# Patient Record
Sex: Male | Born: 1960
Health system: Southern US, Community
[De-identification: ages and names within clinical notes are randomized; demographics above are authoritative.]

## PROBLEM LIST (undated history)

## (undated) DIAGNOSIS — Z87442 Personal history of urinary calculi: Secondary | ICD-10-CM

## (undated) DIAGNOSIS — T7840XA Allergy, unspecified, initial encounter: Secondary | ICD-10-CM

## (undated) DIAGNOSIS — I10 Essential (primary) hypertension: Secondary | ICD-10-CM

## (undated) DIAGNOSIS — E119 Type 2 diabetes mellitus without complications: Secondary | ICD-10-CM

## (undated) HISTORY — DX: Allergy, unspecified, initial encounter: T78.40XA

## (undated) HISTORY — DX: Type 2 diabetes mellitus without complications: E11.9

## (undated) HISTORY — DX: Essential (primary) hypertension: I10

## (undated) HISTORY — PX: APPENDECTOMY: SHX54

## (undated) HISTORY — PX: CHOLECYSTECTOMY: SHX55

---

## 2003-04-15 HISTORY — PX: SMALL INTESTINE SURGERY: SHX150

## 2004-10-09 ENCOUNTER — Inpatient Hospital Stay: Payer: Self-pay | Admitting: General Surgery

## 2004-11-01 ENCOUNTER — Ambulatory Visit: Payer: Self-pay | Admitting: General Surgery

## 2006-09-15 ENCOUNTER — Other Ambulatory Visit: Payer: Self-pay

## 2006-09-15 ENCOUNTER — Emergency Department: Payer: Self-pay | Admitting: Emergency Medicine

## 2007-03-29 ENCOUNTER — Emergency Department: Payer: Self-pay | Admitting: Emergency Medicine

## 2007-05-17 ENCOUNTER — Emergency Department: Payer: Self-pay

## 2009-02-14 ENCOUNTER — Emergency Department: Payer: Self-pay | Admitting: Emergency Medicine

## 2011-07-24 ENCOUNTER — Emergency Department: Payer: Self-pay | Admitting: Internal Medicine

## 2011-07-24 LAB — CBC
HGB: 16.2 g/dL (ref 13.0–18.0)
MCH: 29.1 pg (ref 26.0–34.0)
MCHC: 33.1 g/dL (ref 32.0–36.0)
MCV: 88 fL (ref 80–100)
RBC: 5.55 10*6/uL (ref 4.40–5.90)
RDW: 12.4 % (ref 11.5–14.5)

## 2011-07-24 LAB — BASIC METABOLIC PANEL
Anion Gap: 10 (ref 7–16)
Calcium, Total: 8.6 mg/dL (ref 8.5–10.1)
Co2: 26 mmol/L (ref 21–32)
Creatinine: 0.89 mg/dL (ref 0.60–1.30)
EGFR (Non-African Amer.): >60
Osmolality: 272 (ref 275–301)

## 2011-07-24 LAB — TROPONIN I: Troponin-I: <0.02 ng/mL

## 2012-09-27 ENCOUNTER — Emergency Department: Payer: Self-pay | Admitting: Emergency Medicine

## 2013-04-15 ENCOUNTER — Emergency Department: Payer: Self-pay | Admitting: Emergency Medicine

## 2013-07-26 ENCOUNTER — Emergency Department: Payer: Self-pay | Admitting: Emergency Medicine

## 2013-08-10 ENCOUNTER — Ambulatory Visit: Payer: Self-pay | Admitting: Family Medicine

## 2013-08-25 ENCOUNTER — Ambulatory Visit: Payer: Self-pay | Admitting: Physician Assistant

## 2014-04-26 ENCOUNTER — Emergency Department: Payer: Self-pay | Admitting: Emergency Medicine

## 2014-04-26 ENCOUNTER — Ambulatory Visit: Payer: Self-pay | Admitting: Family Medicine

## 2014-04-26 LAB — COMPREHENSIVE METABOLIC PANEL
ALBUMIN: 3.7 g/dL (ref 3.4–5.0)
AST: 48 U/L — AB (ref 15–37)
Alkaline Phosphatase: 58 U/L
Anion Gap: 4 — ABNORMAL LOW (ref 7–16)
BUN: 10 mg/dL (ref 7–18)
Bilirubin,Total: 0.4 mg/dL (ref 0.2–1.0)
CALCIUM: 9.1 mg/dL (ref 8.5–10.1)
Chloride: 105 mmol/L (ref 98–107)
Co2: 31 mmol/L (ref 21–32)
Creatinine: 0.84 mg/dL (ref 0.60–1.30)
EGFR (African American): >60
GLUCOSE: 96 mg/dL (ref 65–99)
OSMOLALITY: 278 (ref 275–301)
Potassium: 4.3 mmol/L (ref 3.5–5.1)
SGPT (ALT): 59 U/L
Sodium: 140 mmol/L (ref 136–145)
Total Protein: 7.8 g/dL (ref 6.4–8.2)

## 2014-04-26 LAB — CBC
HCT: 47.1 % (ref 40.0–52.0)
HGB: 15.6 g/dL (ref 13.0–18.0)
MCH: 30.4 pg (ref 26.0–34.0)
MCHC: 33.1 g/dL (ref 32.0–36.0)
MCV: 92 fL (ref 80–100)
Platelet: 226 10*3/uL (ref 150–440)
RBC: 5.12 10*6/uL (ref 4.40–5.90)
RDW: 13.1 % (ref 11.5–14.5)
WBC: 9.1 10*3/uL (ref 3.8–10.6)

## 2014-04-26 LAB — TROPONIN I

## 2014-04-26 LAB — CK TOTAL AND CKMB (NOT AT ARMC)
CK, Total: 41 U/L (ref 39–308)
CK-MB: <0.5 ng/mL — ABNORMAL LOW (ref 0.5–3.6)

## 2014-06-27 ENCOUNTER — Ambulatory Visit: Payer: Self-pay | Admitting: Emergency Medicine

## 2014-07-15 ENCOUNTER — Inpatient Hospital Stay
Admit: 2014-07-15 | Disposition: A | Discharge status: Home / Self Care | Payer: Self-pay | Attending: General Surgery | Admitting: General Surgery

## 2014-07-15 DIAGNOSIS — K565 Intestinal adhesions [bands] with obstruction (postprocedural) (postinfection): Secondary | ICD-10-CM

## 2014-07-15 LAB — COMPREHENSIVE METABOLIC PANEL
ALBUMIN: 4.8 g/dL
ALT: 66 U/L — AB
AST: 49 U/L — AB
Alkaline Phosphatase: 59 U/L
Anion Gap: 9 (ref 7–16)
BILIRUBIN TOTAL: 1.1 mg/dL
BUN: 17 mg/dL
Calcium, Total: 10 mg/dL
Chloride: 93 mmol/L — ABNORMAL LOW
Co2: 32 mmol/L
Creatinine: 0.89 mg/dL
EGFR (African American): >60
Glucose: 248 mg/dL — ABNORMAL HIGH
Potassium: 3 mmol/L — ABNORMAL LOW
SODIUM: 134 mmol/L — AB
Total Protein: 8.6 g/dL — ABNORMAL HIGH

## 2014-07-15 LAB — URINALYSIS, COMPLETE
BACTERIA: NONE SEEN
BILIRUBIN, UR: NEGATIVE
BLOOD: NEGATIVE
Ketone: NEGATIVE
Leukocyte Esterase: NEGATIVE
NITRITE: NEGATIVE
PH: 5 (ref 4.5–8.0)
Protein: NEGATIVE
RBC,UR: <1 /HPF (ref 0–5)
Specific Gravity: 1.04 (ref 1.003–1.030)
Squamous Epithelial: <1
WBC UR: 3 /HPF (ref 0–5)

## 2014-07-15 LAB — TROPONIN I: Troponin-I: <0.03 ng/mL

## 2014-07-15 LAB — CBC WITH DIFFERENTIAL/PLATELET
BASOS PCT: 0.2 %
Basophil #: 0 10*3/uL (ref 0.0–0.1)
EOS PCT: 1 %
Eosinophil #: 0.2 10*3/uL (ref 0.0–0.7)
HCT: 49.6 % (ref 40.0–52.0)
HGB: 17.3 g/dL (ref 13.0–18.0)
LYMPHS PCT: 8.9 %
Lymphocyte #: 1.7 10*3/uL (ref 1.0–3.6)
MCH: 30.3 pg (ref 26.0–34.0)
MCHC: 34.9 g/dL (ref 32.0–36.0)
MCV: 87 fL (ref 80–100)
Monocyte #: 0.8 x10 3/mm (ref 0.2–1.0)
Monocyte %: 4.5 %
NEUTROS ABS: 15.8 10*3/uL — AB (ref 1.4–6.5)
NEUTROS PCT: 85.4 %
Platelet: 242 10*3/uL (ref 150–440)
RBC: 5.7 10*6/uL (ref 4.40–5.90)
RDW: 12.9 % (ref 11.5–14.5)
WBC: 18.5 10*3/uL — ABNORMAL HIGH (ref 3.8–10.6)

## 2014-07-15 LAB — LIPASE, BLOOD: Lipase: 21 U/L — ABNORMAL LOW

## 2014-07-16 DIAGNOSIS — K565 Intestinal adhesions [bands] with obstruction (postprocedural) (postinfection): Secondary | ICD-10-CM | POA: Diagnosis not present

## 2014-07-16 LAB — CBC WITH DIFFERENTIAL/PLATELET
Basophil #: 0 10*3/uL (ref 0.0–0.1)
Basophil %: 0.3 %
EOS ABS: 0.3 10*3/uL (ref 0.0–0.7)
EOS PCT: 3.3 %
HCT: 39.9 % — ABNORMAL LOW (ref 40.0–52.0)
HGB: 13.7 g/dL (ref 13.0–18.0)
Lymphocyte #: 2 10*3/uL (ref 1.0–3.6)
Lymphocyte %: 22.3 %
MCH: 30.4 pg (ref 26.0–34.0)
MCHC: 34.3 g/dL (ref 32.0–36.0)
MCV: 89 fL (ref 80–100)
MONO ABS: 0.5 x10 3/mm (ref 0.2–1.0)
Monocyte %: 5.2 %
NEUTROS PCT: 68.9 %
Neutrophil #: 6.2 10*3/uL (ref 1.4–6.5)
Platelet: 177 10*3/uL (ref 150–440)
RBC: 4.5 10*6/uL (ref 4.40–5.90)
RDW: 12.7 % (ref 11.5–14.5)
WBC: 9 10*3/uL (ref 3.8–10.6)

## 2014-07-16 LAB — BASIC METABOLIC PANEL
ANION GAP: 2 — AB (ref 7–16)
BUN: 15 mg/dL
CHLORIDE: 100 mmol/L — AB
Calcium, Total: 7.8 mg/dL — ABNORMAL LOW
Co2: 32 mmol/L
Creatinine: 0.79 mg/dL
Glucose: 138 mg/dL — ABNORMAL HIGH
Potassium: 3.1 mmol/L — ABNORMAL LOW
Sodium: 134 mmol/L — ABNORMAL LOW

## 2014-07-17 ENCOUNTER — Encounter: Payer: Self-pay | Admitting: General Surgery

## 2014-07-17 DIAGNOSIS — R1084 Generalized abdominal pain: Secondary | ICD-10-CM

## 2014-07-17 LAB — COMPREHENSIVE METABOLIC PANEL
ALBUMIN: 3.7 g/dL
ALK PHOS: 44 U/L
AST: 29 U/L
Anion Gap: 7 (ref 7–16)
BUN: 11 mg/dL
Bilirubin,Total: 0.7 mg/dL
CALCIUM: 8.5 mg/dL — AB
CHLORIDE: 101 mmol/L
Co2: 29 mmol/L
Creatinine: 0.82 mg/dL
EGFR (African American): >60
EGFR (Non-African Amer.): >60
Glucose: 100 mg/dL — ABNORMAL HIGH
POTASSIUM: 3.5 mmol/L
SGPT (ALT): 35 U/L
Sodium: 137 mmol/L
TOTAL PROTEIN: 6.6 g/dL

## 2014-07-17 LAB — CBC WITH DIFFERENTIAL/PLATELET
Basophil #: 0 10*3/uL (ref 0.0–0.1)
Basophil %: 0.3 %
Eosinophil #: 0.2 10*3/uL (ref 0.0–0.7)
Eosinophil %: 3.8 %
HCT: 41 % (ref 40.0–52.0)
HGB: 13.7 g/dL (ref 13.0–18.0)
LYMPHS PCT: 31 %
Lymphocyte #: 2 10*3/uL (ref 1.0–3.6)
MCH: 29.6 pg (ref 26.0–34.0)
MCHC: 33.5 g/dL (ref 32.0–36.0)
MCV: 88 fL (ref 80–100)
MONOS PCT: 5.9 %
Monocyte #: 0.4 x10 3/mm (ref 0.2–1.0)
Neutrophil #: 3.8 10*3/uL (ref 1.4–6.5)
Neutrophil %: 59 %
PLATELETS: 193 10*3/uL (ref 150–440)
RBC: 4.64 10*6/uL (ref 4.40–5.90)
RDW: 13 % (ref 11.5–14.5)
WBC: 6.4 10*3/uL (ref 3.8–10.6)

## 2014-07-18 DIAGNOSIS — R1084 Generalized abdominal pain: Secondary | ICD-10-CM | POA: Diagnosis not present

## 2014-07-18 LAB — BASIC METABOLIC PANEL
Anion Gap: 5 — ABNORMAL LOW (ref 7–16)
BUN: 7 mg/dL
CO2: 26 mmol/L
CREATININE: 0.73 mg/dL
Calcium, Total: 8.6 mg/dL — ABNORMAL LOW
Chloride: 104 mmol/L
EGFR (African American): >60
GLUCOSE: 172 mg/dL — AB
POTASSIUM: 4.1 mmol/L
SODIUM: 135 mmol/L

## 2014-07-19 ENCOUNTER — Encounter: Payer: Self-pay | Admitting: *Deleted

## 2014-07-19 DIAGNOSIS — R1084 Generalized abdominal pain: Secondary | ICD-10-CM | POA: Diagnosis not present

## 2014-07-20 LAB — CULTURE, BLOOD (SINGLE)

## 2014-07-27 ENCOUNTER — Encounter: Payer: Self-pay | Admitting: General Surgery

## 2014-07-27 ENCOUNTER — Ambulatory Visit (INDEPENDENT_AMBULATORY_CARE_PROVIDER_SITE_OTHER): Payer: BLUE CROSS/BLUE SHIELD | Admitting: General Surgery

## 2014-07-27 VITALS — BP 130/70 | HR 72 | Resp 12 | Ht 67.0 in | Wt 161.0 lb

## 2014-07-27 DIAGNOSIS — R109 Unspecified abdominal pain: Secondary | ICD-10-CM | POA: Diagnosis not present

## 2014-07-27 NOTE — Progress Notes (Signed)
Patient ID: Scott [name], male   DOB: 01/13/1961, 54 y.o.   MRN: [id number]  Chief Complaint  Patient presents with  . Follow-up    abdominal pain    HPI Scott RackJames Garland Scott [name] is a 54 y.o. male.  Here today for follow up from hospital admission. Patient presented to the ER for mid abdominal pain. CT scan showed edema and venous congestion in the mesentery of the mid portion of the small bowel. No arterial lesions were noted. Patient was treated empirically with IV Invanz followed by oral Augmentin. He has completed this course. Patient states he is doing much better and is not having any abdominal pain, nausea, vomiting. Bowel movements are normal.  HPI  Past Medical History  Diagnosis Date  . Hypertension     Past Surgical History  Procedure Laterality Date  . Cholecystectomy    . Appendectomy      History reviewed. No pertinent family history.  Social History History  Substance Use Topics  . Smoking status: Never Smoker   . Smokeless tobacco: Not on file  . Alcohol Use: 0.0 oz/week    0 Standard drinks or equivalent per week     Comment: occasionally    No Known Allergies  No current outpatient prescriptions on file.   No current facility-administered medications for this visit.    Review of Systems Review of Systems  Constitutional: Negative.   Respiratory: Negative.   Cardiovascular: Negative.     Blood pressure 130/70, pulse 72, resp. rate 12, height 5\' 7"  (1.702 m), weight 161 lb (73.029 kg).  Physical Exam Physical Exam  Constitutional: He is oriented to person, place, and time. He appears well-developed and well-nourished.  HENT:  Head: Normocephalic.  Eyes: Conjunctivae are normal. No scleral icterus.  Neck: Neck supple.  Cardiovascular: Normal rate, regular rhythm and normal heart sounds.   Pulmonary/Chest: Effort normal and breath sounds normal.  Abdominal: Soft. Bowel sounds are normal. There is no tenderness.  Neurological: He is  alert and oriented to person, place, and time.  Skin: Skin is warm and dry.    Data Reviewed Prior notes reviewed.  Assessment    Fully resolved abdominal symptoms. Source of CT findings is still uncertain. It does not necessitate further work up at this time because he is totally asymptomatic now.    Plan    Advised to call for PRN for any recurrence of abdominal pain, nausea, vomiting, or any other bowel problems. Also discussed colon cancer screening. He will think about it.       SANKAR,SEEPLAPUTHUR G 07/28/2014, 8:32 AM

## 2014-07-27 NOTE — Patient Instructions (Signed)
Advised to call for PRN for any recurrence of abdominal pain, nausea, vomiting, or any other bowel problems.

## 2014-07-28 ENCOUNTER — Encounter: Payer: Self-pay | Admitting: General Surgery

## 2014-08-13 NOTE — Discharge Summary (Signed)
PATIENT NAME:  Scott [name], Scott [name] MR#:  [id number] DATE OF BIRTH:  08-18-1960  DATE OF ADMISSION:  07/15/2014 DATE OF DISCHARGE:  07/19/2014   HISTORY OF PRESENT ILLNESS: This 54 year old male presented to the Emergency Room complaining of a partial small bowel obstruction as noted on CT scan. The patient had noted some difficulty in evacuating his bowels and he had taken some laxatives and then he continued to have some symptoms of abdominal discomfort at that time. This was just 2 to 3 weeks prior to admission. [facility] and plain films were unremarkable at that time.  The last 24 to 48 hours before admission, the patient had increasing pain and tenderness with nausea, but no vomiting.  SIGNIFICANT PAST MEDICAL HISTORY:  That for exploration of the abdomen for intestinal obstruction secondary to adhesions related to a long Meckel diverticulum. He underwent resection of this along with this Meckel's along with a segment of small bowel and he subsequently also underwent a laparoscopy and cholecystectomy and had done well up until now.  PHYSICAL EXAMINATION:  Showed mild abdominal distention, but it was relatively soft and nontender.   LABORATORY VALUES:  Showed a white count of 18,000. Chemistries were normal. However, a CT scan showed evidence of some edema in the midportion of the mesentery of the small bowel with some venous congestion. The etiology of this was uncertain. There did not, however, appear to be a definite point of obstruction in the bowel. With this finding and the patient's prior history, it was decided to admit him for management.   COURSE IN THE HOSPITAL:  The patient was placed n.p.o. and on IV fluids and over the next 48 hours the patient seemed to be improving very quickly.  His abdominal pain was subsiding. He had no nausea, but his bowels had not moved as yet. A follow-up KUB on the day after admission showed that the contrast from the CT had reached the  rectum.  On 07/18/2014, the patient was noted to have no complaints. He was feeling a lot better. He has also had 2 to 3 bowel movements 24 hours prior to that.  He had no nausea or vomiting. Abdominal exam was unremarkable. White count had come down to normal. The patient initially was started on IV Invanz empirically for the findings in the mesentery and at this time he was switched to oral Augmentin to be continued as an outpatient.  On April 6, the patient was again in stable condition.  He was tolerating oral intake without any recurrence of abdominal pain, no nausea and bowels were starting to function more normally. He was at that time discharged in stable condition to be followed as an outpatient. He was advised to complete a 5 day course of Augmentin and followup appointment arranged.   FINAL DIAGNOSES:  1.  Abdominal pain 2.  Edema of the mesentery of uncertain nature.   PROCEDURE: CT of the abdomen.   ___________________________ S.Wynona LunaG. Tomeshia Pizzi, MD sgs:sp D: 08/04/2014 08:08:23 ET T: 08/04/2014 09:17:57 ET JOB#: [id number]  cc: Timoteo ExposeS.[name]. Evette CristalSankar, MD, <Dictator> [facility] Wynona LunaG Jolina Symonds MD ELECTRONICALLY SIGNED 08/04/2014 18:29

## 2014-08-13 NOTE — H&P (Signed)
PATIENT NAME:  Scott [name], Scott [name] MR#:  [id number] DATE OF BIRTH:  1960-12-05  DATE OF ADMISSION:  07/15/2014  ADMISSION DIAGNOSES:  Partial small bowel obstruction, abdominal pain, history of essential hypertension.   CLINICAL NOTE:  This 54 year old male was in his usual state of health until the last 3 to 4 weeks when he noticed increasing difficulty evacuating his bowels. He reported that the stools were soft, but he required increasing force during defecation to achieve successful elimination. [facility] mid March 2016 and plain films at that time were unremarkable. He noted over the last 24 to 48 hours increasing pain and tenderness with nausea, but no vomiting. He presented to the Emergency Department and after the mandatory CT had been obtained there was a diagnosis of partial small bowel obstruction. He was admitted initially to observation for further management.   PAST MEDICAL HISTORY: Notable for abdominal exploration in 2005 for intestinal obstruction secondary to adhesions related to a long Meckel's diverticulum. He underwent a segmental resection of the small bowel at that time. In 2006, he underwent a laparoscopic cholecystectomy without incident.   PAST MEDICAL HISTORY: Notable for a 1 to 2 year history of essential hypertension, presently being managed with a diuretic.   ALLERGIES:  The patient has no medical allergies.   PHYSICAL EXAMINATION: At the time of presentation he was afebrile, had a clear cardiopulmonary exam and an unremarkable head and neck exam.  The abdomen showed mild distention. It was soft and nontender. There was a suggestion of a tiny defect at the umbilicus from his previous midline surgery. No tenderness or evidence of entrapped contents. There was no evidence of inguinal or femoral hernias.  The lower extremities were unremarkable. Pedal pulses were intact. The CT scan on admission suggested mesenteric edema with a focal area of  small bowel prominence. Normal arterial flow on review of the films. Gas was present to the rectum.   LABORATORY STUDIES: Notable for a white blood cell count of 18,000 and mild depression of the serum electrolytes including potassium and chloride. His abdominal exam does not suggest acute abdominal emergency.  PLAN:  He will be admitted to the hospital, placed on IV fluids, maintained n.p.o., and maintained on IV antibiotics pending resolution of his leukocytosis.    ____________________________ Earline MayotteJeffrey W. Tanasha Menees, MD jwb:sp D: 07/16/2014 09:51:30 ET T: 07/16/2014 10:08:17 ET JOB#: [id number]  cc: Earline MayotteJeffrey W. Leondre Taul, MD, <Dictator> [facility] Tramel Westbrook Brion AlimentW Azarya Oconnell MD ELECTRONICALLY SIGNED 07/19/2014 22:14

## 2015-09-27 ENCOUNTER — Encounter: Payer: Self-pay | Admitting: Emergency Medicine

## 2015-09-27 ENCOUNTER — Inpatient Hospital Stay
Admission: EM | Admit: 2015-09-27 | Discharge: 2015-10-03 | DRG: 638 | Disposition: A | Discharge status: Home / Self Care | Payer: BLUE CROSS/BLUE SHIELD | Attending: Internal Medicine | Admitting: Internal Medicine

## 2015-09-27 ENCOUNTER — Emergency Department: Payer: BLUE CROSS/BLUE SHIELD

## 2015-09-27 DIAGNOSIS — Z8249 Family history of ischemic heart disease and other diseases of the circulatory system: Secondary | ICD-10-CM

## 2015-09-27 DIAGNOSIS — K565 Intestinal adhesions [bands] with obstruction (postprocedural) (postinfection): Secondary | ICD-10-CM | POA: Diagnosis not present

## 2015-09-27 DIAGNOSIS — I10 Essential (primary) hypertension: Secondary | ICD-10-CM | POA: Diagnosis present

## 2015-09-27 DIAGNOSIS — Z9049 Acquired absence of other specified parts of digestive tract: Secondary | ICD-10-CM | POA: Diagnosis not present

## 2015-09-27 DIAGNOSIS — E1165 Type 2 diabetes mellitus with hyperglycemia: Principal | ICD-10-CM | POA: Diagnosis present

## 2015-09-27 DIAGNOSIS — K5660 Unspecified intestinal obstruction: Secondary | ICD-10-CM | POA: Diagnosis present

## 2015-09-27 DIAGNOSIS — R1114 Bilious vomiting: Secondary | ICD-10-CM

## 2015-09-27 DIAGNOSIS — K56609 Unspecified intestinal obstruction, unspecified as to partial versus complete obstruction: Secondary | ICD-10-CM

## 2015-09-27 DIAGNOSIS — Z833 Family history of diabetes mellitus: Secondary | ICD-10-CM

## 2015-09-27 DIAGNOSIS — R109 Unspecified abdominal pain: Secondary | ICD-10-CM

## 2015-09-27 LAB — DIFFERENTIAL
Band Neutrophils: 0 %
Basophils Absolute: 0 10*3/uL (ref 0–0.1)
Basophils Relative: 0 %
Blasts: 0 %
EOS ABS: 0 10*3/uL (ref 0–0.7)
Eosinophils Relative: 0 %
LYMPHS PCT: 4 %
Lymphs Abs: 0.8 10*3/uL — ABNORMAL LOW (ref 1.0–3.6)
MONO ABS: 0.8 10*3/uL (ref 0.2–1.0)
MONOS PCT: 4 %
Metamyelocytes Relative: 0 %
Myelocytes: 0 %
Neutro Abs: 18.1 10*3/uL — ABNORMAL HIGH (ref 1.4–6.5)
Neutrophils Relative %: 92 %
Other: 0 %
PROMYELOCYTES ABS: 0 %
nRBC: 0 /100 WBC

## 2015-09-27 LAB — CBC
HEMATOCRIT: 51 % (ref 40.0–52.0)
HEMOGLOBIN: 17.6 g/dL (ref 13.0–18.0)
MCH: 30.3 pg (ref 26.0–34.0)
MCHC: 34.5 g/dL (ref 32.0–36.0)
MCV: 88 fL (ref 80.0–100.0)
Platelets: 225 10*3/uL (ref 150–440)
RBC: 5.79 MIL/uL (ref 4.40–5.90)
RDW: 13.1 % (ref 11.5–14.5)
WBC: 19.7 10*3/uL — ABNORMAL HIGH (ref 3.8–10.6)

## 2015-09-27 LAB — URINALYSIS COMPLETE WITH MICROSCOPIC (ARMC ONLY)
BACTERIA UA: NONE SEEN
Bilirubin Urine: NEGATIVE
Glucose, UA: >500 mg/dL — AB
HGB URINE DIPSTICK: NEGATIVE
Leukocytes, UA: NEGATIVE
NITRITE: NEGATIVE
PH: 5 (ref 5.0–8.0)
Protein, ur: NEGATIVE mg/dL
SPECIFIC GRAVITY, URINE: 1.037 — AB (ref 1.005–1.030)
Squamous Epithelial / LPF: NONE SEEN

## 2015-09-27 LAB — COMPREHENSIVE METABOLIC PANEL
ALBUMIN: 4.9 g/dL (ref 3.5–5.0)
ALT: 58 U/L (ref 17–63)
ANION GAP: 12 (ref 5–15)
AST: 38 U/L (ref 15–41)
Alkaline Phosphatase: 71 U/L (ref 38–126)
BILIRUBIN TOTAL: 1.4 mg/dL — AB (ref 0.3–1.2)
BUN: 16 mg/dL (ref 6–20)
CALCIUM: 9.6 mg/dL (ref 8.9–10.3)
CO2: 27 mmol/L (ref 22–32)
Chloride: 97 mmol/L — ABNORMAL LOW (ref 101–111)
Creatinine, Ser: 0.8 mg/dL (ref 0.61–1.24)
GFR calc non Af Amer: >60 mL/min (ref >60)
GLUCOSE: 341 mg/dL — AB (ref 65–99)
POTASSIUM: 4.2 mmol/L (ref 3.5–5.1)
SODIUM: 136 mmol/L (ref 135–145)
TOTAL PROTEIN: 8.7 g/dL — AB (ref 6.5–8.1)

## 2015-09-27 LAB — GLUCOSE, CAPILLARY
GLUCOSE-CAPILLARY: 147 mg/dL — AB (ref 65–99)
GLUCOSE-CAPILLARY: 176 mg/dL — AB (ref 65–99)
GLUCOSE-CAPILLARY: 209 mg/dL — AB (ref 65–99)

## 2015-09-27 LAB — LIPASE, BLOOD: Lipase: 18 U/L (ref 11–51)

## 2015-09-27 LAB — HEMOGLOBIN A1C: Hgb A1c MFr Bld: 8.7 % — ABNORMAL HIGH (ref 4.0–6.0)

## 2015-09-27 MED ORDER — LACTATED RINGERS IV SOLN
INTRAVENOUS | Status: DC
Start: 2015-09-27 — End: 2015-09-30
  Administered 2015-09-27 – 2015-09-30 (×8): via INTRAVENOUS

## 2015-09-27 MED ORDER — MORPHINE SULFATE (PF) 2 MG/ML IV SOLN
2.0000 mg | INTRAVENOUS | Status: DC | PRN
  Administered 2015-09-27 – 2015-10-03 (×6): 2 mg via INTRAVENOUS
  Filled 2015-09-27 (×6): qty 1

## 2015-09-27 MED ORDER — IOPAMIDOL (ISOVUE-300) INJECTION 61%
100.0000 mL | Freq: Once | INTRAVENOUS | Status: AC | PRN
  Administered 2015-09-27: 100 mL via INTRAVENOUS

## 2015-09-27 MED ORDER — ONDANSETRON HCL 4 MG PO TABS: 4.0000 mg | ORAL_TABLET | Freq: Four times a day (QID) | ORAL | Status: DC | PRN

## 2015-09-27 MED ORDER — ENOXAPARIN SODIUM 40 MG/0.4ML ~~LOC~~ SOLN
40.0000 mg | SUBCUTANEOUS | Status: DC
  Administered 2015-09-27 – 2015-10-02 (×6): 40 mg via SUBCUTANEOUS
  Filled 2015-09-27 (×6): qty 0.4

## 2015-09-27 MED ORDER — ONDANSETRON HCL 4 MG/2ML IJ SOLN: 4.0000 mg | Freq: Four times a day (QID) | INTRAMUSCULAR | Status: DC | PRN

## 2015-09-27 MED ORDER — SODIUM CHLORIDE 0.9 % IV BOLUS (SEPSIS)
1000.0000 mL | Freq: Once | INTRAVENOUS | Status: AC
  Administered 2015-09-27: 1000 mL via INTRAVENOUS

## 2015-09-27 MED ORDER — INSULIN ASPART 100 UNIT/ML ~~LOC~~ SOLN
0.0000 [IU] | Freq: Three times a day (TID) | SUBCUTANEOUS | Status: DC
  Administered 2015-09-27: 3 [IU] via SUBCUTANEOUS
  Filled 2015-09-27: qty 3

## 2015-09-27 MED ORDER — DIATRIZOATE MEGLUMINE & SODIUM 66-10 % PO SOLN
15.0000 mL | Freq: Once | ORAL | Status: AC
  Administered 2015-09-27: 15 mL via ORAL

## 2015-09-27 MED ORDER — MORPHINE SULFATE (PF) 4 MG/ML IV SOLN
4.0000 mg | Freq: Once | INTRAVENOUS | Status: AC
  Administered 2015-09-27: 4 mg via INTRAVENOUS
  Filled 2015-09-27: qty 1

## 2015-09-27 MED ORDER — ONDANSETRON HCL 4 MG/2ML IJ SOLN
4.0000 mg | Freq: Once | INTRAMUSCULAR | Status: AC
  Administered 2015-09-27: 4 mg via INTRAVENOUS
  Filled 2015-09-27: qty 2

## 2015-09-27 MED ORDER — INSULIN ASPART 100 UNIT/ML ~~LOC~~ SOLN: 0.0000 [IU] | Freq: Every day | SUBCUTANEOUS | Status: DC

## 2015-09-27 MED ORDER — INSULIN ASPART 100 UNIT/ML ~~LOC~~ SOLN
0.0000 [IU] | Freq: Four times a day (QID) | SUBCUTANEOUS | Status: DC
  Administered 2015-09-27 – 2015-09-28 (×2): 1 [IU] via SUBCUTANEOUS
  Administered 2015-09-28: 3 [IU] via SUBCUTANEOUS
  Administered 2015-09-28 – 2015-09-29 (×4): 2 [IU] via SUBCUTANEOUS
  Administered 2015-09-29 – 2015-09-30 (×2): 1 [IU] via SUBCUTANEOUS
  Administered 2015-09-30 (×2): 2 [IU] via SUBCUTANEOUS
  Administered 2015-09-30: 1 [IU] via SUBCUTANEOUS
  Administered 2015-10-01 (×3): 2 [IU] via SUBCUTANEOUS
  Administered 2015-10-02: 1 [IU] via SUBCUTANEOUS
  Administered 2015-10-02 – 2015-10-03 (×5): 2 [IU] via SUBCUTANEOUS
  Filled 2015-09-27: qty 1
  Filled 2015-09-27 (×2): qty 2
  Filled 2015-09-27: qty 1
  Filled 2015-09-27: qty 3
  Filled 2015-09-27: qty 1
  Filled 2015-09-27: qty 2
  Filled 2015-09-27: qty 1
  Filled 2015-09-27 (×4): qty 2
  Filled 2015-09-27: qty 1
  Filled 2015-09-27: qty 2
  Filled 2015-09-27: qty 1
  Filled 2015-09-27 (×6): qty 2

## 2015-09-27 MED ORDER — SODIUM CHLORIDE 0.9 % IV SOLN
INTRAVENOUS | Status: DC
  Administered 2015-09-27: 11:00:00 via INTRAVENOUS

## 2015-09-27 NOTE — ED Provider Notes (Signed)
[facility] Emergency Department Provider Note    ____________________________________________  Time seen: ~0715  I have reviewed the triage vital signs and the nursing notes.   HISTORY  Chief Complaint Emesis and Abdominal Pain   History limited by: Not Limited   HPI Scott [name] is a 55 y.o. male with history of cholecystitis and appendicitis are presents to the emergency department today because of concerns for abdominal pain, distention, vomiting. Patient states that the symptoms started last night. The pain is somewhat diffuse. It has been persistent since started. It has been accompanied by vomiting. He states he is vomiting up fluid. He is not noticing any blood. In addition the patient states it has been almost one week since his last bowel movement. He denies any change to his most recent bowel movements. Has been passing some gas. Denies any fevers.   Past Medical History  Diagnosis Date  . Hypertension     There are no active problems to display for this patient.   Past Surgical History  Procedure Laterality Date  . Cholecystectomy    . Appendectomy      No current outpatient prescriptions on file.  Allergies Review of patient's allergies indicates no known allergies.  No family history on file.  Social History Social History  Substance Use Topics  . Smoking status: Never Smoker   . Smokeless tobacco: None  . Alcohol Use: 0.0 oz/week    0 Standard drinks or equivalent per week     Comment: occasionally    Review of Systems  Constitutional: Negative for fever. Cardiovascular: Negative for chest pain. Respiratory: Negative for shortness of breath. Gastrointestinal: Positive for abdominal pain, vomiting. Positive for constipation. Neurological: Negative for headaches, focal weakness or numbness.  10-point ROS otherwise negative.  ____________________________________________   PHYSICAL EXAM:  VITAL  SIGNS: ED Triage Vitals  Enc Vitals Group     BP 09/27/15 0650 128/98 mmHg     Pulse Rate 09/27/15 0650 105     Resp 09/27/15 0650 18     Temp 09/27/15 0650 98.7 F (37.1 C)     Temp Source 09/27/15 0650 Oral     SpO2 09/27/15 0650 98 %     Weight 09/27/15 0650 170 lb (77.111 kg)     Height 09/27/15 0650  (1.702 m)     Head Cir --      Peak Flow --      Pain Score 09/27/15 0651 5   Constitutional: Alert and oriented. Well appearing and in no distress. Eyes: Conjunctivae are normal. PERRL. Normal extraocular movements. ENT   Head: Normocephalic and atraumatic.   Nose: No congestion/rhinnorhea.   Mouth/Throat: Mucous membranes are moist.   Neck: No stridor. Hematological/Lymphatic/Immunilogical: No cervical lymphadenopathy. Cardiovascular: Tachycardic, regular rhythm.  No murmurs, rubs, or gallops. Respiratory: Normal respiratory effort without tachypnea nor retractions. Breath sounds are clear and equal bilaterally. No wheezes/rales/rhonchi. Gastrointestinal: Distended, mildly tympanitic. Somewhat diffusely tender to palpation however slightly worse on the right side. Genitourinary: Deferred Musculoskeletal: Normal range of motion in all extremities. No joint effusions.  No lower extremity tenderness nor edema. Neurologic:  Normal speech and language. No gross focal neurologic deficits are appreciated.  Skin:  Skin is warm, dry and intact. No rash noted. Psychiatric: Mood and affect are normal. Speech and behavior are normal. Patient exhibits appropriate insight and judgment.  ____________________________________________    LABS (pertinent positives/negatives)  Labs Reviewed  COMPREHENSIVE METABOLIC PANEL - Abnormal; Notable for the following:  Chloride 97 (*)    Glucose, Bld 341 (*)    Total Protein 8.7 (*)    Total Bilirubin 1.4 (*)    All other components within normal limits  CBC - Abnormal; Notable for the following:    WBC 19.7 (*)    All other  components within normal limits  URINALYSIS COMPLETEWITH MICROSCOPIC ([facility] ONLY) - Abnormal; Notable for the following:    Color, Urine YELLOW (*)    APPearance CLEAR (*)    Glucose, UA >500 (*)    Ketones, ur 2+ (*)    Specific Gravity, Urine 1.037 (*)    All other components within normal limits  LIPASE, BLOOD     ____________________________________________   EKG  I, Phineas SemenGraydon Emmilee Reamer, attending physician, personally viewed and interpreted this EKG  EKG Time: 0704 Rate: 106 Rhythm: sinus tachycardia Axis: normal Intervals: qtc 488 QRS: narrow ST changes: no st elevation Impression: sinus tachycardia  ____________________________________________    RADIOLOGY  CT abd/pel IMPRESSION: 1. Postsurgical changes are noted in distal small bowel in right lower quadrant. There is mild dilatation of small bowel at the anastomosis site with some fecal like material please see axial image 53. A small fecalith is noted within small bowel lumen axial image 49 measures 8 mm. Beyond this point the small bowel is smaller caliber. The terminal ileum is decompressed small in caliber. Findings are highly suspicious for at least partial small bowel obstruction. 2. No hydronephrosis or hydroureter. 3. No evidence of colitis or diverticulitis. 4. Degenerative changes thoracolumbar spine. 5. Fatty infiltration of the liver. 6. Status post cholecystectomy. 7. Stable cyst in upper pole of the left kidney measures 3.3 cm.  ____________________________________________   PROCEDURES  Procedure(s) performed: None  Critical Care performed: No  ____________________________________________   INITIAL IMPRESSION / ASSESSMENT AND PLAN / ED COURSE  Pertinent labs & imaging results that were available during my care of the patient were reviewed by me and considered in my medical decision making (see chart for details).  Patient presents to the emergency department today because of  concerns for abdominal pain, distention and vomiting. Additionally patient has been constipated for roughly 1 week. Doing consolation of symptoms of concern for small bowel obstruction. Will obtain CT scan.  ----------------------------------------- 9:16 AM on 09/27/2015 -----------------------------------------  CT scan concerning for partial small bowel obstruction. Will discuss with surgery.  ____________________________________________   FINAL CLINICAL IMPRESSION(S) / ED DIAGNOSES  Final diagnoses:  Abdominal pain, unspecified abdominal location     Note: This dictation was prepared with Dragon dictation. Any transcriptional errors that result from this process are unintentional    Phineas SemenGraydon Gavriella Hearst, MD 09/27/15 1330

## 2015-09-27 NOTE — ED Notes (Signed)
Patient presents to ED with c/o vomiting last evening and today, reports feels like his stomach is bloated. Patient denies blood in emesis. Patient denies urinary symptoms. Reports LBM was 09/21/15, patient reports took milk of magnesium without relief. Patient denies chest pain or shortness of breath. Pt a&o x 4, no increased work in breathing noted.

## 2015-09-27 NOTE — ED Notes (Signed)
MD at bedside., admitting Dr.Mody

## 2015-09-27 NOTE — H&P (Signed)
Scott [name] is an 55 y.o. male.   Chief Complaint:  Patient reports nausea/vomiting/abdominal pain. HPI:   The patient was in his usual health until 8 PM last night when he noted nausea. This was after a normal meal of hamburger and potatoes. He had felt well all day and had worked without difficulty. Vomiting began about 11 PM and lasted until 4 AM. The abdominal pain abated after he had finished vomiting. He presented for assessment.  The patient was evident with similar symptoms April 2016. No etiology   Past Medical History  Diagnosis Date  . Hypertension     Past Surgical History  Procedure Laterality Date  . Cholecystectomy    . Appendectomy      No family history on file. Social History:  reports that he has never smoked. He does not have any smokeless tobacco history on file. He reports that he drinks alcohol. He reports that he does not use illicit drugs.  Allergies: No Known Allergies   (Not in a hospital admission)  Results for orders placed or performed during the hospital encounter of 09/27/15 (from the past 48 hour(s))  Lipase, blood     Status: None   Collection Time: 09/27/15  6:54 AM  Result Value Ref Range   Lipase 18 11 - 51 U/L  Comprehensive metabolic panel     Status: Abnormal   Collection Time: 09/27/15  6:54 AM  Result Value Ref Range   Sodium 136 135 - 145 mmol/L   Potassium 4.2 3.5 - 5.1 mmol/L   Chloride 97 (L) 101 - 111 mmol/L   CO2 27 22 - 32 mmol/L   Glucose, Bld 341 (H) 65 - 99 mg/dL   BUN 16 6 - 20 mg/dL   Creatinine, Ser 0.80 0.61 - 1.24 mg/dL   Calcium 9.6 8.9 - 10.3 mg/dL   Total Protein 8.7 (H) 6.5 - 8.1 g/dL   Albumin 4.9 3.5 - 5.0 g/dL   AST 38 15 - 41 U/L   ALT 58 17 - 63 U/L   Alkaline Phosphatase 71 38 - 126 U/L   Total Bilirubin 1.4 (H) 0.3 - 1.2 mg/dL   GFR calc non Af Amer >60 >60 mL/min   GFR calc Af Amer >60 >60 mL/min    Comment: (NOTE) The eGFR has been calculated using the CKD EPI equation. This  calculation has not been validated in all clinical situations. eGFR's persistently <60 mL/min signify possible Chronic Kidney Disease.    Anion gap 12 5 - 15  CBC     Status: Abnormal   Collection Time: 09/27/15  6:54 AM  Result Value Ref Range   WBC 19.7 (H) 3.8 - 10.6 K/uL   RBC 5.79 4.40 - 5.90 MIL/uL   Hemoglobin 17.6 13.0 - 18.0 g/dL   HCT 51.0 40.0 - 52.0 %   MCV 88.0 80.0 - 100.0 fL   MCH 30.3 26.0 - 34.0 pg   MCHC 34.5 32.0 - 36.0 g/dL   RDW 13.1 11.5 - 14.5 %   Platelets 225 150 - 440 K/uL  Urinalysis complete, with microscopic     Status: Abnormal   Collection Time: 09/27/15  6:55 AM  Result Value Ref Range   Color, Urine YELLOW (A) YELLOW   APPearance CLEAR (A) CLEAR   Glucose, UA >500 (A) NEGATIVE mg/dL   Bilirubin Urine NEGATIVE NEGATIVE   Ketones, ur 2+ (A) NEGATIVE mg/dL   Specific Gravity, Urine 1.037 (H) 1.005 - 1.030   Hgb  urine dipstick NEGATIVE NEGATIVE   pH 5.0 5.0 - 8.0   Protein, ur NEGATIVE NEGATIVE mg/dL   Nitrite NEGATIVE NEGATIVE   Leukocytes, UA NEGATIVE NEGATIVE   RBC / HPF 0-5 0 - 5 RBC/hpf   WBC, UA 0-5 0 - 5 WBC/hpf   Bacteria, UA NONE SEEN NONE SEEN   Squamous Epithelial / LPF NONE SEEN NONE SEEN   Mucous PRESENT    Ct Abdomen Pelvis W Contrast  09/27/2015  CLINICAL DATA:  Vomiting last night. Abdominal bloating, history of bowel obstruction, status post appendectomy, status post cholecystectomy EXAM: CT ABDOMEN AND PELVIS WITH CONTRAST TECHNIQUE: Multidetector CT imaging of the abdomen and pelvis was performed using the standard protocol following bolus administration of intravenous contrast. CONTRAST:  165m ISOVUE-300 IOPAMIDOL (ISOVUE-300) INJECTION 61% COMPARISON:  07/15/2014 and 05/17/2007 FINDINGS: Lower chest:  The lung bases are unremarkable. Hepatobiliary: Mild fatty infiltration of the liver. Status postcholecystectomy. No focal hepatic mass. Pancreas: Enhanced pancreas is unremarkable. Spleen: Enhanced spleen is unremarkable.  Adrenals/Urinary Tract: No adrenal gland mass. Enhanced kidneys are symmetrical in size. Again noted cyst in upper pole of the left kidney measures 3.3 cm. No hydronephrosis or hydroureter. Delayed renal images shows bilateral renal symmetrical excretion. Bilateral visualized proximal ureter is unremarkable. The urinary bladder is under distended grossly unremarkable. Stomach/Bowel: Some oral contrast material noted within proximal small bowel and stomach. There are distended mid abdominal small bowel loops with fluid and air-fluid levels. Postsurgical changes are noted in distal small bowel in right lower quadrant. There is mild dilatation of small bowel at the anastomosis site with some fecal like material please see axial image 53. A small fecalith is noted within small bowel lumen axial image 49 measures 8 mm. Beyond this point the small bowel is smaller caliber. The terminal ileum is decompressed small in caliber. Findings are highly suspicious for at least partial small bowel obstruction. Some liquid stool noted in right colon. Some colonic stool noted in descending colon and sigmoid colon. No distal colonic obstruction. No colitis or diverticulitis. Vascular/Lymphatic: No aortic aneurysm. Small nonspecific mesenteric lymph nodes are noted. No retroperitoneal adenopathy. Reproductive: No pelvic mass. Prostate gland and seminal vesicles are unremarkable. Other: No free abdominal air. Tiny amount of free fluid noted in posterior pelvis. Musculoskeletal: No destructive bony lesions are noted. Sagittal images of the spine shows degenerative changes thoracolumbar spine. Significant disc space flattening with vacuum disc phenomenon mild anterior and mild posterior spurring at L5-S1 level. IMPRESSION: 1. Postsurgical changes are noted in distal small bowel in right lower quadrant. There is mild dilatation of small bowel at the anastomosis site with some fecal like material please see axial image 53. A small fecalith  is noted within small bowel lumen axial image 49 measures 8 mm. Beyond this point the small bowel is smaller caliber. The terminal ileum is decompressed small in caliber. Findings are highly suspicious for at least partial small bowel obstruction. 2. No hydronephrosis or hydroureter. 3. No evidence of colitis or diverticulitis. 4. Degenerative changes thoracolumbar spine. 5. Fatty infiltration of the liver. 6. Status post cholecystectomy. 7. Stable cyst in upper pole of the left kidney measures 3.3 cm. Electronically Signed   By: LLahoma CrockerM.D.   On: 09/27/2015 08:43    Review of Systems  Constitutional: Negative.   HENT: Negative.   Eyes: Negative.   Cardiovascular: Negative.   Gastrointestinal: Positive for nausea, vomiting and abdominal pain.  Genitourinary: Negative.   Musculoskeletal: Negative.   Skin: Negative.  Neurological: Negative.   Endo/Heme/Allergies: Negative.   Psychiatric/Behavioral: Negative.     Blood pressure 133/91, pulse 106, temperature 98.7 F (37.1 C), temperature source Oral, resp. rate 18, height '5\' 7"'$  (1.702 m), weight 170 lb (77.111 kg), SpO2 97 %. Physical Exam  Constitutional: He appears well-developed and well-nourished.  HENT:  Head: Normocephalic.  Eyes: Conjunctivae are normal.  Neck: Normal range of motion. Neck supple. No thyromegaly present.  Cardiovascular: Regular rhythm.   Respiratory: Effort normal and breath sounds normal.  GI: Bowel sounds are normal.    Musculoskeletal: Normal range of motion.  Neurological: He is alert. He has normal reflexes.  Skin: Skin is warm and dry.  Psychiatric: He has a normal mood and affect. His behavior is normal. Judgment and thought content normal.     Assessment/Plan   1) marked glucose intolerance, no previous history of diabetes.  2) recurrent partial small bowel obstruction.  Plan: Admission, IV fluids, NG, med consult re: diabetes.  Robert Bellow, MD 09/27/2015, 10:31 AM

## 2015-09-27 NOTE — Consult Note (Signed)
Medical Consultation  Scott RackJames Garland Fierro III ZOX:[id number] DOB: Aug 27, 1960 DOA: 09/27/2015 PCP: No PCP Per Patient   Requesting physician: Dr. Birdie SonsByrnette Date of consultation: September 27 2015 Reason for consultation: elevated blood sugar  Impression/Recommendations  55 year old male with partial small bowel obstruction and hyperglycemia.  1. Hyperglycemia: Patient does not have history of diabetes. Check hemoglobin A1c. Sliding scale insulin with every 6 hours CBG monitoring. Diabetes coordinator consult.  2. Partial small bowel obstruction, recurrent: possibly from adhesions  Management per surgery. Monitor electrolytes. Start IVF Continue NGT.   3. History of hypertension: Patient is not taking medications. Blood pressure is acceptable at this time without medications. Continue to monitor.   Chief Complaint: Abdominal pain with nausea  HPI:   55 year old male with a history of hypertension not on any medications who presented with abdominal pain and nausea since last night. Patient had several episodes of vomiting that lasted through the morning. He continued to have abdominal pain so presented to the ER for further evaluation. He was admitted with similar symptoms in April 2016. CT scan shows partial small bowel obstruction. Hospital service is consulted for medical management for hyper glycemia.   Review of Systems  Constitutional: Negative for fever, chills weight loss HENT: Negative for ear pain, nosebleeds, congestion, facial swelling, rhinorrhea, neck pain, neck stiffness and ear discharge.   Respiratory: Negative for cough, shortness of breath, wheezing  Cardiovascular: Negative for chest pain, palpitations and leg swelling.  Gastrointestinal: Negative for heartburn, positive for abdominal pain, vomiting, no diarrhea or constipation, No flatulence Genitourinary: Negative for dysuria, urgency, , hematuria Musculoskeletal: Negative for back pain or joint  pain Neurological: Negative for dizziness, seizures, syncope, focal weakness,  numbness and headaches.  Hematological: Does not bruise/bleed easily.  Psychiatric/Behavioral: Negative for hallucinations, confusion, dysphoric mood Endocrine: Positive for polydipsia and polyuria  Past Medical History  Diagnosis Date  . Hypertension    Past Surgical History  Procedure Laterality Date  . Cholecystectomy    . Appendectomy     Social History:No tobacco, occasional alcohol and no IV drug use     No Known Allergies  Family history: Positive for diabetes and hypertension  Prior to Admission medications    none    Physical Exam: Blood pressure 111/80, pulse 99, temperature 98.7 F (37.1 C), temperature source Oral, resp. rate 18, height 5\' 7"  (1.702 m), weight 77.111 kg (170 lb), SpO2 97 %. @VITALS2 @ [employer]   09/27/15 0650  Weight: 77.111 kg (170 lb)   No intake or output data in the 24 hours ending 09/27/15 1048   Constitutional: Appears well-developed and well-nourished. No distress. HENT: Normocephalic. Marland Kitchen. Oropharynx is clear and moist.  Eyes: Conjunctivae Normal left eye laterally deviated at baseline . no scleral icterus.  Neck: Normal ROM. Neck supple. No JVD. No tracheal deviation. CVS: RRR, S1/S2 +, no murmurs, no gallops, no carotid bruit.  Pulmonary: Effort and breath sounds normal, no stridor, rhonchi, wheezes, rales.  Abdominal: Significant distention with hypoactive bowel sounds no rebound or guarding.  Musculoskeletal: Normal range of motion. No edema and no tenderness.  Neuro: Alert. CN 2-12 grossly intact. No focal deficits. Skin: Skin is warm and dry. No rash noted. Psychiatric: Normal mood and affect.    Labs  Basic Metabolic Panel:  Recent Labs Lab 09/27/15 0654  NA 136  K 4.2  CL 97*  CO2 27  GLUCOSE 341*  BUN 16  CREATININE 0.80  CALCIUM 9.6   Liver Function Tests:  Recent Labs Lab 09/27/15  0654  AST 38  ALT 58  ALKPHOS 71   BILITOT 1.4*  PROT 8.7*  ALBUMIN 4.9    Recent Labs Lab 09/27/15 0654  LIPASE 18    CBC:  Recent Labs Lab 09/27/15 0654  WBC 19.7*  HGB 17.6  HCT 51.0  MCV 88.0  PLT 225   Cardiac Enzymes: No results for input(s): CKTOTAL, CKMB, CKMBINDEX, TROPONINI in the last 168 hours. BNP: Invalid input(s): POCBNP CBG: No results for input(s): GLUCAP in the last 168 hours.  Radiological Exams: Ct Abdomen Pelvis W Contrast  09/27/2015  CLINICAL DATA:  Vomiting last night. Abdominal bloating, history of bowel obstruction, status post appendectomy, status post cholecystectomy EXAM: CT ABDOMEN AND PELVIS WITH CONTRAST TECHNIQUE: Multidetector CT imaging of the abdomen and pelvis was performed using the standard protocol following bolus administration of intravenous contrast. CONTRAST:  ISOVUE-300 IOPAMIDOL (ISOVUE-300) INJECTION 61% COMPARISON:  07/15/2014 and 05/17/2007 FINDINGS: Lower chest:  The lung bases are unremarkable. Hepatobiliary: Mild fatty infiltration of the liver. Status postcholecystectomy. No focal hepatic mass. Pancreas: Enhanced pancreas is unremarkable. Spleen: Enhanced spleen is unremarkable. Adrenals/Urinary Tract: No adrenal gland mass. Enhanced kidneys are symmetrical in size. Again noted cyst in upper pole of the left kidney measures 3.3 cm. No hydronephrosis or hydroureter. Delayed renal images shows bilateral renal symmetrical excretion. Bilateral visualized proximal ureter is unremarkable. The urinary bladder is under distended grossly unremarkable. Stomach/Bowel: Some oral contrast material noted within proximal small bowel and stomach. There are distended mid abdominal small bowel loops with fluid and air-fluid levels. Postsurgical changes are noted in distal small bowel in right lower quadrant. There is mild dilatation of small bowel at the anastomosis site with some fecal like material please see axial image 53. A small fecalith is noted within small bowel lumen  axial image 49 measures 8 mm. Beyond this point the small bowel is smaller caliber. The terminal ileum is decompressed small in caliber. Findings are highly suspicious for at least partial small bowel obstruction. Some liquid stool noted in right colon. Some colonic stool noted in descending colon and sigmoid colon. No distal colonic obstruction. No colitis or diverticulitis. Vascular/Lymphatic: No aortic aneurysm. Small nonspecific mesenteric lymph nodes are noted. No retroperitoneal adenopathy. Reproductive: No pelvic mass. Prostate gland and seminal vesicles are unremarkable. Other: No free abdominal air. Tiny amount of free fluid noted in posterior pelvis. Musculoskeletal: No destructive bony lesions are noted. Sagittal images of the spine shows degenerative changes thoracolumbar spine. Significant disc space flattening with vacuum disc phenomenon mild anterior and mild posterior spurring at L5-S1 level. IMPRESSION: 1. Postsurgical changes are noted in distal small bowel in right lower quadrant. There is mild dilatation of small bowel at the anastomosis site with some fecal like material please see axial image 53. A small fecalith is noted within small bowel lumen axial image 49 measures 8 mm. Beyond this point the small bowel is smaller caliber. The terminal ileum is decompressed small in caliber. Findings are highly suspicious for at least partial small bowel obstruction. 2. No hydronephrosis or hydroureter. 3. No evidence of colitis or diverticulitis. 4. Degenerative changes thoracolumbar spine. 5. Fatty infiltration of the liver. 6. Status post cholecystectomy. 7. Stable cyst in upper pole of the left kidney measures 3.3 cm. Electronically Signed   By: Natasha Mead M.D.   On: 09/27/2015 08:43    EKG:  Sinus tachycardia no ST elevation or depression   Thank you for allowing me to participate in the care of your patient.  We will continue to follow.   Note: This dictation was prepared with Dragon  dictation along with smaller phrase technology. Any transcriptional errors that result from this process are unintentional.  Time spent: 45 minutes  Journie Howson, MD

## 2015-09-27 NOTE — Progress Notes (Signed)
Per Dr. Evette CristalSankar okay to discontinue order for saline and keep LR. Also cahnge sliding scale order to q6 as pt is NPO.

## 2015-09-27 NOTE — ED Notes (Signed)
MD at bedside. 

## 2015-09-27 NOTE — ED Notes (Signed)
Consult physician at bedside

## 2015-09-27 NOTE — Progress Notes (Signed)
Differential obtained on a.m. sample: Segs 92, lymphs 4, monos 4.

## 2015-09-27 NOTE — Progress Notes (Signed)
Inpatient Diabetes Program Recommendations  AACE/ADA: New Consensus Statement on Inpatient Glycemic Control (2015)  Target Ranges:  Prepandial:   less than 140 mg/dL      Peak postprandial:   less than 180 mg/dL (1-2 hours)      Critically ill patients:  140 - 180 mg/dL   Lab Results  Component Value Date   GLUCAP 209* 09/27/2015    Review of Glycemic Control  Results for Scott [name], Scott [name] (MRN [id number]) as of 09/27/2015 13:28  Ref. Range 09/27/2015 11:13  Glucose-Capillary Latest Ref Range: 65-99 mg/dL 409209 (H)    Inpatient Diabetes Program Recommendations:  Consult to diabetes coordinator noted.  A1C pending.  Will await results and diagnosis of diabetes (if appropriate) by MD before meeting with patient.  Agree with current order for CBG and Novolog correction insulin ordered tid and hs.    Susette RacerJulie Salli Bodin, RN, BA, MHA, CDE Diabetes Coordinator Inpatient Diabetes Program  [phone] (Team Pager) [phone] [facility] Office) 09/27/2015 1:30 PM

## 2015-09-28 ENCOUNTER — Inpatient Hospital Stay: Payer: BLUE CROSS/BLUE SHIELD

## 2015-09-28 DIAGNOSIS — K565 Intestinal adhesions [bands] with obstruction (postprocedural) (postinfection): Secondary | ICD-10-CM

## 2015-09-28 LAB — CBC
HCT: 45.7 % (ref 40.0–52.0)
Hemoglobin: 15.4 g/dL (ref 13.0–18.0)
MCH: 30.8 pg (ref 26.0–34.0)
MCHC: 33.7 g/dL (ref 32.0–36.0)
MCV: 91.3 fL (ref 80.0–100.0)
PLATELETS: 193 10*3/uL (ref 150–440)
RBC: 5.01 MIL/uL (ref 4.40–5.90)
RDW: 13.1 % (ref 11.5–14.5)
WBC: 11.8 10*3/uL — ABNORMAL HIGH (ref 3.8–10.6)

## 2015-09-28 LAB — GLUCOSE, CAPILLARY
Glucose-Capillary: 223 mg/dL — ABNORMAL HIGH (ref 65–99)
Glucose-Capillary: 154 mg/dL — ABNORMAL HIGH (ref 65–99)

## 2015-09-28 MED ORDER — HYDRALAZINE HCL 20 MG/ML IJ SOLN
10.0000 mg | Freq: Four times a day (QID) | INTRAMUSCULAR | Status: DC | PRN
  Administered 2015-09-28 – 2015-09-29 (×2): 10 mg via INTRAVENOUS
  Filled 2015-09-28 (×2): qty 1

## 2015-09-28 MED ORDER — PHENOL 1.4 % MT LIQD
1.0000 | OROMUCOSAL | Status: DC | PRN
  Administered 2015-09-28 (×2): 1 via OROMUCOSAL
  Filled 2015-09-28: qty 177

## 2015-09-28 MED ORDER — BISACODYL 10 MG RE SUPP
10.0000 mg | Freq: Once | RECTAL | Status: AC
  Administered 2015-09-28: 10 mg via RECTAL
  Filled 2015-09-28: qty 1

## 2015-09-28 MED ORDER — LIVING WELL WITH DIABETES BOOK
Freq: Once | Status: AC
  Administered 2015-09-28: 14:00:00
  Filled 2015-09-28: qty 1

## 2015-09-28 NOTE — Progress Notes (Signed)
[facility] [place] [facility]   PATIENT NAME: Scott NighJames Lara    MR#:  [id number]  DATE OF BIRTH:  10-17-1960  SUBJECTIVE:  CHIEF COMPLAINT:   Chief Complaint  Patient presents with  . Emesis  . Abdominal Pain   Afebrile. Has some nausea but no vomiting. NG tube in place with intermittent suction. Patient was not aware of diabetes. He had blood work done 1 year back. Family history of diabetes.  No vision changes or tingling or numbness in extremities.  REVIEW OF SYSTEMS:    Review of Systems  Constitutional: Positive for malaise/fatigue. Negative for fever and chills.  Eyes: Negative for blurred vision, double vision and pain.  Respiratory: Negative for cough, hemoptysis, shortness of breath and wheezing.   Cardiovascular: Negative for chest pain, palpitations, orthopnea and leg swelling.  Gastrointestinal: Positive for nausea, abdominal pain and constipation. Negative for heartburn, vomiting and diarrhea.  Genitourinary: Negative for dysuria and hematuria.  Musculoskeletal: Negative for back pain and joint pain.  Neurological: Positive for weakness. Negative for sensory change and focal weakness.    DRUG ALLERGIES:  No Known Allergies  VITALS:  Blood pressure 164/96, pulse 86, temperature 98.2 F (36.8 C), temperature source Oral, resp. rate 18, height 5\' 7"  (1.702 m), weight 77.111 kg (170 lb), SpO2 97 %.  PHYSICAL EXAMINATION:   Physical Exam  GENERAL:  55 y.o.-year-old patient lying in the bed with no acute distress. NG tube in place. EYES: Pupils equal, round, reactive to light and accommodation. No scleral icterus. Extraocular muscles intact.  HEENT: Head atraumatic, normocephalic. Oropharynx and nasopharynx clear.  NECK:  Supple, no jugular venous distention. No thyroid enlargement, no tenderness.  LUNGS: Normal breath sounds bilaterally, no wheezing, rales, rhonchi. No use of accessory muscles of respiration.  CARDIOVASCULAR: S1,  S2 normal. No murmurs, rubs, or gallops.  ABDOMEN: Distended. Bowel sounds absent. Diffuse tenderness. EXTREMITIES: No cyanosis, clubbing or edema b/l.    NEUROLOGIC: Cranial nerves II through XII are intact. No focal Motor or sensory deficits b/l.   PSYCHIATRIC: The patient is alert and oriented x 3.  SKIN: No obvious rash, lesion, or ulcer.   LABORATORY PANEL:   CBC  Recent Labs Lab 09/28/15 0504  WBC 11.8*  HGB 15.4  HCT 45.7  PLT 193   ------------------------------------------------------------------------------------------------------------------ Chemistries   Recent Labs Lab 09/27/15 0654  NA 136  K 4.2  CL 97*  CO2 27  GLUCOSE 341*  BUN 16  CREATININE 0.80  CALCIUM 9.6  AST 38  ALT 58  ALKPHOS 71  BILITOT 1.4*   ------------------------------------------------------------------------------------------------------------------  Cardiac Enzymes No results for input(s): TROPONINI in the last 168 hours. ------------------------------------------------------------------------------------------------------------------  RADIOLOGY:  Ct Abdomen Pelvis W Contrast  09/27/2015  CLINICAL DATA:  Vomiting last night. Abdominal bloating, history of bowel obstruction, status post appendectomy, status post cholecystectomy EXAM: CT ABDOMEN AND PELVIS WITH CONTRAST TECHNIQUE: Multidetector CT imaging of the abdomen and pelvis was performed using the standard protocol following bolus administration of intravenous contrast. CONTRAST:  100mL ISOVUE-300 IOPAMIDOL (ISOVUE-300) INJECTION 61% COMPARISON:  07/15/2014 and 05/17/2007 FINDINGS: Lower chest:  The lung bases are unremarkable. Hepatobiliary: Mild fatty infiltration of the liver. Status postcholecystectomy. No focal hepatic mass. Pancreas: Enhanced pancreas is unremarkable. Spleen: Enhanced spleen is unremarkable. Adrenals/Urinary Tract: No adrenal gland mass. Enhanced kidneys are symmetrical in size. Again noted cyst in upper pole  of the left kidney measures 3.3 cm. No hydronephrosis or hydroureter. Delayed renal images shows bilateral renal symmetrical excretion. Bilateral  visualized proximal ureter is unremarkable. The urinary bladder is under distended grossly unremarkable. Stomach/Bowel: Some oral contrast material noted within proximal small bowel and stomach. There are distended mid abdominal small bowel loops with fluid and air-fluid levels. Postsurgical changes are noted in distal small bowel in right lower quadrant. There is mild dilatation of small bowel at the anastomosis site with some fecal like material please see axial image 53. A small fecalith is noted within small bowel lumen axial image 49 measures 8 mm. Beyond this point the small bowel is smaller caliber. The terminal ileum is decompressed small in caliber. Findings are highly suspicious for at least partial small bowel obstruction. Some liquid stool noted in right colon. Some colonic stool noted in descending colon and sigmoid colon. No distal colonic obstruction. No colitis or diverticulitis. Vascular/Lymphatic: No aortic aneurysm. Small nonspecific mesenteric lymph nodes are noted. No retroperitoneal adenopathy. Reproductive: No pelvic mass. Prostate gland and seminal vesicles are unremarkable. Other: No free abdominal air. Tiny amount of free fluid noted in posterior pelvis. Musculoskeletal: No destructive bony lesions are noted. Sagittal images of the spine shows degenerative changes thoracolumbar spine. Significant disc space flattening with vacuum disc phenomenon mild anterior and mild posterior spurring at L5-S1 level. IMPRESSION: 1. Postsurgical changes are noted in distal small bowel in right lower quadrant. There is mild dilatation of small bowel at the anastomosis site with some fecal like material please see axial image 53. A small fecalith is noted within small bowel lumen axial image 49 measures 8 mm. Beyond this point the small bowel is smaller caliber.  The terminal ileum is decompressed small in caliber. Findings are highly suspicious for at least partial small bowel obstruction. 2. No hydronephrosis or hydroureter. 3. No evidence of colitis or diverticulitis. 4. Degenerative changes thoracolumbar spine. 5. Fatty infiltration of the liver. 6. Status post cholecystectomy. 7. Stable cyst in upper pole of the left kidney measures 3.3 cm. Electronically Signed   By: Natasha Mead M.D.   On: 09/27/2015 08:43   Dg Abd 2 Views  09/28/2015  CLINICAL DATA:  Small bowel obstruction. EXAM: ABDOMEN - 2 VIEW COMPARISON:  CT abdomen/ pelvis from 09/27/2015. FINDINGS: Enteric tube terminates in the gastric fundus. Cholecystectomy clips are seen in the right upper quadrant of the abdomen. Mildly dilated small bowel loops in the left abdomen up to the 3.8 cm diameter, not definitely changed. Surgical sutures in the right abdomen. Mild gas in stool in the colon. No evidence of pneumatosis or pneumoperitoneum. No pathologic soft tissue calcification. IMPRESSION: Stable mildly dilated small bowel loops in the left abdomen, suggesting a persistent partial mid to distal small bowel obstruction. Electronically Signed   By: Delbert Phenix M.D.   On: 09/28/2015 10:44     ASSESSMENT AND PLAN:   * Diabetes mellitus type 2, new diagnosis Patient doesn't be nothing by mouth. We'll continue sliding scale insulin. HbA1c at 8.7. Can be started on oral hypoglycemics once able to take pills. He is nothing by mouth now.  * Hypertension Use IV when necessary medications. Restart home medications when able.  * Small bowel obstruction as per surgical team. Patient is nothing by mouth. Has NG tube in place.  All the records are reviewed and case discussed with Care Management/Social Workerr. Management plans discussed with the patient, family and they are in agreement.  DVT Prophylaxis: SCDs  TOTAL TIME TAKING CARE OF THIS PATIENT: 30 minutes.   POSSIBLE D/C IN 2-3 DAYS, DEPENDING  ON CLINICAL CONDITION.  Milagros Loll R M.D on 09/28/2015 at 11:38 AM  Between 7am to 6pm - Pager - [phone]  After 6pm go to [email or web address] - password [email or web address] [facility]  [facility]  Office  [phone]  CC: Primary care physician; No PCP Per Patient  Note: This dictation was prepared with Dragon dictation along with smaller phrase technology. Any transcriptional errors that result from this process are unintentional.

## 2015-09-28 NOTE — Progress Notes (Signed)
Patient ID: Scott [name], male   DOB: [birth year], 55 y.o.   MRN: [id number]  Patient states that he was doing well over night but his throat is sore.  He states that he is passing gas, but has not had any bowel movements. NG tube still in place and draining well.   AVSS. WBC is down to 11  Abdomen is less distended than yesterday with active bowel sounds. Tympanic to percussion. Non-tender to palpation.    SBO is improved.  Obtain abdominal x-ray today, ducolax suppository to be given once.  Continue NG tube. Throat lozenges

## 2015-09-28 NOTE — Progress Notes (Signed)
Inpatient Diabetes Program Recommendations  AACE/ADA: New Consensus Statement on Inpatient Glycemic Control (2015)  Target Ranges:  Prepandial:   less than 140 mg/dL      Peak postprandial:   less than 180 mg/dL (1-2 hours)      Critically ill patients:  140 - 180 mg/dL   Patient has [employer] insurance but no MD- encouraged to call [employer] to enquire about available MD currently taking patients. Reviewed the Living Well with Diabetes book with patient.  He tells me that his mother, grandmother and both his brother and sister have diabetes.  We discussed the need to get a meter and to test sugars fasting and 2 hours after a meal.  I have requested MD give the patient a script for the meter when he is discharged.  We discussed meal planning using the plate method, the need to eat 3 meals and an hs snack and to avoid all beverages that have carbohydrates- I have put in a consult for the dietitian.   I discussed the typical medications that physician's order for new diagnosis, the names, the potential side effects and when to take them.  Patient very receptive and asking frequent questions.  I reviewed the symptoms and treatment of hypoglycemia- he was able to discuss as he had seen his mother use juice to treat low blood sugars.  Patient will need an order for outpatient diabetes education at the [facility] written by MD.  Susette RacerJulie Latrell Reitan, RN, BA, MHA, CDE Diabetes Coordinator Inpatient Diabetes Program  [phone] (Team Pager) [phone] [facility] Office) 09/28/2015 3:14 PM

## 2015-09-28 NOTE — Progress Notes (Signed)
Inpatient Diabetes Program Recommendations  AACE/ADA: New Consensus Statement on Inpatient Glycemic Control (2015)  Target Ranges:  Prepandial:   less than 140 mg/dL      Peak postprandial:   less than 180 mg/dL (1-2 hours)      Critically ill patients:  140 - 180 mg/dL   Lab Results  Component Value Date   GLUCAP 223* 09/28/2015   HGBA1C 8.7* 09/27/2015    Review of Glycemic Control  Results for Scott [name], Scott [name] (MRN [id number]) as of 09/28/2015 07:58  Ref. Range 09/27/2015 11:13 09/27/2015 18:15 09/27/2015 23:37 09/28/2015 05:35  Glucose-Capillary Latest Ref Range: 65-99 mg/dL 664209 (H) 403147 (H) 474176 (H) 223 (H)   Inpatient Diabetes Program Recommendations:  Consult to diabetes coordinator noted. A1C 8.7%- no previous record of diagnosis of diabetes.  Will order Living Well with diabetes and discuss with patient once MD has discussed elevated A1C with patient.  Consider starting the patient on Lantus 15 units qday beginning now (0.2units/kg)- even if he is NPO. Continue Novolog correction as ordered (0-9 units tid).  Consider adding Novolog correction 0-5 units qhs.   Susette RacerJulie Jeneal Vogl, RN, BA, MHA, CDE Diabetes Coordinator Inpatient Diabetes Program  [phone] (Team Pager) [phone] [facility] Office) 09/28/2015 7:58 AM

## 2015-09-28 NOTE — Progress Notes (Signed)
Initial Nutrition Assessment     INTERVENTION:  -Monitor diet progression and poc. -Will follow-up with DM diet education once pt on po diet and tolerating    NUTRITION DIAGNOSIS:   Food and nutrition related knowledge deficit related to chronic illness as evidenced by  (consult for diet education).    GOAL:   Patient will meet greater than or equal to 90% of their needs    MONITOR:   Diet advancement, Labs  REASON FOR ASSESSMENT:   Consult Diet education  ASSESSMENT:     Pt admitted with hyperglycemia and partial small bowel obstruction.  New diagnosis of diabetes.  NG tube in place to suction.    Past Medical History  Diagnosis Date  . Hypertension    Medications reviewed: aspart, LR at 14800ml/hr  Labs reviewed: FSBS 154, 223, 176, serum glucose 341  NG tube output: 300ml output. N  Diet Order:  Diet NPO time specified  Skin:  Reviewed, no issues  Last BM:  6/16  Height:   Ht Readings from Last 1 Encounters:  09/27/15 5\' 7"  (1.702 m)    Weight:   Wt Readings from Last 1 Encounters:  09/27/15 170 lb (77.111 kg)    Ideal Body Weight:     BMI:  Body mass index is 26.62 kg/(m^2).  Estimated Nutritional Needs:   Kcal:  1925-2310 kcals/d.   Protein:  92-116 g/d  Fluid:  1.9-2.3 L/d  EDUCATION NEEDS:   Education needs no appropriate at this time  Tyqwan Pink B. Freida BusmanAllen, RD, LDN [phone] (pager) Weekend/On-Call pager [phone])

## 2015-09-29 LAB — GLUCOSE, CAPILLARY
GLUCOSE-CAPILLARY: 156 mg/dL — AB (ref 65–99)
GLUCOSE-CAPILLARY: 120 mg/dL — AB (ref 65–99)
Glucose-Capillary: 125 mg/dL — ABNORMAL HIGH (ref 65–99)
Glucose-Capillary: 148 mg/dL — ABNORMAL HIGH (ref 65–99)
Glucose-Capillary: 153 mg/dL — ABNORMAL HIGH (ref 65–99)

## 2015-09-29 MED ORDER — ACETAMINOPHEN 650 MG RE SUPP
650.0000 mg | RECTAL | Status: DC | PRN
  Administered 2015-09-29: 650 mg via RECTAL
  Filled 2015-09-29: qty 1

## 2015-09-29 NOTE — Progress Notes (Signed)
His chief complaint on admission was abdominal pain with vomiting. He was admitted emergently with protracted bilious vomiting and CT findings of small bowel obstruction. He was placed on nasogastric suction.  He has had hyperglycemia which is improved.  I reviewed his past surgical history of Meckel's diverticulectomy and also has had appendectomy and cholecystectomy.  Today he reports no abdominal pain. He does continue on nasogastric suction and there is bilious drainage in the canister. He reports no difficulty breathing. He had 2 bowel movements yesterday. He has passed some gas today. He has walked 2 times in the hallway today.  On examination his vital signs are stable. He is awake alert and oriented and resting comfortably in the hospital bed. Lung sounds are clear. Heart regular rhythm S1 and S2 without murmur. Abdomen is minimally distended and tympanitic and soft and nontender.  I reviewed his plain abdominal films done yesterday which in the straight gas and small and large bowel consistent with partial small bowel obstruction.  Clinical data: Latest blood sugar 125. CBC yesterday with white blood count 11,800, hemoglobin 13.4. Comprehensive panel on 6:15 was with a creatinine of 0.8  Impression his partial small bowel obstruction  Plan is to continue with nasogastric suction, and walking in the hallway. Anticipate at some point doing repeat abdominal x-ray. I discussed with him the possibility as to whether he may or may not need surgery.  Renda RollsWilton [name] M.D.

## 2015-09-29 NOTE — Progress Notes (Signed)
[facility] [place] at [facility]   PATIENT NAME: Scott [name]    MRN#:  [id number]  DATE OF BIRTH:  05-12-1960  SUBJECTIVE:  Hospital Day: 2 days [place] Ice is a 55 y.o. male presenting with Emesis and Abdominal Pain .   Overnight events: No overnight events Interval Events: No complaints at this time-bowel movement yesterday  REVIEW OF SYSTEMS:  CONSTITUTIONAL: No fever, fatigue or weakness.  EYES: No blurred or double vision.  EARS, NOSE, AND THROAT: No tinnitus or ear pain.  RESPIRATORY: No cough, shortness of breath, wheezing or hemoptysis.  CARDIOVASCULAR: No chest pain, orthopnea, edema.  GASTROINTESTINAL: No nausea, vomiting, diarrhea or abdominal pain.  GENITOURINARY: No dysuria, hematuria.  ENDOCRINE: No polyuria, nocturia,  HEMATOLOGY: No anemia, easy bruising or bleeding SKIN: No rash or lesion. MUSCULOSKELETAL: No joint pain or arthritis.   NEUROLOGIC: No tingling, numbness, weakness.  PSYCHIATRY: No anxiety or depression.   DRUG ALLERGIES:  No Known Allergies  VITALS:  Blood pressure 146/96, pulse 96, temperature 97.6 F (36.4 C), temperature source Oral, resp. rate 16, height  (1.702 m), weight 170 lb (77.111 kg), SpO2 97 %.  PHYSICAL EXAMINATION:  VITAL SIGNS: Filed Vitals:   09/28/15 2300 09/29/15 0552  BP: 134/77 146/96  Pulse: 91 96  Temp:  97.6 F (36.4 C)  Resp:  16   GENERAL:54 y.o.male currently in no acute distress.  HEAD: Normocephalic, atraumatic.  EYES: Pupils equal, round, reactive to light. Extraocular muscles intact. No scleral icterus.  MOUTH: Moist mucosal membrane. Dentition intact. No abscess noted.  EAR, NOSE, THROAT: Clear without exudates. No external lesions.  NECK: Supple. No thyromegaly. No nodules. No JVD.  PULMONARY: Clear to ascultation, without wheeze rails or rhonci. No use of accessory muscles, Good respiratory effort. good air entry bilaterally CHEST: Nontender to palpation.   CARDIOVASCULAR: S1 and S2. Regular rate and rhythm. No murmurs, rubs, or gallops. No edema. Pedal pulses 2+ bilaterally.  GASTROINTESTINAL:NG tube in place to suction, Soft, nontender, nondistended. No masses. Positive bowel sounds. No hepatosplenomegaly.  MUSCULOSKELETAL: No swelling, clubbing, or edema. Range of motion full in all extremities.  NEUROLOGIC: Cranial nerves II through XII are intact. No gross focal neurological deficits. Sensation intact. Reflexes intact.  SKIN: No ulceration, lesions, rashes, or cyanosis. Skin warm and dry. Turgor intact.  PSYCHIATRIC: Mood, affect within normal limits. The patient is awake, alert and oriented x 3. Insight, judgment intact.      LABORATORY PANEL:   CBC  Recent Labs Lab 09/28/15 0504  WBC 11.8*  HGB 15.4  HCT 45.7  PLT 193   ------------------------------------------------------------------------------------------------------------------  Chemistries   Recent Labs Lab 09/27/15 0654  NA 136  K 4.2  CL 97*  CO2 27  GLUCOSE 341*  BUN 16  CREATININE 0.80  CALCIUM 9.6  AST 38  ALT 58  ALKPHOS 71  BILITOT 1.4*   ------------------------------------------------------------------------------------------------------------------  Cardiac Enzymes No results for input(s): TROPONINI in the last 168 hours. ------------------------------------------------------------------------------------------------------------------  RADIOLOGY:  Dg Abd 2 Views  09/28/2015  CLINICAL DATA:  Small bowel obstruction. EXAM: ABDOMEN - 2 VIEW COMPARISON:  CT abdomen/ pelvis from 09/27/2015. FINDINGS: Enteric tube terminates in the gastric fundus. Cholecystectomy clips are seen in the right upper quadrant of the abdomen. Mildly dilated small bowel loops in the left abdomen up to the 3.8 cm diameter, not definitely changed. Surgical sutures in the right abdomen. Mild gas in stool in the colon. No evidence of pneumatosis or pneumoperitoneum. No  pathologic soft  tissue calcification. IMPRESSION: Stable mildly dilated small bowel loops in the left abdomen, suggesting a persistent partial mid to distal small bowel obstruction. Electronically Signed   By: Delbert PhenixJason A Poff M.D.   On: 09/28/2015 10:44    EKG:   Orders placed or performed during the hospital encounter of 09/27/15  . ED EKG  . ED EKG  . EKG 12-Lead  . EKG 12-Lead  . EKG 12-Lead  . EKG 12-Lead    ASSESSMENT AND PLAN:   Ronelle NighJames Victorino is a 55 y.o. male presenting with Emesis and Abdominal Pain . Admitted 09/27/2015 : Day #: 2 days   1. Diabetes mellitus type 2, new diagnosis Patient doesn't be nothing by mouth. We'll continue sliding scale insulin. HbA1c at 8.7. Metformin as outpatient  2 essential Hypertension Use IV when necessary medications. Restart home medications when able.  3. Small bowel obstruction as per surgical team. Patient is nothing by mouth. Has NG tube in place.   All the records are reviewed and case discussed with Care Management/Social Workerr. Management plans discussed with the patient, family and they are in agreement.  CODE STATUS: full TOTAL TIME TAKING CARE OF THIS PATIENT: 28 minutes.   POSSIBLE D/C IN 2-3DAYS, DEPENDING ON CLINICAL CONDITION.   Hower,  Mardi MainlandDavid K M.D on 09/29/2015 at 11:51 AM  Between 7am to 6pm - Pager - [phone]  After 6pm: House Pager: - [phone]  [facility]  Office  [phone]  CC: Primary care physician; No PCP Per Patient

## 2015-09-30 LAB — GLUCOSE, CAPILLARY
GLUCOSE-CAPILLARY: 149 mg/dL — AB (ref 65–99)
GLUCOSE-CAPILLARY: 140 mg/dL — AB (ref 65–99)
Glucose-Capillary: 163 mg/dL — ABNORMAL HIGH (ref 65–99)
Glucose-Capillary: 160 mg/dL — ABNORMAL HIGH (ref 65–99)

## 2015-09-30 MED ORDER — METOPROLOL TARTRATE 5 MG/5ML IV SOLN
5.0000 mg | Freq: Four times a day (QID) | INTRAVENOUS | Status: DC
  Administered 2015-09-30 – 2015-10-01 (×5): 5 mg via INTRAVENOUS
  Filled 2015-09-30 (×5): qty 5

## 2015-09-30 MED ORDER — KCL IN DEXTROSE-NACL 20-5-0.2 MEQ/L-%-% IV SOLN
INTRAVENOUS | Status: DC
  Administered 2015-09-30 – 2015-10-02 (×6): via INTRAVENOUS
  Filled 2015-09-30 (×9): qty 1000

## 2015-09-30 NOTE — Progress Notes (Signed)
He had a mild headache last night and was given a Tylenol suppository which helped. His chief complaint today is that his nasogastric tube fell out. The nurse called me earlier and we decided to leave it out. He reports no nausea or vomiting. The tube was draining bile prior to falling out. He reports he had a small bowel movement this morning and has been passing some flatus per rectum. He reports no abdominal pain. He has been walking in the hallway.  I again noted is history of Meckel's diverticulectomy, appendectomy, cholecystectomy  On examination blood pressure is 163/109, pulse 101, afebrile. He is awake alert and oriented and in no acute distress. Lung sounds are clear. Heart regular rhythm S1 and S2 without murmur. Abdomen is minimally distended soft and nontender with no palpable mass.  Latest glucose 149  Impression partial small bowel obstruction, hypertension  We will change IV from lactated Ringer's to D5 quarter normal saline with 20 mEq KCl per liter to reduce the amount of salt. He has been followed by internal medicine and there may be a role for antihypertensive medicine.  Will leave his nasogastric tube out as a trial. I advised him that if he does develop recurrent nausea and vomiting or abdominal pain he may need to have the nasogastric tube reinserted.  Also plan to do flat and upright x-ray of the abdomen tomorrow

## 2015-09-30 NOTE — Plan of Care (Signed)
Problem: Fluid Volume: Goal: Ability to maintain a balanced intake and output will improve Outcome: Not Progressing Patient is NPO due to NG tube and SBO.  Problem: Nutrition: Goal: Adequate nutrition will be maintained Outcome: Not Progressing Patient is NPO.

## 2015-09-30 NOTE — Progress Notes (Signed)
[facility] [place] [facility]   PATIENT NAME: Scott NighJames [name]    MRN#:  [id number]  DATE OF BIRTH:  February 17, 1961  SUBJECTIVE:  Hospital Day: 3 days Scott [name] is a 55 y.o. male presenting with Emesis and Abdominal Pain .   Overnight events: NGT fell out Interval Events: No complaints at this time  REVIEW OF SYSTEMS:  CONSTITUTIONAL: No fever, fatigue or weakness.  EYES: No blurred or double vision.  EARS, NOSE, AND THROAT: No tinnitus or ear pain.  RESPIRATORY: No cough, shortness of breath, wheezing or hemoptysis.  CARDIOVASCULAR: No chest pain, orthopnea, edema.  GASTROINTESTINAL: No nausea, vomiting, diarrhea or abdominal pain.  GENITOURINARY: No dysuria, hematuria.  ENDOCRINE: No polyuria, nocturia,  HEMATOLOGY: No anemia, easy bruising or bleeding SKIN: No rash or lesion. MUSCULOSKELETAL: No joint pain or arthritis.   NEUROLOGIC: No tingling, numbness, weakness.  PSYCHIATRY: No anxiety or depression.   DRUG ALLERGIES:  No Known Allergies  VITALS:  Blood pressure 163/109, pulse 101, temperature 97.7 F (36.5 C), temperature source Oral, resp. rate 18, height 5\' 7"  (1.702 m), weight 170 lb (77.111 kg), SpO2 94 %.  PHYSICAL EXAMINATION:  VITAL SIGNS: Filed Vitals:   09/30/15 0239 09/30/15 0626  BP: 158/101 163/109  Pulse:  101  Temp:  97.7 F (36.5 C)  Resp:  18   GENERAL:54 y.o.male currently in no acute distress.  HEAD: Normocephalic, atraumatic.  EYES: Pupils equal, round, reactive to light. Extraocular muscles intact. No scleral icterus.  MOUTH: Moist mucosal membrane. Dentition intact. No abscess noted.  EAR, NOSE, THROAT: Clear without exudates. No external lesions.  NECK: Supple. No thyromegaly. No nodules. No JVD.  PULMONARY: Clear to ascultation, without wheeze rails or rhonci. No use of accessory muscles, Good respiratory effort. good air entry bilaterally CHEST: Nontender to palpation.  CARDIOVASCULAR: S1 and S2. Regular  rate and rhythm. No murmurs, rubs, or gallops. No edema. Pedal pulses 2+ bilaterally.  GASTROINTESTINAL: Soft, nontender, nondistended. No masses. Positive bowel sounds. No hepatosplenomegaly.  MUSCULOSKELETAL: No swelling, clubbing, or edema. Range of motion full in all extremities.  NEUROLOGIC: Cranial nerves II through XII are intact. No gross focal neurological deficits. Sensation intact. Reflexes intact.  SKIN: No ulceration, lesions, rashes, or cyanosis. Skin warm and dry. Turgor intact.  PSYCHIATRIC: Mood, affect within normal limits. The patient is awake, alert and oriented x 3. Insight, judgment intact.      LABORATORY PANEL:   CBC  Recent Labs Lab 09/28/15 0504  WBC 11.8*  HGB 15.4  HCT 45.7  PLT 193   ------------------------------------------------------------------------------------------------------------------  Chemistries   Recent Labs Lab 09/27/15 0654  NA 136  K 4.2  CL 97*  CO2 27  GLUCOSE 341*  BUN 16  CREATININE 0.80  CALCIUM 9.6  AST 38  ALT 58  ALKPHOS 71  BILITOT 1.4*   ------------------------------------------------------------------------------------------------------------------  Cardiac Enzymes No results for input(s): TROPONINI in the last 168 hours. ------------------------------------------------------------------------------------------------------------------  RADIOLOGY:  No results found.  EKG:   Orders placed or performed during the hospital encounter of 09/27/15  . ED EKG  . ED EKG  . EKG 12-Lead  . EKG 12-Lead  . EKG 12-Lead  . EKG 12-Lead    ASSESSMENT AND PLAN:   Scott [name] is a 55 y.o. male presenting with Emesis and Abdominal Pain . Admitted 09/27/2015 : Day #: 3 days   1. Diabetes mellitus type 2, new diagnosis Patient doesn't be nothing by mouth. We'll continue sliding scale insulin. HbA1c at 8.7.  Metformin as outpatient  2 essential Hypertension Add Lopressor IV continue hydralazine when necessary  start Norvasc when tolerates oral diet   3. Small bowel obstruction as per surgical team.   All the records are reviewed and case discussed with Care Management/Social Workerr. Management plans discussed with the patient, family and they are in agreement.  CODE STATUS: full TOTAL TIME TAKING CARE OF THIS PATIENT: 28 minutes.   POSSIBLE D/C IN 2-3DAYS, DEPENDING ON CLINICAL CONDITION.   Hower,  Mardi Mainland.D on 09/30/2015 at 12:03 PM  Between 7am to 6pm - Pager - [phone]  After 6pm: House Pager: - [phone]  [facility]  Office  [phone]  CC: Primary care physician; No PCP Per Patient

## 2015-10-01 ENCOUNTER — Inpatient Hospital Stay: Payer: BLUE CROSS/BLUE SHIELD

## 2015-10-01 LAB — GLUCOSE, CAPILLARY
GLUCOSE-CAPILLARY: 139 mg/dL — AB (ref 65–99)
GLUCOSE-CAPILLARY: 189 mg/dL — AB (ref 65–99)
GLUCOSE-CAPILLARY: 153 mg/dL — AB (ref 65–99)
Glucose-Capillary: 116 mg/dL — ABNORMAL HIGH (ref 65–99)

## 2015-10-01 LAB — BASIC METABOLIC PANEL
Anion gap: 9 (ref 5–15)
BUN: 9 mg/dL (ref 6–20)
CHLORIDE: 101 mmol/L (ref 101–111)
CO2: 26 mmol/L (ref 22–32)
Calcium: 9.3 mg/dL (ref 8.9–10.3)
Creatinine, Ser: 0.67 mg/dL (ref 0.61–1.24)
GFR calc non Af Amer: >60 mL/min (ref >60)
Glucose, Bld: 156 mg/dL — ABNORMAL HIGH (ref 65–99)
POTASSIUM: 4.1 mmol/L (ref 3.5–5.1)
SODIUM: 136 mmol/L (ref 135–145)

## 2015-10-01 LAB — CBC
HCT: 47.6 % (ref 40.0–52.0)
HEMOGLOBIN: 16 g/dL (ref 13.0–18.0)
MCH: 30.5 pg (ref 26.0–34.0)
MCHC: 33.5 g/dL (ref 32.0–36.0)
MCV: 91.1 fL (ref 80.0–100.0)
Platelets: 237 10*3/uL (ref 150–440)
RBC: 5.23 MIL/uL (ref 4.40–5.90)
RDW: 12.8 % (ref 11.5–14.5)
WBC: 9.9 10*3/uL (ref 3.8–10.6)

## 2015-10-01 MED ORDER — HYDRALAZINE HCL 20 MG/ML IJ SOLN: 10.0000 mg | INTRAMUSCULAR | Status: DC | PRN

## 2015-10-01 MED ORDER — AMLODIPINE BESYLATE 5 MG PO TABS
5.0000 mg | ORAL_TABLET | Freq: Every day | ORAL | Status: DC
  Administered 2015-10-01 – 2015-10-03 (×3): 5 mg via ORAL
  Filled 2015-10-01 (×3): qty 1

## 2015-10-01 NOTE — Progress Notes (Signed)
[facility] [place] [facility]   PATIENT NAME: Scott [name]    MRN#:  [id number]  DATE OF BIRTH:  Oct 18, 1960  SUBJECTIVE:  Hospital Day: 4 days Scott [name] is a 55 y.o. male presenting with Emesis and Abdominal Pain .   Overnight events: NGT fell out Interval Events: No complaints at this time  REVIEW OF SYSTEMS:  CONSTITUTIONAL: No fever, fatigue or weakness.  EYES: No blurred or double vision.  EARS, NOSE, AND THROAT: No tinnitus or ear pain.  RESPIRATORY: No cough, shortness of breath, wheezing or hemoptysis.  CARDIOVASCULAR: No chest pain, orthopnea, edema.  GASTROINTESTINAL: No nausea, vomiting, diarrhea or abdominal pain.  GENITOURINARY: No dysuria, hematuria.  ENDOCRINE: No polyuria, nocturia,  HEMATOLOGY: No anemia, easy bruising or bleeding SKIN: No rash or lesion. MUSCULOSKELETAL: No joint pain or arthritis.   NEUROLOGIC: No tingling, numbness, weakness.  PSYCHIATRY: No anxiety or depression.   DRUG ALLERGIES:  No Known Allergies  VITALS:  Blood pressure 129/90, pulse 82, temperature 98.1 F (36.7 C), temperature source Oral, resp. rate 20, height 5\' 7"  (1.702 m), weight 170 lb (77.111 kg), SpO2 96 %.  PHYSICAL EXAMINATION:  VITAL SIGNS: Filed Vitals:   10/01/15 1242 10/01/15 1326  BP: 130/89 129/90  Pulse: 75 82  Temp:  98.1 F (36.7 C)  Resp:  20   GENERAL:54 y.o.male currently in no acute distress.  HEAD: Normocephalic, atraumatic.  EYES: Pupils equal, round, reactive to light. Extraocular muscles intact. No scleral icterus.  MOUTH: Moist mucosal membrane. Dentition intact. No abscess noted.  EAR, NOSE, THROAT: Clear without exudates. No external lesions.  NECK: Supple. No thyromegaly. No nodules. No JVD.  PULMONARY: Clear to ascultation, without wheeze rails or rhonci. No use of accessory muscles, Good respiratory effort. good air entry bilaterally CHEST: Nontender to palpation.  CARDIOVASCULAR: S1 and S2. Regular  rate and rhythm. No murmurs, rubs, or gallops. No edema. Pedal pulses 2+ bilaterally.  GASTROINTESTINAL: Soft, nontender, nondistended. No masses. Positive bowel sounds. No hepatosplenomegaly.  MUSCULOSKELETAL: No swelling, clubbing, or edema. Range of motion full in all extremities.  NEUROLOGIC: Cranial nerves II through XII are intact. No gross focal neurological deficits. Sensation intact. Reflexes intact.  SKIN: No ulceration, lesions, rashes, or cyanosis. Skin warm and dry. Turgor intact.  PSYCHIATRIC: Mood, affect within normal limits. The patient is awake, alert and oriented x 3. Insight, judgment intact.      LABORATORY PANEL:   CBC  Recent Labs Lab 09/28/15 0504  WBC 11.8*  HGB 15.4  HCT 45.7  PLT 193   ------------------------------------------------------------------------------------------------------------------  Chemistries   Recent Labs Lab 09/27/15 0654  NA 136  K 4.2  CL 97*  CO2 27  GLUCOSE 341*  BUN 16  CREATININE 0.80  CALCIUM 9.6  AST 38  ALT 58  ALKPHOS 71  BILITOT 1.4*   ------------------------------------------------------------------------------------------------------------------  Cardiac Enzymes No results for input(s): TROPONINI in the last 168 hours. ------------------------------------------------------------------------------------------------------------------  RADIOLOGY:  Dg Abd 2 Views  10/01/2015  CLINICAL DATA:  Partial small bowel obstruction.  Vomiting. EXAM: ABDOMEN - 2 VIEW COMPARISON:  09/28/2015 FINDINGS: Mildly dilated small bowel loops in the mid abdomen with scattered air-fluid levels again noted, compatible with persistent partial small bowel obstruction. Gas throughout nondistended large bowel. Prior cholecystectomy. No organomegaly or free air. Visualized lung bases clear. IMPRESSION: Persistent partial small bowel obstruction pattern, not significantly changed. Electronically Signed   By: Charlett NoseKevin  Dover M.D.   On:  10/01/2015 07:33   Dg Kayleen MemosUgi Ilene QuaW/small  Bowel  10/01/2015  CLINICAL DATA:  Recent admission for its small bowel obstruction EXAM: UPPER GI SERIES WITH SMALL BOWEL FOLLOW-THROUGH single contrast study FLUOROSCOPY TIME:  Fluoroscopy Time (in minutes and seconds): 2 minutes, 0 seconds Number of Acquired Images:  14 fluoro spot and 7 overhead images TECHNIQUE: Combined double contrast and single contrast upper GI series using effervescent crystals, thick barium, and thin barium. Subsequently, serial images of the small bowel were obtained including spot views of the terminal ileum. COMPARISON:  KUB of today's date and abdominal and pelvic CT scan of September 27, 2015. FINDINGS: The patient ingested the thin barium without difficulty. The thoracic esophagus distended well. There was no thoracic esophageal stricture nor significant hiatal hernia. No reflux was observed. The barium tablet passed without difficulty. The stomach distended well. The mucosal fold pattern was normal. Gastric emptying was prompt. The duodenal bulb and Celsius sweep were normal in appearance. Over the course of approximately 2 hours the barium passed from the stomach to the right colon. There were loops of moderately distended small bowel noted in the mid abdomen and lower abdomen throughout the course of the study. On the final overhead image distended bowel was seen to transition to normal caliber small bowel in the right lower quadrant which was a area previous anastomosis and abnormality on the CT scan. The terminal ileum appeared normal. The patient voice no significant abdominal discomfort during the procedure. IMPRESSION: Findings compatible with a mild partial distal small bowel obstruction with findings similar to those seen on the CT scan. Normal appearance of the stomach and duodenum. Electronically Signed   By: Rhea Thrun  [place] M.D.   On: 10/01/2015 11:20    EKG:   Orders placed or performed during the hospital encounter of 09/27/15  . ED  EKG  . ED EKG  . EKG 12-Lead  . EKG 12-Lead  . EKG 12-Lead  . EKG 12-Lead    ASSESSMENT AND PLAN:   Gaston Dase is a 55 y.o. male presenting with Emesis and Abdominal Pain . Admitted 09/27/2015 : Day #: 4 days   1. Diabetes mellitus type 2, new diagnosis We'll continue sliding scale insulin. HbA1c at 8.7. Metformin as outpatient  2 essential Hypertension-better controlled  Add Lopressor IV continue hydralazine when necessary start Norvasc when tolerates oral diet   3. Small bowel obstruction as per surgical team.   All the records are reviewed and case discussed with Care Management/Social Workerr. Management plans discussed with the patient, family and they are in agreement.  CODE STATUS: full TOTAL TIME TAKING CARE OF THIS PATIENT: 28 minutes.   POSSIBLE D/C IN 2-3DAYS, DEPENDING ON CLINICAL CONDITION.   Katonya Blecher,  Mardi Mainland.D on 10/01/2015 at 1:57 PM  Between 7am to 6pm - Pager - [phone]  After 6pm: House Pager: - [phone]  [facility]  Office  [phone]  CC: Primary care physician; No PCP Per Patient

## 2015-10-01 NOTE — Progress Notes (Signed)
Patient ID: Scott [name], male   DOB: [birth year], 55 y.o.   MRN: [id number] Since ngt came out yesterday he reports he is feeling fine. Had 3-4 loose stools yesterday. No n/v. Abd is still mildly distended, hypoactive bowel sounds. Abd xray- still showing findings suggestive of partial small bowel obstruction. Plan for UGI with SBFT today.

## 2015-10-01 NOTE — Progress Notes (Signed)
Patient ID: Scott [name], male   DOB: 12/24/1960, 55 y.o.   MRN: [id number] UGI SBFT- suggests very mild obstruction in rlq area. No delay in transit time for barium to reach colon. Clinically pt is feeling like  his baseline.  Clear liquids started which he seems to tolerate.  Plan for advancing diet tomorrow, reassess as opt in 2-3 weeks. In the interim will have IM address his high BP

## 2015-10-02 LAB — CREATININE, SERUM: CREATININE: 0.71 mg/dL (ref 0.61–1.24)

## 2015-10-02 LAB — GLUCOSE, CAPILLARY
GLUCOSE-CAPILLARY: 160 mg/dL — AB (ref 65–99)
GLUCOSE-CAPILLARY: 161 mg/dL — AB (ref 65–99)
Glucose-Capillary: 140 mg/dL — ABNORMAL HIGH (ref 65–99)
Glucose-Capillary: 153 mg/dL — ABNORMAL HIGH (ref 65–99)
Glucose-Capillary: 156 mg/dL — ABNORMAL HIGH (ref 65–99)

## 2015-10-02 MED ORDER — METFORMIN HCL 500 MG PO TABS: 500.0000 mg | ORAL_TABLET | Freq: Two times a day (BID) | ORAL | Status: DC

## 2015-10-02 MED ORDER — AMLODIPINE BESYLATE 5 MG PO TABS: 5.0000 mg | ORAL_TABLET | Freq: Every day | ORAL | Status: DC

## 2015-10-02 NOTE — Progress Notes (Signed)
[facility] -  at [facility]   PATIENT NAME: Scott NighJames [name]    MRN#:  [id number]  DATE OF BIRTH:  06/20/1960  SUBJECTIVE:  Hospital Day: 5 days Scott [name] is a 55 y.o. male presenting with Emesis and Abdominal Pain .   Overnight events: NGT still out - no overnight events Interval Events: No complaints at this time - tolerating liquids  REVIEW OF SYSTEMS:  CONSTITUTIONAL: No fever, fatigue or weakness.  EYES: No blurred or double vision.  EARS, NOSE, AND THROAT: No tinnitus or ear pain.  RESPIRATORY: No cough, shortness of breath, wheezing or hemoptysis.  CARDIOVASCULAR: No chest pain, orthopnea, edema.  GASTROINTESTINAL: No nausea, vomiting, diarrhea or abdominal pain.  GENITOURINARY: No dysuria, hematuria.  ENDOCRINE: No polyuria, nocturia,  HEMATOLOGY: No anemia, easy bruising or bleeding SKIN: No rash or lesion. MUSCULOSKELETAL: No joint pain or arthritis.   NEUROLOGIC: No tingling, numbness, weakness.  PSYCHIATRY: No anxiety or depression.   DRUG ALLERGIES:  No Known Allergies  VITALS:  Blood pressure 137/93, pulse 79, temperature 97.5 F (36.4 C), temperature source Oral, resp. rate 20, height 5\' 7"  (1.702 m), weight 170 lb (77.111 kg), SpO2 97 %.  PHYSICAL EXAMINATION:  VITAL SIGNS: Filed Vitals:   10/01/15 2117 10/02/15 0635  BP: 134/84 137/93  Pulse: 88 79  Temp: 97.6 F (36.4 C) 97.5 F (36.4 C)  Resp: 16 20   GENERAL:54 y.o.male currently in no acute distress.  HEAD: Normocephalic, atraumatic.  EYES: Pupils equal, round, reactive to light. Extraocular muscles intact. No scleral icterus.  MOUTH: Moist mucosal membrane. Dentition intact. No abscess noted.  EAR, NOSE, THROAT: Clear without exudates. No external lesions.  NECK: Supple. No thyromegaly. No nodules. No JVD.  PULMONARY: Clear to ascultation, without wheeze rails or rhonci. No use of accessory muscles, Good respiratory effort. good air entry bilaterally CHEST:  Nontender to palpation.  CARDIOVASCULAR: S1 and S2. Regular rate and rhythm. No murmurs, rubs, or gallops. No edema. Pedal pulses 2+ bilaterally.  GASTROINTESTINAL: Soft, nontender, nondistended. No masses. Positive bowel sounds. No hepatosplenomegaly.  MUSCULOSKELETAL: No swelling, clubbing, or edema. Range of motion full in all extremities.  NEUROLOGIC: Cranial nerves II through XII are intact. No gross focal neurological deficits. Sensation intact. Reflexes intact.  SKIN: No ulceration, lesions, rashes, or cyanosis. Skin warm and dry. Turgor intact.  PSYCHIATRIC: Mood, affect within normal limits. The patient is awake, alert and oriented x 3. Insight, judgment intact.      LABORATORY PANEL:   CBC  Recent Labs Lab 10/01/15 1832  WBC 9.9  HGB 16.0  HCT 47.6  PLT 237   ------------------------------------------------------------------------------------------------------------------  Chemistries   Recent Labs Lab 09/27/15 0654 10/01/15 1832 10/02/15 0655  NA 136 136  --   K 4.2 4.1  --   CL 97* 101  --   CO2 27 26  --   GLUCOSE 341* 156*  --   BUN 16 9  --   CREATININE 0.80 0.67 0.71  CALCIUM 9.6 9.3  --   AST 38  --   --   ALT 58  --   --   ALKPHOS 71  --   --   BILITOT 1.4*  --   --    ------------------------------------------------------------------------------------------------------------------  Cardiac Enzymes No results for input(s): TROPONINI in the last 168 hours. ------------------------------------------------------------------------------------------------------------------  RADIOLOGY:  Dg Abd 2 Views  10/01/2015  CLINICAL DATA:  Partial small bowel obstruction.  Vomiting. EXAM: ABDOMEN - 2 VIEW COMPARISON:  09/28/2015 FINDINGS:  Mildly dilated small bowel loops in the mid abdomen with scattered air-fluid levels again noted, compatible with persistent partial small bowel obstruction. Gas throughout nondistended large bowel. Prior cholecystectomy. No  organomegaly or free air. Visualized lung bases clear. IMPRESSION: Persistent partial small bowel obstruction pattern, not significantly changed. Electronically Signed   By: Charlett Nose M.D.   On: 10/01/2015 07:33   Dg Ugi W/small Bowel  10/01/2015  CLINICAL DATA:  Recent admission for its small bowel obstruction EXAM: UPPER GI SERIES WITH SMALL BOWEL FOLLOW-THROUGH single contrast study FLUOROSCOPY TIME:  Fluoroscopy Time (in minutes and seconds): 2 minutes, 0 seconds Number of Acquired Images:  14 fluoro spot and 7 overhead images TECHNIQUE: Combined double contrast and single contrast upper GI series using effervescent crystals, thick barium, and thin barium. Subsequently, serial images of the small bowel were obtained including spot views of the terminal ileum. COMPARISON:  KUB of today's date and abdominal and pelvic CT scan of September 27, 2015. FINDINGS: The patient ingested the thin barium without difficulty. The thoracic esophagus distended well. There was no thoracic esophageal stricture nor significant hiatal hernia. No reflux was observed. The barium tablet passed without difficulty. The stomach distended well. The mucosal fold pattern was normal. Gastric emptying was prompt. The duodenal bulb and Celsius sweep were normal in appearance. Over the course of approximately 2 hours the barium passed from the stomach to the right colon. There were loops of moderately distended small bowel noted in the mid abdomen and lower abdomen throughout the course of the study. On the final overhead image distended bowel was seen to transition to normal caliber small bowel in the right lower quadrant which was a area previous anastomosis and abnormality on the CT scan. The terminal ileum appeared normal. The patient voice no significant abdominal discomfort during the procedure. IMPRESSION: Findings compatible with a mild partial distal small bowel obstruction with findings similar to those seen on the CT scan. Normal  appearance of the stomach and duodenum. Electronically Signed   By: David  [place] M.D.   On: 10/01/2015 11:20    EKG:   Orders placed or performed during the hospital encounter of 09/27/15  . ED EKG  . ED EKG  . EKG 12-Lead  . EKG 12-Lead  . EKG 12-Lead  . EKG 12-Lead    ASSESSMENT AND PLAN:   Scott [name] is a 55 y.o. male presenting with Emesis and Abdominal Pain . Admitted 09/27/2015 : Day #: 5 days   1. Diabetes mellitus type 2, new diagnosis We'll continue sliding scale insulin. HbA1c at 8.7. Metformin as outpatient - script printed in chart -- increase as tolerated as outpatient, start lower dose given side effect GI upset  2 essential Hypertension-norvasc - script in chart  3. Small bowel obstruction as per surgical team.   All the records are reviewed and case discussed with Care Management/Social Workerr. Management plans discussed with the patient, family and they are in agreement.  CODE STATUS: full TOTAL TIME TAKING CARE OF THIS PATIENT: 28 minutes.   POSSIBLE D/C IN 2-3DAYS, DEPENDING ON CLINICAL CONDITION.   Hower,  Mardi Mainland.D on 10/02/2015 at 8:21 AM  Between 7am to 6pm - Pager - [phone]  After 6pm: House Pager: - [phone]  [facility]  Office  [phone]  CC: Primary care physician; No PCP Per Patient

## 2015-10-02 NOTE — Progress Notes (Signed)
Patient ID: Scott [name], male   DOB: 01/08/1961, 55 y.o.   MRN: [id number] No complaints. Has tolerated clear liq po. AVSS. BP appears under control. He is on Norvasc and has appt to see PCP. Advance diet. If ok will discharge in am.

## 2015-10-03 LAB — GLUCOSE, CAPILLARY: GLUCOSE-CAPILLARY: 183 mg/dL — AB (ref 65–99)

## 2015-10-03 MED ORDER — METFORMIN HCL 500 MG PO TABS
500.0000 mg | ORAL_TABLET | Freq: Two times a day (BID) | ORAL | Status: DC
  Administered 2015-10-03: 500 mg via ORAL
  Filled 2015-10-03: qty 1

## 2015-10-03 NOTE — Progress Notes (Signed)
Spoke with Dr. Clint GuyHower and Diabetes Coordinator. No outpatient follow-up appointment will be schedule. Patient is to see his PCP within the month and speak with PCP about an appointment with the diabetes clinic. Patient is on low-dose metformin. Patient understands and agrees with plan.

## 2015-10-03 NOTE — Discharge Summary (Signed)
Physician Discharge Summary  Patient ID: Scott [name] MRN: [id number] DOB/AGE: [birth year] 55 y.o.  Admit date: 09/27/2015 Discharge date: 10/03/2015  Admission Diagnoses:  Discharge Diagnoses:  Active Problems:   SBO (small bowel obstruction) (HCC)   Discharged Condition: good  Hospital Course: 55 year old male presented with the complaint of abdominal pain and distention nausea and vomiting. Evaluation initially with exam and CT suggested a partial small bowel obstruction. A transition point is suspected in the right lower abdomen likely in the area where he had previous small bowel resection for a Meckel's. One year ago the patient presented with similar complaints and at that time he had evidence of significant mesenteric thickening suggestive of the enteritis and inflammation of the mesentery associated with. The patient was treated conservatively at that time and responded well and recovered fully. He has had no recurrence of symptoms until now. He does have high blood pressure and diabetes both  somewhat poorly controlled since he had not been taking his medicines as prescribed. The patient had an NG tube placed and kept on IV fluids. The patient appeared to respond well with the recurrence of bowel activity. After the NG tube was removed the patient was able to tolerate liquids and subsequently soft food without any recurrence of nausea or vomiting. Patient did the have an upper GI and small bowel follow-through which shows this likely a very mild narrowing in the right lower abdominal area. The barium however reached the colon within 2 hours and it did not appear that the obstruction was significant enough. During the hospital stay he was seen in consultation by internal medicine and blood pressure and diabetes adequately controlled. Plan is for the patient to be discharged and followed as an outpatient. At some point the consideration can be given for an elective evaluation of  his abdominal cavity irrigated with laparoscopy or with surgery to assess the area of suspected narrowing. All of this has been discussed in full with the patient.  Consults: internal medicine  Significant Diagnostic Studies: radiology: CT scan:  as described above  Treatments: IV hydration and NGT  Discharge Exam: Blood pressure 123/84, pulse 87, temperature 97.4 F (36.3 C), temperature source Oral, resp. rate 18, height 5\' 7"  (1.702 m), weight 170 lb (77.111 kg), SpO2 99 %. GI: soft, non-tender; bowel sounds normal; no masses,  no organomegaly  Disposition: 01-Home or Self Care  Discharge Instructions    Activity as tolerated - No restrictions    Complete by:  As directed   May return to work tomorrow     Call MD for:  persistant nausea and vomiting    Complete by:  As directed      Call MD for:  severe uncontrolled pain    Complete by:  As directed      Diet - low sodium heart healthy    Complete by:  As directed             Medication List    TAKE these medications        amLODipine 5 MG tablet  Commonly known as:  NORVASC  Take 1 tablet (5 mg total) by mouth daily.     metFORMIN 500 MG tablet  Commonly known as:  GLUCOPHAGE  Take 1 tablet (500 mg total) by mouth 2 (two) times daily with a meal.           Follow-up Information    Follow up with Kieth BrightlySANKAR,Fara Worthy G, MD. Schedule an appointment as soon as  possible for a visit in 2 weeks.   Specialties:  General Surgery, Radiology   Why:  follow up for bowel obstruction   Contact information:   [address] [place] [address] [phone]       Signed: Kieth Brightly 10/03/2015, 8:31 AM

## 2015-10-03 NOTE — Progress Notes (Signed)
To whom it may concern,  Mr. Scott NighJames [name] was admitted to [facility] on 09/27/15 and discharged on 10/03/15. Please excuse him from work during these dates.  Thank you,  Tayelor Osborne D. Nani GasserBabb, BSN, RN

## 2015-10-03 NOTE — Progress Notes (Signed)
Pt stable. IV removed. D/c instructions given and education provided. Prescriptions verified and given. Pt states he understands instructions. Pt dressed and escorted out by staff. Driven home by family.  

## 2015-10-03 NOTE — Progress Notes (Signed)
[facility] [place] [facility]   PATIENT NAME: Scott NighJames [name]    MRN#:  [id number]  DATE OF BIRTH:  12-02-1960  SUBJECTIVE:  Hospital Day: 6 days Scott [name] is a 55 y.o. male presenting with Emesis and Abdominal Pain .   Overnight events: NGT still out - no overnight events Interval Events: No complaints at this time - tolerating liquids  REVIEW OF SYSTEMS:  CONSTITUTIONAL: No fever, fatigue or weakness.  EYES: No blurred or double vision.  EARS, NOSE, AND THROAT: No tinnitus or ear pain.  RESPIRATORY: No cough, shortness of breath, wheezing or hemoptysis.  CARDIOVASCULAR: No chest pain, orthopnea, edema.  GASTROINTESTINAL: No nausea, vomiting, diarrhea or abdominal pain.  GENITOURINARY: No dysuria, hematuria.  ENDOCRINE: No polyuria, nocturia,  HEMATOLOGY: No anemia, easy bruising or bleeding SKIN: No rash or lesion. MUSCULOSKELETAL: No joint pain or arthritis.   NEUROLOGIC: No tingling, numbness, weakness.  PSYCHIATRY: No anxiety or depression.   DRUG ALLERGIES:  No Known Allergies  VITALS:  Blood pressure 142/93, pulse 84, temperature 97.7 F (36.5 C), temperature source Oral, resp. rate 18, height 5\' 7"  (1.702 m), weight 170 lb (77.111 kg), SpO2 100 %.  PHYSICAL EXAMINATION:  VITAL SIGNS: Filed Vitals:   10/03/15 0424 10/03/15 0839  BP: 123/84 142/93  Pulse: 87 84  Temp: 97.4 F (36.3 C) 97.7 F (36.5 C)  Resp: 18 18   GENERAL:54 y.o.male currently in no acute distress.  HEAD: Normocephalic, atraumatic.  EYES: Pupils equal, round, reactive to light. Extraocular muscles intact. No scleral icterus.  MOUTH: Moist mucosal membrane. Dentition intact. No abscess noted.  EAR, NOSE, THROAT: Clear without exudates. No external lesions.  NECK: Supple. No thyromegaly. No nodules. No JVD.  PULMONARY: Clear to ascultation, without wheeze rails or rhonci. No use of accessory muscles, Good respiratory effort. good air entry bilaterally CHEST:  Nontender to palpation.  CARDIOVASCULAR: S1 and S2. Regular rate and rhythm. No murmurs, rubs, or gallops. No edema. Pedal pulses 2+ bilaterally.  GASTROINTESTINAL: Soft, nontender, nondistended. No masses. Positive bowel sounds. No hepatosplenomegaly.  MUSCULOSKELETAL: No swelling, clubbing, or edema. Range of motion full in all extremities.  NEUROLOGIC: Cranial nerves II through XII are intact. No gross focal neurological deficits. Sensation intact. Reflexes intact.  SKIN: No ulceration, lesions, rashes, or cyanosis. Skin warm and dry. Turgor intact.  PSYCHIATRIC: Mood, affect within normal limits. The patient is awake, alert and oriented x 3. Insight, judgment intact.      LABORATORY PANEL:   CBC  Recent Labs Lab 10/01/15 1832  WBC 9.9  HGB 16.0  HCT 47.6  PLT 237   ------------------------------------------------------------------------------------------------------------------  Chemistries   Recent Labs Lab 09/27/15 0654 10/01/15 1832 10/02/15 0655  NA 136 136  --   K 4.2 4.1  --   CL 97* 101  --   CO2 27 26  --   GLUCOSE 341* 156*  --   BUN 16 9  --   CREATININE 0.80 0.67 0.71  CALCIUM 9.6 9.3  --   AST 38  --   --   ALT 58  --   --   ALKPHOS 71  --   --   BILITOT 1.4*  --   --    ------------------------------------------------------------------------------------------------------------------  Cardiac Enzymes No results for input(s): TROPONINI in the last 168 hours. ------------------------------------------------------------------------------------------------------------------  RADIOLOGY:  No results found.  EKG:   Orders placed or performed during the hospital encounter of 09/27/15  . ED EKG  . ED EKG  .  EKG 12-Lead  . EKG 12-Lead  . EKG 12-Lead  . EKG 12-Lead    ASSESSMENT AND PLAN:   Caroll Cunnington is a 55 y.o. male presenting with Emesis and Abdominal Pain . Admitted 09/27/2015 : Day #: 6 days   1. Diabetes mellitus type 2, new  diagnosis We'll continue sliding scale insulin. HbA1c at 8.7. Metformin as outpatient - script printed in chart -- increase as tolerated as outpatient, start lower dose given side effect GI upset  2 essential Hypertension-norvasc - script in chart  3. Small bowel obstruction as per surgical team.   All the records are reviewed and case discussed with Care Management/Social Workerr. Management plans discussed with the patient, family and they are in agreement.  CODE STATUS: full TOTAL TIME TAKING CARE OF THIS PATIENT: 28 minutes.   POSSIBLE D/C IN 2-3DAYS, DEPENDING ON CLINICAL CONDITION.   Hower,  Mardi Mainland.D on 10/03/2015 at 1:24 PM  Between 7am to 6pm - Pager - [phone]  After 6pm: House Pager: - [phone]  [facility]  Office  [phone]  CC: Primary care physician; No PCP Per Patient

## 2015-10-03 NOTE — Progress Notes (Signed)
  RD consulted for nutrition education regarding diabetes.   Lab Results  Component Value Date   HGBA1C 8.7* 09/27/2015    RD provided "Carbohydrate Counting for People with Diabetes" handout from the Academy of Nutrition and Dietetics. Discussed different food groups and their effects on blood sugar, emphasizing carbohydrate-containing foods. Provided list of carbohydrates and recommended serving sizes of common foods.  Discussed importance of controlled and consistent carbohydrate intake throughout the day. Provided examples of ways to balance meals/snacks and encouraged intake of high-fiber, whole grain complex carbohydrates. Teach back method used.  Expect good compliance.  Body mass index is 26.62 kg/(m^2).   Current diet order is soft, patient is consuming approximately 50-100% of meals at this time. Labs and medications reviewed. No further nutrition interventions warranted at this time. RD contact information provided. If additional nutrition issues arise, please re-consult RD.  Isaiyah Feldhaus B. Freida BusmanAllen, RD, LDN [phone] (pager) Weekend/On-Call pager [phone])

## 2015-10-22 ENCOUNTER — Ambulatory Visit (INDEPENDENT_AMBULATORY_CARE_PROVIDER_SITE_OTHER): Payer: BLUE CROSS/BLUE SHIELD | Admitting: General Surgery

## 2015-10-22 ENCOUNTER — Encounter: Payer: Self-pay | Admitting: General Surgery

## 2015-10-22 VITALS — Ht 67.0 in | Wt 155.0 lb

## 2015-10-22 DIAGNOSIS — K56609 Unspecified intestinal obstruction, unspecified as to partial versus complete obstruction: Secondary | ICD-10-CM

## 2015-10-22 DIAGNOSIS — K5669 Other intestinal obstruction: Secondary | ICD-10-CM

## 2015-10-22 DIAGNOSIS — Z1211 Encounter for screening for malignant neoplasm of colon: Secondary | ICD-10-CM

## 2015-10-22 MED ORDER — POLYETHYLENE GLYCOL 3350 17 GM/SCOOP PO POWD: ORAL | Status: DC

## 2015-10-22 NOTE — Patient Instructions (Addendum)
Colonoscopy A colonoscopy is an exam to look at the entire large intestine (colon). This exam can help find problems such as tumors, polyps, inflammation, and areas of bleeding. The exam takes about 1 hour.  LET YOUR HEALTH CARE PROVIDER KNOW ABOUT:   Any allergies you have.  All medicines you are taking, including vitamins, herbs, eye drops, creams, and over-the-counter medicines.  Previous problems you or members of your family have had with the use of anesthetics.  Any blood disorders you have.  Previous surgeries you have had.  Medical conditions you have. RISKS AND COMPLICATIONS  Generally, this is a safe procedure. However, as with any procedure, complications can occur. Possible complications include:  Bleeding.  Tearing or rupture of the colon wall.  Reaction to medicines given during the exam.  Infection (rare). BEFORE THE PROCEDURE   Ask your health care provider about changing or stopping your regular medicines.  You may be prescribed an oral bowel prep. This involves drinking a large amount of medicated liquid, starting the day before your procedure. The liquid will cause you to have multiple loose stools until your stool is almost clear or light green. This cleans out your colon in preparation for the procedure.  Do not eat or drink anything else once you have started the bowel prep, unless your health care provider tells you it is safe to do so.  Arrange for someone to drive you home after the procedure. PROCEDURE   You will be given medicine to help you relax (sedative).  You will lie on your side with your knees bent.  A long, flexible tube with a light and camera on the end (colonoscope) will be inserted through the rectum and into the colon. The camera sends video back to a computer screen as it moves through the colon. The colonoscope also releases carbon dioxide gas to inflate the colon. This helps your health care provider see the area better.  During  the exam, your health care provider may take a small tissue sample (biopsy) to be examined under a microscope if any abnormalities are found.  The exam is finished when the entire colon has been viewed. AFTER THE PROCEDURE   Do not drive for 24 hours after the exam.  You may have a small amount of blood in your stool.  You may pass moderate amounts of gas and have mild abdominal cramping or bloating. This is caused by the gas used to inflate your colon during the exam.  Ask when your test results will be ready and how you will get your results. Make sure you get your test results.   This information is not intended to replace advice given to you by your health care provider. Make sure you discuss any questions you have with your health care provider.   Document Released: 03/28/2000 Document Revised: 01/19/2013 Document Reviewed: 12/06/2012 Elsevier Interactive Patient Education 2016 [employer]  

## 2015-10-22 NOTE — Progress Notes (Signed)
Patient ID: Scott [name], male   DOB: 02-07-1961, 55 y.o.   MRN: [id number]  Chief Complaint  Patient presents with  . Follow-up    bowel     HPI Scott [name] is a 55 y.o. male. here today for his follow up from his ER visit on 10/03/15 and subsequent hospital stay for partial small bowel obstruction. Patient states he is doing well. Moves his bowel every two days. No recurrence of abdominal pain or vomiting. I have reviewed the history of present illness with the patient.   HPI  Past Medical History  Diagnosis Date  . Hypertension     Past Surgical History  Procedure Laterality Date  . Cholecystectomy    . Appendectomy      History reviewed. No pertinent family history.  Social History Social History  Substance Use Topics  . Smoking status: Never Smoker   . Smokeless tobacco: None  . Alcohol Use: 0.0 oz/week    0 Standard drinks or equivalent per week     Comment: occasionally    No Known Allergies  Current Outpatient Prescriptions  Medication Sig Dispense Refill  . amLODipine (NORVASC) 5 MG tablet Take 1 tablet (5 mg total) by mouth daily. 30 tablet 0  . metFORMIN (GLUCOPHAGE) 500 MG tablet Take 1 tablet (500 mg total) by mouth 2 (two) times daily with a meal. 60 tablet 0  . polyethylene glycol powder (GLYCOLAX/MIRALAX) powder 255 grams one bottle for colonoscopy prep 255 g 0   No current facility-administered medications for this visit.    Review of Systems Review of Systems  Constitutional: Negative.   Respiratory: Negative.   Cardiovascular: Negative.   Gastrointestinal: Negative.     Height  (1.702 m), weight 155 lb (70.308 kg).  Physical Exam Physical Exam  Constitutional: He is oriented to person, place, and time. He appears well-developed and well-nourished.  Eyes: Conjunctivae are normal. No scleral icterus.  Neck: Neck supple.  Cardiovascular: Normal rate, regular rhythm and normal heart sounds.   Pulmonary/Chest:  Effort normal and breath sounds normal.  Abdominal: Soft. Bowel sounds are normal. There is no tenderness. No hernia.  Lymphadenopathy:    He has no cervical adenopathy.  Neurological: He is alert and oriented to person, place, and time.  Skin: Skin is warm and dry.    Data Reviewed Notes reviewed  Assessment    Pt has had 2 episodes of partial small bowel obstruction, suspected transition point in rlq area. Both times he recovered quickly with NGT and IVF.  He was offered an elective surgery to correct this but he preferred to wait to see if he has another episode. Noted he has never had a screening colonoscopy. He is agreeable to having this done.    Plan    Colonoscopy with possible biopsy/polypectomy prn: Information regarding the procedure, including its potential risks and complications (including but not limited to perforation of the bowel, which may require emergency surgery to repair, and bleeding) was verbally given to the patient. Educational information regarding lower intestinal endoscopy was given to the patient. Written instructions for how to complete the bowel prep using Miralax were provided. The importance of drinking ample fluids to avoid dehydration as a result of the prep emphasized.     Patient has been scheduled for a colonoscopy on 11-06-15 at [facility]. This patient has been asked to hold metformin day of colonoscopy prep and procedure.   PCP:  No Pcp  New PCP [facility] This information  has been scribed by Ples SpecterJessica Qualls CMA.   SANKAR,SEEPLAPUTHUR G 10/23/2015, 5:16 PM

## 2015-10-23 ENCOUNTER — Encounter: Payer: Self-pay | Admitting: General Surgery

## 2015-10-29 ENCOUNTER — Encounter: Payer: Self-pay | Admitting: Family Medicine

## 2015-10-29 ENCOUNTER — Ambulatory Visit (INDEPENDENT_AMBULATORY_CARE_PROVIDER_SITE_OTHER): Payer: BLUE CROSS/BLUE SHIELD | Admitting: Family Medicine

## 2015-10-29 ENCOUNTER — Other Ambulatory Visit: Payer: Self-pay | Admitting: Family Medicine

## 2015-10-29 VITALS — BP 129/81 | HR 97 | Temp 98.2°F | Resp 16 | Ht 67.0 in | Wt 156.0 lb

## 2015-10-29 DIAGNOSIS — Z125 Encounter for screening for malignant neoplasm of prostate: Secondary | ICD-10-CM

## 2015-10-29 DIAGNOSIS — E119 Type 2 diabetes mellitus without complications: Secondary | ICD-10-CM

## 2015-10-29 DIAGNOSIS — B351 Tinea unguium: Secondary | ICD-10-CM

## 2015-10-29 DIAGNOSIS — K5669 Other intestinal obstruction: Secondary | ICD-10-CM | POA: Diagnosis not present

## 2015-10-29 DIAGNOSIS — K56609 Unspecified intestinal obstruction, unspecified as to partial versus complete obstruction: Secondary | ICD-10-CM

## 2015-10-29 DIAGNOSIS — I10 Essential (primary) hypertension: Secondary | ICD-10-CM

## 2015-10-29 DIAGNOSIS — R109 Unspecified abdominal pain: Secondary | ICD-10-CM | POA: Insufficient documentation

## 2015-10-29 LAB — POCT UA - MICROALBUMIN: MICROALBUMIN (UR) POC: 0 mg/L

## 2015-10-29 MED ORDER — GLUCOSE BLOOD VI STRP: ORAL_STRIP | Status: DC

## 2015-10-29 MED ORDER — METFORMIN HCL 1000 MG PO TABS: 1000.0000 mg | ORAL_TABLET | Freq: Two times a day (BID) | ORAL | Status: DC

## 2015-10-29 NOTE — Assessment & Plan Note (Signed)
Mild. Toes. Pt will try home treatment. Consider referral to podiatry if not improved at 2 mos follow-up.

## 2015-10-29 NOTE — Assessment & Plan Note (Addendum)
Maximize metformin for max A1c benefit. Reviewed diet and lifestyle changes. Foot exam done. Reviewed foot care. Check lipid and discuss statin therapy with patient.  UA micro- done. Foot exam done. Eye exam ordered.  Recheck 2 mos.

## 2015-10-29 NOTE — Assessment & Plan Note (Signed)
Controlled with amlodipine. Will consider adding ACE if UA micro is positive. Recheck 2 mos.  Check BMP.

## 2015-10-29 NOTE — Patient Instructions (Signed)
Diabetes: I am going to increase metformin. Take 2 pills in the morning and 1 pill in the evening for about 1 week. Then increase to 2 pills morning and 2 pills evening until your bottle is empty. Pick up the new 1000mg  pill and 1 pill AM and 1 pill PM.   Your goal blood pressure is 140/90 Work on low salt/sodium diet - goal <1.5gm (1,500mg ) per day. Eat a diet high in fruits/vegetables and whole grains.  Look into mediterranean and DASH diet. Goal activity is 17050min/wk of moderate intensity exercise.  This can be split into 30 minute chunks.  If you are not at this level, you can start with smaller 10-15 min increments and slowly build up activity. Look at www.heart.org for more resources

## 2015-10-29 NOTE — Progress Notes (Signed)
Subjective:    Patient ID: Scott [name], male    DOB: 11/28/1960, 55 y.o.   MRN: [id number]  HPI: Scott [name] is a 55 y.o. male presenting on 10/29/2015 for Establish Care   HPI  Pt presents to establish care today. [facility] .  It has been years years since His last PCP visit. Records from previous provider will be requested and reviewed. Current medical problems include:  New onset diabetes: Found on 6/15 in the hospital. Was started on Metformin. Taking 500mg  twice daily. A1c was found to be 8.7% with fasting glucose of 160. No numbness or tingling in feet. No hypoglycemia or blurred vision. No diarrhea from metformin. No recent eye exam. Has a meter at home to check sugars. Checks blood sugars twice daily.  Recent bowel obstruction- was in the hospital. Decompressed by NGT.  Saw Dr. Evette CristalSankar last week. Having a bowel movment every other day- this is normal for him. No abd pain or nausea.    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  Non-smoker. Drinks no alcohol.  Scheduled for Colonoscopy next week- Dr. Evette CristalSankar    Past Medical History  Diagnosis Date  . Hypertension   . Diabetes mellitus without complication [facility](HCC)    Social History   Social History  . Marital Status: Married    Spouse Name: N/A  . Number of Children: N/A  . Years of Education: N/A   Occupational History  . Not on file.   Social History Main Topics  . Smoking status: Never Smoker   . Smokeless tobacco: Not on file  . Alcohol Use: 0.0 oz/week    0 Standard drinks or equivalent per week     Comment: occasionally  . Drug Use: No  . Sexual Activity: Not on file   Other Topics Concern  . Not on file   Social History Narrative   Family History  Problem Relation Age of Onset  . Heart disease Mother   . Hypertension Mother   . Diabetes Sister   . Diabetes Brother    Current Outpatient  Prescriptions on File Prior to Visit  Medication Sig  . amLODipine (NORVASC) 5 MG tablet Take 1 tablet (5 mg total) by mouth daily.   No current facility-administered medications on file prior to visit.    Review of Systems  Constitutional: Negative for fever and chills.  HENT: Negative.   Respiratory: Negative for chest tightness, shortness of breath and wheezing.   Cardiovascular: Negative for chest pain, palpitations and leg swelling.  Gastrointestinal: Negative for nausea, vomiting and abdominal pain.  Endocrine: Negative.   Genitourinary: Negative for dysuria, urgency, discharge, penile pain and testicular pain.  Musculoskeletal: Negative for back pain, joint swelling and arthralgias.  Skin: Negative.   Neurological: Negative for dizziness, weakness, numbness and headaches.  Psychiatric/Behavioral: Negative for sleep disturbance and dysphoric mood.   Per HPI unless specifically indicated above     Objective:    BP 129/81 mmHg  Pulse 97  Temp(Src) 98.2 F (36.8 C) (Oral)  Resp 16  Ht 5\' 7"  (1.702 m)  Wt 156 lb (70.761 kg)  BMI 24.43 kg/m2  Wt Readings from Last 3 Encounters:  10/29/15 156 lb (70.761 kg)  10/22/15 155 lb (70.308 kg)  09/27/15 170 lb (77.111 kg)    Depression screen PHQ 2/9 10/29/2015  Decreased Interest 0  Down, Depressed, Hopeless 0  PHQ -  2 Score 0     Physical Exam  Constitutional: He is oriented to person, place, and time. He appears well-developed and well-nourished. No distress.  HENT:  Head: Normocephalic and atraumatic.  Neck: Neck supple. No thyromegaly present.  Cardiovascular: Normal rate, regular rhythm and normal heart sounds.  Exam reveals no gallop and no friction rub.   No murmur heard. Pulmonary/Chest: Effort normal and breath sounds normal. He has no wheezes.  Abdominal: Soft. Bowel sounds are normal. He exhibits no distension. There is no tenderness. There is no rebound.  Musculoskeletal: Normal range of motion. He exhibits  no edema or tenderness.  Neurological: He is alert and oriented to person, place, and time. He has normal reflexes.  Skin: Skin is warm and dry. No rash noted. No erythema.  Psychiatric: He has a normal mood and affect. His behavior is normal. Thought content normal.   Diabetic Foot Exam - Simple   Simple Foot Form  Diabetic Foot exam was performed with the following findings:  Yes 10/29/2015  2:05 PM  Visual Inspection  See comments:  Yes  Sensation Testing  Intact to touch and monofilament testing bilaterally:  Yes  Pulse Check  Posterior Tibialis and Dorsalis pulse intact bilaterally:  Yes  Comments  Yellow long nails. Thick and rigid great toes.         Assessment & Plan:   Problem List Items Addressed This Visit      Cardiovascular and Mediastinum   BP (high blood pressure)    Controlled with amlodipine. Will consider adding ACE if UA micro is positive. Recheck 2 mos.  Check BMP.      Relevant Orders   BASIC METABOLIC PANEL WITH GFR     Digestive   SBO (small bowel obstruction) (HCC)    Appears resolved. Pt will keep follow-up with Dr. Evette Cristal. Reviewed alarm symptoms.         Endocrine   New onset type 2 diabetes mellitus (HCC) - Primary    Maximize metformin for max A1c benefit. Reviewed diet and lifestyle changes. Foot exam done. Reviewed foot care. Check lipid and discuss statin therapy with patient.  UA micro- done. Foot exam done. Eye exam ordered.  Recheck 2 mos.       Relevant Medications   metFORMIN (GLUCOPHAGE) 1000 MG tablet   Other Relevant Orders   Lipid Profile   Ambulatory referral to Ophthalmology   POCT UA - Microalbumin (Completed)     Musculoskeletal and Integument   Onychomycosis    Mild. Toes. Pt will try home treatment. Consider referral to podiatry if not improved at 2 mos follow-up.        Other Visit Diagnoses    Screening for prostate cancer        Relevant Orders    PSA       Meds ordered this encounter  Medications  .  metFORMIN (GLUCOPHAGE) 1000 MG tablet    Sig: Take 1 tablet (1,000 mg total) by mouth 2 (two) times daily with a meal.    Dispense:  60 tablet    Refill:  11    Order Specific Question:  Supervising Provider    Answer:  Janeann Forehand [phone]      Follow up plan: Return in about 2 months (around 12/30/2015) for Diabetes. Marland Kitchen

## 2015-10-29 NOTE — Assessment & Plan Note (Signed)
Appears resolved. Pt will keep follow-up with Dr. Evette CristalSankar. Reviewed alarm symptoms.

## 2015-10-30 ENCOUNTER — Other Ambulatory Visit: Payer: Self-pay | Admitting: General Surgery

## 2015-10-30 LAB — BASIC METABOLIC PANEL WITH GFR
BUN: 13 mg/dL (ref 7–25)
CHLORIDE: 102 mmol/L (ref 98–110)
CO2: 27 mmol/L (ref 20–31)
CREATININE: 0.96 mg/dL (ref 0.70–1.33)
Calcium: 9.8 mg/dL (ref 8.6–10.3)
GFR, Est African American: >89 mL/min (ref >=60)
GFR, Est Non African American: >89 mL/min (ref >=60)
GLUCOSE: 86 mg/dL (ref 65–99)
Potassium: 4.7 mmol/L (ref 3.5–5.3)
Sodium: 139 mmol/L (ref 135–146)

## 2015-10-30 LAB — LIPID PANEL
Cholesterol: 138 mg/dL (ref 125–200)
HDL: 33 mg/dL — ABNORMAL LOW (ref >=40)
LDL CALC: 59 mg/dL (ref <130)
TRIGLYCERIDES: 232 mg/dL — AB (ref <150)
Total CHOL/HDL Ratio: 4.2 Ratio (ref <=5.0)
VLDL: 46 mg/dL — AB (ref <30)

## 2015-10-30 LAB — PSA: PSA: 1.02 ng/mL (ref <=4.00)

## 2015-10-31 ENCOUNTER — Other Ambulatory Visit: Payer: Self-pay | Admitting: Family Medicine

## 2015-11-01 ENCOUNTER — Telehealth: Payer: Self-pay | Admitting: *Deleted

## 2015-11-01 NOTE — Telephone Encounter (Signed)
Mr. Scott [name] called our office this morning stating that he needed to cancel his colonoscopy scheduled with Dr. Evette CristalSankar for Tuesday July 25th. He states that he just can't to it right now because he is too busy but will continue to follow up with his primary care doctor and will plan to reschedule this procedure after the first of the year.

## 2015-11-05 ENCOUNTER — Encounter: Payer: Self-pay | Admitting: *Deleted

## 2015-11-06 ENCOUNTER — Ambulatory Visit
Admission: RE | Admit: 2015-11-06 | Payer: BLUE CROSS/BLUE SHIELD | Source: Ambulatory Visit | Admitting: General Surgery

## 2015-11-06 ENCOUNTER — Encounter: Admission: RE | Payer: Self-pay | Source: Ambulatory Visit

## 2015-11-06 SURGERY — COLONOSCOPY WITH PROPOFOL
Anesthesia: General

## 2015-11-29 ENCOUNTER — Telehealth: Payer: Self-pay | Admitting: Family Medicine

## 2015-11-29 NOTE — Telephone Encounter (Signed)
Patient states rash appeared after starting 1000mg . Patient will follow recommendation and let us know status.

## 2015-11-29 NOTE — Telephone Encounter (Signed)
Please call this patient. Metformin was increased to 1000mg  twice daily on July 17. Has the rash been present since that time?  If so, Try reducing to 500mg  metformin twice daily to see if rash resolves. Also try OTC benadryl or Claritin to help with rash.   If rash does not resolve, please call the office to schedule an appt to have it evaluated.  Thanks! AK

## 2015-11-29 NOTE — Telephone Encounter (Signed)
Pt's has a rash on his side and thinks it started when his metformin was increased to 1000 mg twice daily.  His call back number is [phone]

## 2015-12-10 DIAGNOSIS — E119 Type 2 diabetes mellitus without complications: Secondary | ICD-10-CM | POA: Diagnosis not present

## 2015-12-10 LAB — HM DIABETES EYE EXAM

## 2015-12-11 ENCOUNTER — Encounter: Payer: Self-pay | Admitting: Family Medicine

## 2016-01-01 ENCOUNTER — Ambulatory Visit: Payer: BLUE CROSS/BLUE SHIELD | Admitting: Family Medicine

## 2016-01-18 ENCOUNTER — Ambulatory Visit (INDEPENDENT_AMBULATORY_CARE_PROVIDER_SITE_OTHER): Payer: BLUE CROSS/BLUE SHIELD | Admitting: Family Medicine

## 2016-01-18 ENCOUNTER — Encounter: Payer: Self-pay | Admitting: Family Medicine

## 2016-01-18 VITALS — BP 121/88 | HR 87 | Temp 97.6°F | Resp 16 | Ht 67.0 in | Wt 145.0 lb

## 2016-01-18 DIAGNOSIS — I10 Essential (primary) hypertension: Secondary | ICD-10-CM | POA: Diagnosis not present

## 2016-01-18 DIAGNOSIS — Z23 Encounter for immunization: Secondary | ICD-10-CM | POA: Diagnosis not present

## 2016-01-18 DIAGNOSIS — E119 Type 2 diabetes mellitus without complications: Secondary | ICD-10-CM

## 2016-01-18 LAB — POCT GLYCOSYLATED HEMOGLOBIN (HGB A1C): HEMOGLOBIN A1C: 5.4

## 2016-01-18 NOTE — Assessment & Plan Note (Signed)
Controlled well today in the office. Continue amlodipine once daily. Consider adding ACE at low dose at next visit if A1c becomes elevate. Current urine micro-albumin is negative.

## 2016-01-18 NOTE — Progress Notes (Signed)
Subjective:    Patient ID: Scott [name], male    DOB: 04/07/1961, 55 y.o.   MRN: [id number]  HPI: Scott [name] is a 55 y.o. male presenting on 01/18/2016 for Diabetes (highest 200 and lowest 80)   HPI  Pt presents for follow-up of diabetes. Is only taking metformin 500mg  twice daily. Last A1c was 8.7% today it is 5.4%. Overall doing well at home. Checking sugars- avg 90. Had an outlier of 200mg  in the AM- thinks it was food related. No numbness or tingling in his feet. No blurred vision. Had eye exam- No retinopathy- [facility] 12/10/15 Was due to have colonoscopy with Dr. Evette Cristal- but needed to reschedule for the first of the year.   Past Medical History:  Diagnosis Date  . Diabetes mellitus without complication (HCC)   . Hypertension     Current Outpatient Prescriptions on File Prior to Visit  Medication Sig  . amLODipine (NORVASC) 5 MG tablet TAKE 1 TABLET BY MOUTH EVERY DAY  . glucose blood test strip Use as instructed  . metFORMIN (GLUCOPHAGE) 1000 MG tablet Take 1 tablet (1,000 mg total) by mouth 2 (two) times daily with a meal.   No current facility-administered medications on file prior to visit.     Review of Systems  Constitutional: Negative for chills and fever.  HENT: Negative.   Respiratory: Negative for chest tightness, shortness of breath and wheezing.   Cardiovascular: Negative for chest pain, palpitations and leg swelling.  Gastrointestinal: Negative for abdominal pain, nausea and vomiting.  Endocrine: Negative.   Genitourinary: Negative for discharge, dysuria, penile pain, testicular pain and urgency.  Musculoskeletal: Negative for arthralgias, back pain and joint swelling.  Skin: Negative.   Neurological: Negative for dizziness, weakness, numbness and headaches.  Psychiatric/Behavioral: Negative for dysphoric mood and sleep disturbance.   Per HPI unless specifically indicated above     Objective:    BP 121/88   Pulse 87    Temp 97.6 F (36.4 C) (Oral)   Resp 16   Ht 5\' 7"  (1.702 m)   Wt 145 lb (65.8 kg)   BMI 22.71 kg/m   Wt Readings from Last 3 Encounters:  01/18/16 145 lb (65.8 kg)  10/29/15 156 lb (70.8 kg)  10/22/15 155 lb (70.3 kg)    Physical Exam  Constitutional: He is oriented to person, place, and time. He appears well-developed and well-nourished. No distress.  HENT:  Head: Normocephalic and atraumatic.  Eyes:  Strabismus L eye.   Neck: Neck supple. No thyromegaly present.  Cardiovascular: Normal rate, regular rhythm and normal heart sounds.  Exam reveals no gallop and no friction rub.   No murmur heard. Pulmonary/Chest: Effort normal and breath sounds normal. He has no wheezes.  Abdominal: Soft. Bowel sounds are normal. He exhibits no distension. There is no tenderness. There is no rebound.  Musculoskeletal: Normal range of motion. He exhibits no edema or tenderness.  Neurological: He is alert and oriented to person, place, and time. He has normal reflexes.  Skin: Skin is warm and dry. No rash noted. No erythema.  Psychiatric: He has a normal mood and affect. His behavior is normal. Thought content normal.   Results for orders placed or performed in visit on 01/18/16  POCT HgB A1C  Result Value Ref Range   Hemoglobin A1C 5.4       Assessment & Plan:   Problem List Items Addressed This Visit      Cardiovascular and Mediastinum  BP (high blood pressure)    Controlled well today in the office. Continue amlodipine once daily. Consider adding ACE at low dose at next visit if A1c becomes elevate. Current urine micro-albumin is negative.         Endocrine   Controlled diabetes mellitus type II without complication (HCC) - Primary    A1c down to 5.4%- will plan to reduce metformin to 500 mg once daily. Continue diet and lifestyle interventions. Encouraged 10 minutes of exercise with each meal. UA micro-albumin negative. Foot exam UTD. Eye exam 12/10/2015- no retinopathy. LDL <70-  however ASCVD risk is 8.8% pt would like to avoid statin at this time. Consider adding statin in the future.  Recheck 3 mos.       Relevant Orders   POCT HgB A1C (Completed)   Pneumococcal polysaccharide vaccine 23-valent greater than or equal to 2yo subcutaneous/IM (Completed)     Musculoskeletal and Integument   Onychomycosis    Other Visit Diagnoses    Need for vaccination for Strep pneumoniae       Relevant Orders   Pneumococcal polysaccharide vaccine 23-valent greater than or equal to 2yo subcutaneous/IM (Completed)      No orders of the defined types were placed in this encounter.     Follow up plan: Return in about 3 months (around 04/19/2016), or if symptoms worsen or fail to improve, for Diabetes. .Marland Kitchen

## 2016-01-18 NOTE — Patient Instructions (Addendum)
You are doing with your diabetes!  You should be very proud of yourself. Let's try reducing your Metformin to 500mg  once daily.   Your goal blood pressure is 140/90 Work on low salt/sodium diet - goal <1.5gm (1,500mg ) per day. Eat a diet high in fruits/vegetables and whole grains.  Look into mediterranean and DASH diet. Goal activity is 14250min/wk of moderate intensity exercise.  This can be split into 30 minute chunks.  If you are not at this level, you can start with smaller 10-15 min increments and slowly build up activity. Look at www.heart.org for more resources  Please seek immediate medical attention at ER or Urgent Care if you develop: Chest pain, pressure or tightness. Shortness of breath accompanied by nausea or diaphoresis Visual changes Numbness or tingling on one side of the body Facial droop Altered mental status Or any concerning symptoms.

## 2016-01-18 NOTE — Assessment & Plan Note (Addendum)
A1c down to 5.4%- will plan to reduce metformin to 500 mg once daily. Continue diet and lifestyle interventions. Encouraged 10 minutes of exercise with each meal. UA micro-albumin negative. Foot exam UTD. Eye exam 12/10/2015- no retinopathy. LDL <70- however ASCVD risk is 8.8% pt would like to avoid statin at this time. Consider adding statin in the future.  Recheck 3 mos.

## 2016-01-30 ENCOUNTER — Ambulatory Visit
Admission: RE | Admit: 2016-01-30 | Discharge: 2016-01-30 | Disposition: A | Discharge status: Home / Self Care | Payer: BLUE CROSS/BLUE SHIELD | Source: Ambulatory Visit | Attending: General Surgery | Admitting: General Surgery

## 2016-01-30 ENCOUNTER — Ambulatory Visit (INDEPENDENT_AMBULATORY_CARE_PROVIDER_SITE_OTHER): Payer: BLUE CROSS/BLUE SHIELD | Admitting: *Deleted

## 2016-01-30 ENCOUNTER — Telehealth: Payer: Self-pay | Admitting: General Surgery

## 2016-01-30 ENCOUNTER — Encounter: Payer: Self-pay | Admitting: *Deleted

## 2016-01-30 VITALS — BP 100/62 | HR 68 | Resp 14 | Ht 67.0 in | Wt 147.0 lb

## 2016-01-30 DIAGNOSIS — R933 Abnormal findings on diagnostic imaging of other parts of digestive tract: Secondary | ICD-10-CM | POA: Diagnosis not present

## 2016-01-30 DIAGNOSIS — R112 Nausea with vomiting, unspecified: Secondary | ICD-10-CM

## 2016-01-30 DIAGNOSIS — R109 Unspecified abdominal pain: Secondary | ICD-10-CM | POA: Diagnosis not present

## 2016-01-30 NOTE — Telephone Encounter (Signed)
Pt was called after 2 view abdomen was reviewed. He states he had bloating of abdomen with vomiting last night. At present he feels much better. Xra again shows mild small bowel distension suggesting recurrence of prior episodes. Advised pt to stay on a liquid diet today. He is to see me tomorrow at 8.45 am. In the interim if he has more vomiting and or abdominal bloating he is to call

## 2016-01-30 NOTE — Patient Instructions (Signed)
Patient to wait at [name] for call report by Dr Evette CristalSankar.

## 2016-01-30 NOTE — Progress Notes (Signed)
Patient ID: Scott [name], male   DOB: 1961-01-25, 55 y.o.   MRN: [id number]   Patient came in today to office complaining of vomiting for 2 days and "not feeling good". History of small bowel obstruction July. Order for xray per Dr Evette CristalSankar. Patient to wait at [facility] for call report by Dr Evette CristalSankar, pt agrees.

## 2016-01-31 ENCOUNTER — Ambulatory Visit (INDEPENDENT_AMBULATORY_CARE_PROVIDER_SITE_OTHER): Payer: BLUE CROSS/BLUE SHIELD | Admitting: General Surgery

## 2016-01-31 ENCOUNTER — Encounter: Payer: Self-pay | Admitting: General Surgery

## 2016-01-31 VITALS — BP 102/64 | HR 70 | Resp 12 | Ht 67.0 in | Wt 146.0 lb

## 2016-01-31 DIAGNOSIS — R112 Nausea with vomiting, unspecified: Secondary | ICD-10-CM

## 2016-01-31 DIAGNOSIS — K56609 Unspecified intestinal obstruction, unspecified as to partial versus complete obstruction: Secondary | ICD-10-CM | POA: Diagnosis not present

## 2016-01-31 NOTE — Progress Notes (Signed)
Patient ID: Scott [name], male   DOB: 01/22/1961, 55 y.o.   MRN: [id number]  Chief Complaint  Patient presents with  . Other    discuss x-ray    HPI Scott RackJames Garland Liberatore [name] is a 55 y.o. male here today to discuss x-ray that was done on10/18/17. He states he is weak feeling. Patient has not moved his bowels in three days. He had episode of abdominal bloating and vomiting 2 nights ago, similar to prior episodes of small bowel obstruction. He was placed on liquid diet yesterday and he has not had any vomiting since. I have reviewed the history of present illness with the patient.   HPI  Past Medical History:  Diagnosis Date  . Diabetes mellitus without complication (HCC)   . Hypertension     Past Surgical History:  Procedure Laterality Date  . APPENDECTOMY    . CHOLECYSTECTOMY      Family History  Problem Relation Age of Onset  . Heart disease Mother   . Hypertension Mother   . Diabetes Sister   . Diabetes Brother     Social History Social History  Substance Use Topics  . Smoking status: Never Smoker  . Smokeless tobacco: Not on file  . Alcohol use 0.0 oz/week     Comment: occasionally    No Known Allergies  Current Outpatient Prescriptions  Medication Sig Dispense Refill  . amLODipine (NORVASC) 5 MG tablet TAKE 1 TABLET BY MOUTH EVERY DAY 30 tablet 11  . glucose blood test strip Use as instructed 100 each 12  . metFORMIN (GLUCOPHAGE) 1000 MG tablet Take 1 tablet (1,000 mg total) by mouth 2 (two) times daily with a meal. 60 tablet 11   No current facility-administered medications for this visit.     Review of Systems Review of Systems  Constitutional: Negative.   Respiratory: Negative.   Cardiovascular: Negative.     Blood pressure 102/64, pulse 70, resp. rate 12, height 5\' 7"  (1.702 m), weight 146 lb (66.2 kg).  Physical Exam Physical Exam  Constitutional: He is oriented to person, place, and time. He appears well-developed and  well-nourished.  Eyes: Conjunctivae are normal. No scleral icterus.  Neck: Neck supple.  Cardiovascular: Normal rate, regular rhythm and normal heart sounds.   Pulmonary/Chest: Effort normal and breath sounds normal.  Abdominal: Soft. Normal appearance and bowel sounds are normal. He exhibits no distension and no mass. There is no hepatomegaly. There is no tenderness. No hernia.    Lymphadenopathy:    He has no cervical adenopathy.  Neurological: He is alert and oriented to person, place, and time.  Skin: Skin is warm and dry.    Data Reviewed X-ray reviewed   Assessment    Abdominal xray yesterday showed mild small bowel dilatation-not as prominent as his prior episodes. Based on prior CT and SBFT sudy it appears he has focal area of partial obstruction in RLQ area-likely from his prior small bowel resection at that site. He may benefit with laparoscopy and/or laparotomy to correct this. He was advised fully and he is agreeable.    Plan   Patient to eat light meals.  Patient to be scheduled for laparoscopy, possible laparotomy and bowel resection     This information has been scribed by Ples SpecterJessica Qualls CMA.   Nekhi Liwanag G 01/31/2016, 8:58 AM

## 2016-01-31 NOTE — Patient Instructions (Signed)
Follow up appointment to be announced.  

## 2016-02-01 LAB — BASIC METABOLIC PANEL
BUN/Creatinine Ratio: 16 (ref 9–20)
BUN: 15 mg/dL (ref 6–24)
CALCIUM: 9.6 mg/dL (ref 8.7–10.2)
CHLORIDE: 96 mmol/L (ref 96–106)
CO2: 30 mmol/L — AB (ref 18–29)
CREATININE: 0.96 mg/dL (ref 0.76–1.27)
GFR calc Af Amer: 104 mL/min/{1.73_m2} (ref >59)
GFR calc non Af Amer: 90 mL/min/{1.73_m2} (ref >59)
GLUCOSE: 121 mg/dL — AB (ref 65–99)
Potassium: 5 mmol/L (ref 3.5–5.2)
Sodium: 140 mmol/L (ref 134–144)

## 2016-02-01 LAB — CBC WITH DIFFERENTIAL/PLATELET
BASOS: 0 %
Basophils Absolute: 0 10*3/uL (ref 0.0–0.2)
EOS (ABSOLUTE): 0.2 10*3/uL (ref 0.0–0.4)
EOS: 2 %
HEMATOCRIT: 45.4 % (ref 37.5–51.0)
Hemoglobin: 15.7 g/dL (ref 12.6–17.7)
Immature Grans (Abs): 0 10*3/uL (ref 0.0–0.1)
Immature Granulocytes: 0 %
LYMPHS ABS: 1.9 10*3/uL (ref 0.7–3.1)
Lymphs: 19 %
MCH: 30.1 pg (ref 26.6–33.0)
MCHC: 34.6 g/dL (ref 31.5–35.7)
MCV: 87 fL (ref 79–97)
MONOS ABS: 0.9 10*3/uL (ref 0.1–0.9)
Monocytes: 8 %
NEUTROS ABS: 7.2 10*3/uL — AB (ref 1.4–7.0)
Neutrophils: 71 %
PLATELETS: 244 10*3/uL (ref 150–379)
RBC: 5.21 x10E6/uL (ref 4.14–5.80)
RDW: 13.3 % (ref 12.3–15.4)
WBC: 10.2 10*3/uL (ref 3.4–10.8)

## 2016-02-04 ENCOUNTER — Encounter
Admission: RE | Admit: 2016-02-04 | Discharge: 2016-02-04 | Disposition: A | Discharge status: Home / Self Care | Payer: BLUE CROSS/BLUE SHIELD | Source: Ambulatory Visit | Attending: General Surgery | Admitting: General Surgery

## 2016-02-04 HISTORY — DX: Personal history of urinary calculi: Z87.442

## 2016-02-04 NOTE — Patient Instructions (Signed)
  Your procedure is scheduled on: 02-05-16 Report to Same Day Surgery 2nd floor medical mall To find out your arrival time please call [phone] between 1PM - 3PM on 02-04-16  Remember: Instructions that are not followed completely may result in serious medical risk, up to and including death, or upon the discretion of your surgeon and anesthesiologist your surgery may need to be rescheduled.    _x___ 1. Do not eat food or drink liquids after midnight. No gum chewing or hard candies.     __x__ 2. No Alcohol for 24 hours before or after surgery.   __x__3. No Smoking for 24 prior to surgery.   ____  4. Bring all medications with you on the day of surgery if instructed.    __x__ 5. Notify your doctor if there is any change in your medical condition     (cold, fever, infections).     Do not wear jewelry, make-up, hairpins, clips or nail polish.  Do not wear lotions, powders, or perfumes. You may wear deodorant.  Do not shave 48 hours prior to surgery. Men may shave face and neck.  Do not bring valuables to the hospital.    [facility] is not responsible for any belongings or valuables.               Contacts, dentures or bridgework may not be worn into surgery.  Leave your suitcase in the car. After surgery it may be brought to your room.  For patients admitted to the hospital, discharge time is determined by your treatment team.   Patients discharged the day of surgery will not be allowed to drive home.    Please read over the following fact sheets that you were given:   [facility] Pre[facility] for [facility] and or MRSA Information   _x___ Take these medicines the morning of surgery with A SIP OF WATER:    1. AMLODIPINE  2.  3.  4.  5.  6.  ____Fleets enema or Magnesium Citrate as directed.   ____ Use CHG Soap or sage wipes as directed on instruction sheet   ____ Use inhalers on the day of surgery and bring to hospital day of surgery  ____ Stop metformin 2 days prior to  surgery    ____ Take 1/2 of usual insulin dose the night before surgery and none on the morning of           surgery.   ____ Stop aspirin or coumadin, or plavix  __ Stop Anti-inflammatories such as Advil, Aleve, Ibuprofen, Motrin, Naproxen,          Naprosyn, Goodies powders or aspirin products. Ok to take Tylenol.   ____ Stop supplements until after surgery.    ____ Bring C-Pap to the hospital.

## 2016-02-05 ENCOUNTER — Ambulatory Visit: Payer: BLUE CROSS/BLUE SHIELD | Admitting: Anesthesiology

## 2016-02-05 ENCOUNTER — Encounter: Payer: Self-pay | Admitting: *Deleted

## 2016-02-05 ENCOUNTER — Encounter
Admission: AD | Disposition: A | Discharge status: Home / Self Care | Payer: Self-pay | Source: Ambulatory Visit | Attending: General Surgery

## 2016-02-05 ENCOUNTER — Inpatient Hospital Stay
Admission: AD | Admit: 2016-02-05 | Discharge: 2016-02-10 | DRG: 331 | Disposition: A | Discharge status: Home / Self Care | Payer: BLUE CROSS/BLUE SHIELD | Source: Ambulatory Visit | Attending: General Surgery | Admitting: General Surgery

## 2016-02-05 DIAGNOSIS — I10 Essential (primary) hypertension: Secondary | ICD-10-CM | POA: Diagnosis present

## 2016-02-05 DIAGNOSIS — Z8249 Family history of ischemic heart disease and other diseases of the circulatory system: Secondary | ICD-10-CM | POA: Diagnosis not present

## 2016-02-05 DIAGNOSIS — K5669 Other partial intestinal obstruction: Principal | ICD-10-CM | POA: Diagnosis present

## 2016-02-05 DIAGNOSIS — Z87442 Personal history of urinary calculi: Secondary | ICD-10-CM | POA: Diagnosis not present

## 2016-02-05 DIAGNOSIS — Z7984 Long term (current) use of oral hypoglycemic drugs: Secondary | ICD-10-CM

## 2016-02-05 DIAGNOSIS — K5651 Intestinal adhesions [bands], with partial obstruction: Secondary | ICD-10-CM | POA: Diagnosis not present

## 2016-02-05 DIAGNOSIS — Z23 Encounter for immunization: Secondary | ICD-10-CM

## 2016-02-05 DIAGNOSIS — K56609 Unspecified intestinal obstruction, unspecified as to partial versus complete obstruction: Secondary | ICD-10-CM | POA: Diagnosis present

## 2016-02-05 DIAGNOSIS — K660 Peritoneal adhesions (postprocedural) (postinfection): Secondary | ICD-10-CM | POA: Diagnosis not present

## 2016-02-05 DIAGNOSIS — E119 Type 2 diabetes mellitus without complications: Secondary | ICD-10-CM

## 2016-02-05 DIAGNOSIS — Z833 Family history of diabetes mellitus: Secondary | ICD-10-CM | POA: Diagnosis not present

## 2016-02-05 DIAGNOSIS — R0602 Shortness of breath: Secondary | ICD-10-CM

## 2016-02-05 DIAGNOSIS — K565 Intestinal adhesions [bands], unspecified as to partial versus complete obstruction: Secondary | ICD-10-CM | POA: Diagnosis not present

## 2016-02-05 DIAGNOSIS — G8918 Other acute postprocedural pain: Secondary | ICD-10-CM | POA: Diagnosis not present

## 2016-02-05 DIAGNOSIS — R079 Chest pain, unspecified: Secondary | ICD-10-CM | POA: Diagnosis not present

## 2016-02-05 HISTORY — PX: LAPAROTOMY: SHX154

## 2016-02-05 HISTORY — PX: LYSIS OF ADHESION: SHX5961

## 2016-02-05 LAB — CREATININE, SERUM
CREATININE: 0.85 mg/dL (ref 0.61–1.24)
GFR calc Af Amer: >60 mL/min (ref >60)

## 2016-02-05 LAB — CBC
HCT: 41.1 % (ref 40.0–52.0)
Hemoglobin: 14.3 g/dL (ref 13.0–18.0)
MCH: 30.5 pg (ref 26.0–34.0)
MCHC: 34.8 g/dL (ref 32.0–36.0)
MCV: 87.7 fL (ref 80.0–100.0)
PLATELETS: 218 10*3/uL (ref 150–440)
RBC: 4.69 MIL/uL (ref 4.40–5.90)
RDW: 13.1 % (ref 11.5–14.5)
WBC: 16.6 10*3/uL — AB (ref 3.8–10.6)

## 2016-02-05 LAB — GLUCOSE, CAPILLARY
Glucose-Capillary: 83 mg/dL (ref 65–99)
Glucose-Capillary: 128 mg/dL — ABNORMAL HIGH (ref 65–99)

## 2016-02-05 LAB — SURGICAL PCR SCREEN
MRSA, PCR: NEGATIVE
Staphylococcus aureus: NEGATIVE

## 2016-02-05 SURGERY — LAPAROTOMY, EXPLORATORY
Anesthesia: General

## 2016-02-05 MED ORDER — OXYCODONE HCL 5 MG PO TABS
5.0000 mg | ORAL_TABLET | ORAL | Status: DC | PRN
  Administered 2016-02-05: 5 mg via ORAL
  Administered 2016-02-06 – 2016-02-08 (×2): 10 mg via ORAL
  Filled 2016-02-05: qty 2
  Filled 2016-02-05: qty 1
  Filled 2016-02-05 (×2): qty 2

## 2016-02-05 MED ORDER — ACETAMINOPHEN 10 MG/ML IV SOLN
INTRAVENOUS | Status: DC | PRN
  Administered 2016-02-05: 1000 mg via INTRAVENOUS

## 2016-02-05 MED ORDER — CHLORHEXIDINE GLUCONATE CLOTH 2 % EX PADS
6.0000 | MEDICATED_PAD | Freq: Once | CUTANEOUS | Status: DC
Start: 2016-02-05 — End: 2016-02-05

## 2016-02-05 MED ORDER — ACETAMINOPHEN 650 MG RE SUPP: 650.0000 mg | Freq: Four times a day (QID) | RECTAL | Status: DC | PRN

## 2016-02-05 MED ORDER — PROPOFOL 10 MG/ML IV BOLUS
INTRAVENOUS | Status: DC | PRN
  Administered 2016-02-05: 160 mg via INTRAVENOUS

## 2016-02-05 MED ORDER — PROMETHAZINE HCL 25 MG/ML IJ SOLN: 6.2500 mg | INTRAMUSCULAR | Status: DC | PRN

## 2016-02-05 MED ORDER — SODIUM CHLORIDE 0.9 % IV SOLN
INTRAVENOUS | Status: DC
  Administered 2016-02-05: 07:00:00 via INTRAVENOUS

## 2016-02-05 MED ORDER — ONDANSETRON 4 MG PO TBDP: 4.0000 mg | ORAL_TABLET | Freq: Four times a day (QID) | ORAL | Status: DC | PRN

## 2016-02-05 MED ORDER — ONDANSETRON HCL 4 MG/2ML IJ SOLN
INTRAMUSCULAR | Status: DC | PRN
  Administered 2016-02-05: 4 mg via INTRAVENOUS

## 2016-02-05 MED ORDER — MIDAZOLAM HCL 2 MG/2ML IJ SOLN
INTRAMUSCULAR | Status: DC | PRN
  Administered 2016-02-05: 2 mg via INTRAVENOUS

## 2016-02-05 MED ORDER — FENTANYL CITRATE (PF) 100 MCG/2ML IJ SOLN
25.0000 ug | INTRAMUSCULAR | Status: DC | PRN
  Administered 2016-02-05 (×4): 25 ug via INTRAVENOUS

## 2016-02-05 MED ORDER — FENTANYL CITRATE (PF) 100 MCG/2ML IJ SOLN
INTRAMUSCULAR | Status: AC
  Filled 2016-02-05: qty 2

## 2016-02-05 MED ORDER — ENOXAPARIN SODIUM 40 MG/0.4ML ~~LOC~~ SOLN
40.0000 mg | SUBCUTANEOUS | Status: DC
  Administered 2016-02-06 – 2016-02-09 (×4): 40 mg via SUBCUTANEOUS
  Filled 2016-02-05 (×4): qty 0.4

## 2016-02-05 MED ORDER — FAMOTIDINE 20 MG PO TABS
ORAL_TABLET | ORAL | Status: AC
Start: 2016-02-05 — End: 2016-02-05
  Administered 2016-02-05: 20 mg via ORAL
  Filled 2016-02-05: qty 1

## 2016-02-05 MED ORDER — MEPERIDINE HCL 25 MG/ML IJ SOLN: 6.2500 mg | INTRAMUSCULAR | Status: DC | PRN

## 2016-02-05 MED ORDER — INFLUENZA VAC SPLIT QUAD 0.5 ML IM SUSY
0.5000 mL | PREFILLED_SYRINGE | INTRAMUSCULAR | Status: AC
Start: 2016-02-06 — End: 2016-02-07
  Administered 2016-02-07: 0.5 mL via INTRAMUSCULAR
  Filled 2016-02-05: qty 0.5

## 2016-02-05 MED ORDER — MORPHINE SULFATE (PF) 2 MG/ML IV SOLN
2.0000 mg | INTRAVENOUS | Status: DC | PRN
  Administered 2016-02-05 – 2016-02-08 (×7): 2 mg via INTRAVENOUS
  Filled 2016-02-05 (×8): qty 1

## 2016-02-05 MED ORDER — OXYCODONE HCL 5 MG/5ML PO SOLN: 5.0000 mg | Freq: Once | ORAL | Status: DC | PRN

## 2016-02-05 MED ORDER — GLYCOPYRROLATE 0.2 MG/ML IJ SOLN
INTRAMUSCULAR | Status: DC | PRN
  Administered 2016-02-05: .8 mg via INTRAVENOUS

## 2016-02-05 MED ORDER — ONDANSETRON HCL 4 MG/2ML IJ SOLN
4.0000 mg | Freq: Four times a day (QID) | INTRAMUSCULAR | Status: DC | PRN
  Administered 2016-02-05: 4 mg via INTRAVENOUS
  Filled 2016-02-05: qty 2

## 2016-02-05 MED ORDER — ACETAMINOPHEN 10 MG/ML IV SOLN
INTRAVENOUS | Status: AC
  Filled 2016-02-05: qty 100

## 2016-02-05 MED ORDER — ACETAMINOPHEN 325 MG PO TABS
650.0000 mg | ORAL_TABLET | Freq: Four times a day (QID) | ORAL | Status: DC | PRN
  Administered 2016-02-08 – 2016-02-09 (×4): 650 mg via ORAL
  Filled 2016-02-05 (×4): qty 2

## 2016-02-05 MED ORDER — LIDOCAINE HCL (CARDIAC) 20 MG/ML IV SOLN
INTRAVENOUS | Status: DC | PRN
  Administered 2016-02-05: 80 mg via INTRAVENOUS

## 2016-02-05 MED ORDER — SODIUM CHLORIDE 0.45 % IV SOLN
INTRAVENOUS | Status: DC
  Administered 2016-02-05 – 2016-02-09 (×5): via INTRAVENOUS

## 2016-02-05 MED ORDER — ALVIMOPAN 12 MG PO CAPS
ORAL_CAPSULE | ORAL | Status: AC
  Administered 2016-02-05: 12 mg via ORAL
  Filled 2016-02-05: qty 1

## 2016-02-05 MED ORDER — CEFAZOLIN SODIUM-DEXTROSE 2-4 GM/100ML-% IV SOLN
2.0000 g | INTRAVENOUS | Status: AC
  Administered 2016-02-05: 2 g via INTRAVENOUS

## 2016-02-05 MED ORDER — ALVIMOPAN 12 MG PO CAPS
12.0000 mg | ORAL_CAPSULE | Freq: Two times a day (BID) | ORAL | Status: DC
  Administered 2016-02-06 – 2016-02-10 (×9): 12 mg via ORAL
  Filled 2016-02-05 (×9): qty 1

## 2016-02-05 MED ORDER — PHENYLEPHRINE HCL 10 MG/ML IJ SOLN
INTRAMUSCULAR | Status: DC | PRN
  Administered 2016-02-05: 200 ug via INTRAVENOUS
  Administered 2016-02-05 (×2): 50 ug via INTRAVENOUS

## 2016-02-05 MED ORDER — SUCCINYLCHOLINE CHLORIDE 20 MG/ML IJ SOLN
INTRAMUSCULAR | Status: DC | PRN
  Administered 2016-02-05: 80 mg via INTRAVENOUS

## 2016-02-05 MED ORDER — PANTOPRAZOLE SODIUM 40 MG PO TBEC
40.0000 mg | DELAYED_RELEASE_TABLET | Freq: Every day | ORAL | Status: DC
Start: 2016-02-06 — End: 2016-02-10
  Administered 2016-02-06 – 2016-02-10 (×5): 40 mg via ORAL
  Filled 2016-02-05 (×5): qty 1

## 2016-02-05 MED ORDER — FAMOTIDINE 20 MG PO TABS
20.0000 mg | ORAL_TABLET | Freq: Once | ORAL | Status: AC
  Administered 2016-02-05: 20 mg via ORAL

## 2016-02-05 MED ORDER — OXYCODONE HCL 5 MG PO TABS: 5.0000 mg | ORAL_TABLET | Freq: Once | ORAL | Status: DC | PRN

## 2016-02-05 MED ORDER — ROCURONIUM BROMIDE 100 MG/10ML IV SOLN
INTRAVENOUS | Status: DC | PRN
  Administered 2016-02-05: 10 mg via INTRAVENOUS
  Administered 2016-02-05: 5 mg via INTRAVENOUS
  Administered 2016-02-05: 20 mg via INTRAVENOUS
  Administered 2016-02-05: 30 mg via INTRAVENOUS

## 2016-02-05 MED ORDER — NEOSTIGMINE METHYLSULFATE 10 MG/10ML IV SOLN
INTRAVENOUS | Status: DC | PRN
  Administered 2016-02-05: 4 mg via INTRAVENOUS

## 2016-02-05 MED ORDER — KETOROLAC TROMETHAMINE 30 MG/ML IJ SOLN
INTRAMUSCULAR | Status: DC | PRN
  Administered 2016-02-05: 30 mg via INTRAVENOUS

## 2016-02-05 MED ORDER — AMLODIPINE BESYLATE 5 MG PO TABS
5.0000 mg | ORAL_TABLET | Freq: Every day | ORAL | Status: DC
  Administered 2016-02-06 – 2016-02-10 (×5): 5 mg via ORAL
  Filled 2016-02-05 (×5): qty 1

## 2016-02-05 MED ORDER — FENTANYL CITRATE (PF) 100 MCG/2ML IJ SOLN
INTRAMUSCULAR | Status: DC | PRN
  Administered 2016-02-05: 50 ug via INTRAVENOUS
  Administered 2016-02-05: 100 ug via INTRAVENOUS
  Administered 2016-02-05: 50 ug via INTRAVENOUS

## 2016-02-05 MED ORDER — CEFAZOLIN SODIUM-DEXTROSE 2-4 GM/100ML-% IV SOLN
INTRAVENOUS | Status: AC
  Administered 2016-02-05: 2 g via INTRAVENOUS
  Filled 2016-02-05: qty 100

## 2016-02-05 MED ORDER — ALVIMOPAN 12 MG PO CAPS
12.0000 mg | ORAL_CAPSULE | Freq: Once | ORAL | Status: AC
  Administered 2016-02-05: 12 mg via ORAL

## 2016-02-05 SURGICAL SUPPLY — 71 items
BLADE SURG 10 STRL SS SAFETY (BLADE) IMPLANT
BLADE SURG 11 STRL SS SAFETY (MISCELLANEOUS) ×3 IMPLANT
BLADE SURG 15 STRL SS SAFETY (BLADE) IMPLANT
CANISTER SUCT 1200ML W/VALVE (MISCELLANEOUS) ×3 IMPLANT
CANNULA DILATOR 10 W/SLV (CANNULA) ×3 IMPLANT
CATH TRAY 16F METER LATEX (MISCELLANEOUS) ×3 IMPLANT
CHLORAPREP W/TINT 26ML (MISCELLANEOUS) ×3 IMPLANT
CLEANER CAUTERY TIP 5X5 PAD (MISCELLANEOUS) IMPLANT
CORD MONOPOLAR M/FML 12FT (MISCELLANEOUS) ×3 IMPLANT
DEFOGGER SCOPE WARMER CLEARIFY (MISCELLANEOUS) ×3 IMPLANT
DERMABOND ADVANCED (GAUZE/BANDAGES/DRESSINGS) ×1
DERMABOND ADVANCED .7 DNX12 (GAUZE/BANDAGES/DRESSINGS) ×2 IMPLANT
DRAPE C-SECTION (MISCELLANEOUS) ×3 IMPLANT
DRAPE INCISE IOBAN 66X45 STRL (DRAPES) ×3 IMPLANT
DRAPE LAPAROTOMY 100X77 ABD (DRAPES) IMPLANT
DRSG TEGADERM 6X8 (GAUZE/BANDAGES/DRESSINGS) IMPLANT
ELECT REM PT RETURN 9FT ADLT (ELECTROSURGICAL) ×3
ELECTRODE REM PT RTRN 9FT ADLT (ELECTROSURGICAL) ×2 IMPLANT
GLOVE BIO SURGEON STRL SZ7 (GLOVE) ×24 IMPLANT
GOWN STRL REUS W/ TWL LRG LVL3 (GOWN DISPOSABLE) ×16 IMPLANT
GOWN STRL REUS W/TWL LRG LVL3 (GOWN DISPOSABLE) ×8
GRASPER SUT TROCAR 14GX15 (MISCELLANEOUS) ×3 IMPLANT
HANDLE YANKAUER SUCT BULB TIP (MISCELLANEOUS) IMPLANT
HOLDER FOLEY CATH W/STRAP (MISCELLANEOUS) ×3 IMPLANT
IRRIGATION STRYKERFLOW (MISCELLANEOUS) IMPLANT
IRRIGATOR STRYKERFLOW (MISCELLANEOUS)
IV LACTATED RINGERS 1000ML (IV SOLUTION) IMPLANT
KIT PINK PAD W/HEAD ARE REST (MISCELLANEOUS) ×3
KIT PINK PAD W/HEAD ARM REST (MISCELLANEOUS) ×2 IMPLANT
KIT RM TURNOVER STRD PROC AR (KITS) ×3 IMPLANT
LABEL OR SOLS (LABEL) IMPLANT
LIQUID BAND (GAUZE/BANDAGES/DRESSINGS) IMPLANT
NDL INSUFF ACCESS 14 VERSASTEP (NEEDLE) ×3 IMPLANT
NS IRRIG 1000ML POUR BTL (IV SOLUTION) ×3 IMPLANT
NS IRRIG 500ML POUR BTL (IV SOLUTION) ×3 IMPLANT
PACK BASIN MAJOR ARMC (MISCELLANEOUS) ×3 IMPLANT
PACK COLON CLEAN CLOSURE (MISCELLANEOUS) ×3 IMPLANT
PACK LAP CHOLECYSTECTOMY (MISCELLANEOUS) IMPLANT
PAD CLEANER CAUTERY TIP 5X5 (MISCELLANEOUS)
PENCIL ELECTRO HAND CTR (MISCELLANEOUS) IMPLANT
POUCH ENDO CATCH 10MM SPEC (MISCELLANEOUS) IMPLANT
RELOAD PROXIMATE 75MM BLUE (ENDOMECHANICALS) ×3 IMPLANT
SCISSORS METZENBAUM CVD 33 (INSTRUMENTS) ×3 IMPLANT
SET YANKAUER POOLE SUCT (MISCELLANEOUS) ×3 IMPLANT
SHEARS HARMONIC ACE PLUS 36CM (ENDOMECHANICALS) IMPLANT
SLEEVE ENDOPATH XCEL 5M (ENDOMECHANICALS) ×3 IMPLANT
SPONGE LAP 18X18 5 PK (GAUZE/BANDAGES/DRESSINGS) IMPLANT
STAPLER PROXIMATE 75MM BLUE (STAPLE) ×3 IMPLANT
STAPLER SKIN PROX 35W (STAPLE) ×3 IMPLANT
SUT MNCRL AB 3-0 PS2 27 (SUTURE) ×3 IMPLANT
SUT PROLENE 0 CT 1 30 (SUTURE) ×9 IMPLANT
SUT SILK 2 0 (SUTURE)
SUT SILK 2-0 30XBRD TIE 12 (SUTURE) IMPLANT
SUT SILK 3-0 (SUTURE) ×7 IMPLANT
SUT SILK 3-0 SH-1 18XCR BRD (SUTURE) ×2
SUT VIC AB 0 SH 27 (SUTURE) IMPLANT
SUT VIC AB 2-0 BRD 54 (SUTURE) IMPLANT
SUT VIC AB 2-0 CT1 (SUTURE) ×3 IMPLANT
SUT VIC AB 2-0 CT1 27 (SUTURE) ×1
SUT VIC AB 2-0 CT1 TAPERPNT 27 (SUTURE) ×2 IMPLANT
SUT VIC AB 3-0 54X BRD REEL (SUTURE) ×2 IMPLANT
SUT VIC AB 3-0 BRD 54 (SUTURE) ×1
SUT VIC AB 3-0 SH 27 (SUTURE) ×2
SUT VIC AB 3-0 SH 27X BRD (SUTURE) ×4 IMPLANT
SUT VIC AB 4-0 FS2 27 (SUTURE) ×3 IMPLANT
SUTURE SILK 3-0 SH-1 18XCR BRD (SUTURE) ×2 IMPLANT
SYR BULB IRRIG 60ML STRL (SYRINGE) IMPLANT
SYRINGE 10CC LL (SYRINGE) ×3 IMPLANT
TROCAR XCEL NON-BLD 5MMX100MML (ENDOMECHANICALS) ×3 IMPLANT
TUBING INSUFFLATOR HEATED (MISCELLANEOUS) ×3 IMPLANT
TUBING INSUFFLATOR HI FLOW (MISCELLANEOUS) IMPLANT

## 2016-02-05 NOTE — Anesthesia Postprocedure Evaluation (Signed)
Anesthesia Post Note  Patient: Scott [name]  Procedure(s) Performed: Procedure(s) (LRB): EXPLORATORY LAPAROTOMY FOR SMALL BOWEL OBSTRUCTION (N/A) LYSIS OF ADHESION  Patient location during evaluation: PACU Anesthesia Type: General Level of consciousness: awake and alert and oriented Pain management: pain level controlled Vital Signs Assessment: post-procedure vital signs reviewed and stable Respiratory status: spontaneous breathing, nonlabored ventilation and respiratory function stable Cardiovascular status: blood pressure returned to baseline and stable Postop Assessment: no signs of nausea or vomiting Anesthetic complications: no    Last Vitals:  Vitals:   02/05/16 0959 02/05/16 1015  BP:  101/71  Pulse:  72  Resp:  11  Temp: 36.8 C     Last Pain:  Vitals:   02/05/16 1055  TempSrc:   PainSc: 3                  [name]

## 2016-02-05 NOTE — Interval H&P Note (Signed)
History and Physical Interval Note:  02/05/2016 7:09 AM  Scott [name]  has presented today for surgery, with the diagnosis of POSSIBLE SMALL BOWEL OBSTRUCTION  The various methods of treatment have been discussed with the patient and family. After consideration of risks, benefits and other options for treatment, the patient has consented to  Procedure(s): LAPAROSCOPY DIAGNOSTIC (N/A) EXPLORATORY LAPAROTOMY FOR SMALL BOWEL OBSTRUCTION (N/A) as a surgical intervention .  The patient's history has been reviewed, patient examined, no change in status, stable for surgery.  I have reviewed the patient's chart and labs.  Questions were answered to the patient's satisfaction.     Imran Nuon G

## 2016-02-05 NOTE — Anesthesia Procedure Notes (Signed)
Procedure Name: Intubation Performed by: Aleesa Sweigert Pre-anesthesia Checklist: Patient identified, Patient being monitored, Timeout performed, Emergency Drugs available and Suction available Patient Re-evaluated:Patient Re-evaluated prior to inductionOxygen Delivery Method: Circle system utilized Preoxygenation: Pre-oxygenation with 100% oxygen Intubation Type: IV induction Ventilation: Mask ventilation without difficulty and Oral airway inserted - appropriate to patient size Laryngoscope Size: Mac and 3 Grade View: Grade I Tube type: Oral Tube size: 7.5 mm Number of attempts: 1 Airway Equipment and Method: Stylet Placement Confirmation: ETT inserted through vocal cords under direct vision,  positive ETCO2 and breath sounds checked- equal and bilateral Secured at: 22 cm Tube secured with: Tape Dental Injury: Teeth and Oropharynx as per pre-operative assessment        

## 2016-02-05 NOTE — Op Note (Signed)
Preop diagnosis: Recurring partial small bowel obstruction  Post op diagnosis: Same secondary to anastomotic the stricture plus the dense adhesions  Operation: Laparotomy lysis of adhesions repair of an enterotomy and segmental small bowel resection  Surgeon: Kathreen CosierS. G. Trenita Hulme  Assistant: Roland RackJayme Benson, RN, FA   Anesthesia: Gen.  Complications: None  EBL: Less than 50 mL  Drains: None  Description: Patient was put to sleep in the supine position the operating table and Foley catheter was inserted. Abdomen was prepped and draped as sterile field and timeout performed. Attempted made to do a laparoscopy first. The previous incision just above the umbilicus and a small opening was made gently carried down through the fascia it and was noted that the the peritoneal cavity was not entered into but there was bowel adherent to the incision. This was not suitable for a laparoscopic port. It was therefore decided to open the incision and converted to a laparotomy. The incision was then carefully opened and the taking care to avoid the injury to the bowel that was adherent to the previous closure. After laparotomy incision was completed lysis of the adhesions to the anterior abdominal wall was carried out in the course of fluid is one small area was densely adherent just close to the umbilicus and a small enterotomy was made. This was subsequently repaired with 3-0 Vicryl and seromuscular 3-0 silk stitches and a transverse orientation. After the adhesions were freed which occupied a good part of from 45 minutes to an hour the small bowel was inspected. The actual obstructing element appeared to be at the site of his previous small bowel resection for Meckel's the anastomosis. Here the proximal bowel was mildly distended and the distal bowel was collapsed and appeared that the anastomotic opening had been narrowed. Within the lumen or palpable inspissated material. It hard to facilitate the relief of this  obstruction is segmental resection of this was planned. Mesentery was then scored just distal to the area of the previous anastomosis and the mesentery was taken down with the Harmonic scalpel. The side to side anastomosis was then performed using an GIA stapling device and the segment of bowel resected and the remaining opening closed with a second load of the GIA. The anastomotic side was reinforced with a horizontal staple line with 3-0 silk. Mesenteric opening was closed with 3-0 Vicryl. The new anastomoses was about 2 fingerbreadths in size. The bowel was then placed back in the abdominal cavity. The cavity was irrigated with some saline mounting to 500 mL. The omentum was brought down but it appeared to be tablet someway superiorly and laterally, part of the way to the mid abdominal area. The fascia was then closed with the interrupted figure-of-eight stitches of 0 Prolene. Skin was closed with the subcuticular 3-0 Monocryl and covered with Dermabond. The resected small segment of bowel with the previous anastomosis was opened on the proximal portion of this bowel and noted that there were 2 large pieces of vegetation material that were lodged in there and in addition the anastomotic opening was down to about a fingerbreadth or less in size. The segment was sent to pathology in formalin. Patient tolerated the procedure with no immediate problems encountered and he was subsequently extubated and returned recovery room stable condition.

## 2016-02-05 NOTE — Progress Notes (Signed)
Notified Dr. Evette [name] of patient's hypotension; IV fluid rate increased.  Patient asymptomatic at this time.

## 2016-02-05 NOTE — H&P (View-Only) (Signed)
Patient ID: Scott [name], male   DOB: 10/18/1960, 55 y.o.   MRN: [id number]  Chief Complaint  Patient presents with  . Other    discuss x-ray    HPI Scott [name] is a 55 y.o. male here today to discuss x-ray that was done on10/18/17. He states he is weak feeling. Patient has not moved his bowels in three days. He had episode of abdominal bloating and vomiting 2 nights ago, similar to prior episodes of small bowel obstruction. He was placed on liquid diet yesterday and he has not had any vomiting since. I have reviewed the history of present illness with the patient.   HPI  Past Medical History:  Diagnosis Date  . Diabetes mellitus without complication (HCC)   . Hypertension     Past Surgical History:  Procedure Laterality Date  . APPENDECTOMY    . CHOLECYSTECTOMY      Family History  Problem Relation Age of Onset  . Heart disease Mother   . Hypertension Mother   . Diabetes Sister   . Diabetes Brother     Social History Social History  Substance Use Topics  . Smoking status: Never Smoker  . Smokeless tobacco: Not on file  . Alcohol use 0.0 oz/week     Comment: occasionally    No Known Allergies  Current Outpatient Prescriptions  Medication Sig Dispense Refill  . amLODipine (NORVASC) 5 MG tablet TAKE 1 TABLET BY MOUTH EVERY DAY 30 tablet 11  . glucose blood test strip Use as instructed 100 each 12  . metFORMIN (GLUCOPHAGE) 1000 MG tablet Take 1 tablet (1,000 mg total) by mouth 2 (two) times daily with a meal. 60 tablet 11   No current facility-administered medications for this visit.     Review of Systems Review of Systems  Constitutional: Negative.   Respiratory: Negative.   Cardiovascular: Negative.     Blood pressure 102/64, pulse 70, resp. rate 12, height 5' 7" (1.702 m), weight 146 lb (66.2 kg).  Physical Exam Physical Exam  Constitutional: He is oriented to person, place, and time. He appears well-developed and  well-nourished.  Eyes: Conjunctivae are normal. No scleral icterus.  Neck: Neck supple.  Cardiovascular: Normal rate, regular rhythm and normal heart sounds.   Pulmonary/Chest: Effort normal and breath sounds normal.  Abdominal: Soft. Normal appearance and bowel sounds are normal. He exhibits no distension and no mass. There is no hepatomegaly. There is no tenderness. No hernia.    Lymphadenopathy:    He has no cervical adenopathy.  Neurological: He is alert and oriented to person, place, and time.  Skin: Skin is warm and dry.    Data Reviewed X-ray reviewed   Assessment    Abdominal xray yesterday showed mild small bowel dilatation-not as prominent as his prior episodes. Based on prior CT and SBFT sudy it appears he has focal area of partial obstruction in RLQ area-likely from his prior small bowel resection at that site. He may benefit with laparoscopy and/or laparotomy to correct this. He was advised fully and he is agreeable.    Plan   Patient to eat light meals.  Patient to be scheduled for laparoscopy, possible laparotomy and bowel resection     This information has been scribed by Jessica Qualls CMA.   Jermarcus Mcfadyen G 01/31/2016, 8:58 AM   

## 2016-02-05 NOTE — Transfer of Care (Signed)
Immediate Anesthesia Transfer of Care Note  Patient: Scott [name]  Procedure(s) Performed: Procedure(s): EXPLORATORY LAPAROTOMY FOR SMALL BOWEL OBSTRUCTION (N/A) LYSIS OF ADHESION  Patient Location: PACU  Anesthesia Type:General  Level of Consciousness: sedated and responds to stimulation  Airway & Oxygen Therapy: Patient Spontanous Breathing and Patient connected to face mask oxygen  Post-op Assessment: Report given to RN and Post -op Vital signs reviewed and stable  Post vital signs: Reviewed and stable  Last Vitals:  Vitals:   02/05/16 0559 02/05/16 0957  BP: 121/87 125/81  Pulse: 71 76  Resp: 16 14  Temp: 36.1 C     Last Pain:  Vitals:   02/05/16 0559  TempSrc: Tympanic  PainSc: 0-No pain         Complications: No apparent anesthesia complications

## 2016-02-05 NOTE — Anesthesia Preprocedure Evaluation (Signed)
Anesthesia Evaluation  Patient identified by MRN, date of birth, ID band Patient awake    Reviewed: Allergy & Precautions, NPO status , Patient's Chart, lab work & pertinent test results  History of Anesthesia Complications Negative for: history of anesthetic complications  Airway Mallampati: II  TM Distance: >3 FB Neck ROM: Full    Dental  (+) Poor Dentition   Pulmonary neg pulmonary ROS, neg sleep apnea, neg COPD,    breath sounds clear to auscultation- rhonchi (-) wheezing      Cardiovascular Exercise Tolerance: Good hypertension, Pt. on medications (-) CAD and (-) Past MI  Rhythm:Regular Rate:Normal - Systolic murmurs and - Diastolic murmurs    Neuro/Psych negative neurological ROS  negative psych ROS   GI/Hepatic negative GI ROS, Neg liver ROS,   Endo/Other  diabetes, Type 2, Oral Hypoglycemic Agents  Renal/GU negative Renal ROS     Musculoskeletal negative musculoskeletal ROS (+)   Abdominal (+) - obese,   Peds  Hematology negative hematology ROS (+)   Anesthesia Other Findings Past Medical History: No date: Diabetes mellitus without complication (HCC) No date: History of kidney stones     Comment: H/O STONES No date: Hypertension   Reproductive/Obstetrics                             Anesthesia Physical Anesthesia Plan  ASA: II  Anesthesia Plan: General   Post-op Pain Management:    Induction: Intravenous  Airway Management Planned: Oral ETT  Additional Equipment:   Intra-op Plan:   Post-operative Plan: Extubation in OR  Informed Consent: I have reviewed the patients History and Physical, chart, labs and discussed the procedure including the risks, benefits and alternatives for the proposed anesthesia with the patient or authorized representative who has indicated his/her understanding and acceptance.   Dental advisory given  Plan Discussed with: CRNA and  Anesthesiologist  Anesthesia Plan Comments:         Anesthesia Quick Evaluation

## 2016-02-06 ENCOUNTER — Encounter: Payer: Self-pay | Admitting: General Surgery

## 2016-02-06 ENCOUNTER — Inpatient Hospital Stay: Payer: BLUE CROSS/BLUE SHIELD

## 2016-02-06 LAB — BASIC METABOLIC PANEL
Anion gap: 5 (ref 5–15)
BUN: 6 mg/dL (ref 6–20)
CHLORIDE: 104 mmol/L (ref 101–111)
CO2: 27 mmol/L (ref 22–32)
CREATININE: 0.85 mg/dL (ref 0.61–1.24)
Calcium: 8.4 mg/dL — ABNORMAL LOW (ref 8.9–10.3)
GFR calc Af Amer: >60 mL/min (ref >60)
GFR calc non Af Amer: >60 mL/min (ref >60)
GLUCOSE: 97 mg/dL (ref 65–99)
Potassium: 3.9 mmol/L (ref 3.5–5.1)
SODIUM: 136 mmol/L (ref 135–145)

## 2016-02-06 LAB — TROPONIN I
Troponin I: <0.03 ng/mL (ref <0.03)
Troponin I: <0.03 ng/mL (ref <0.03)

## 2016-02-06 LAB — GLUCOSE, CAPILLARY
GLUCOSE-CAPILLARY: 121 mg/dL — AB (ref 65–99)
Glucose-Capillary: 104 mg/dL — ABNORMAL HIGH (ref 65–99)
Glucose-Capillary: 87 mg/dL (ref 65–99)

## 2016-02-06 LAB — CBC
HCT: 40.1 % (ref 40.0–52.0)
Hemoglobin: 13.7 g/dL (ref 13.0–18.0)
MCH: 30.2 pg (ref 26.0–34.0)
MCHC: 34.3 g/dL (ref 32.0–36.0)
MCV: 88 fL (ref 80.0–100.0)
PLATELETS: 196 10*3/uL (ref 150–440)
RBC: 4.55 MIL/uL (ref 4.40–5.90)
RDW: 12.9 % (ref 11.5–14.5)
WBC: 10.5 10*3/uL (ref 3.8–10.6)

## 2016-02-06 LAB — CK: Total CK: 24 U/L — ABNORMAL LOW (ref 49–397)

## 2016-02-06 LAB — SURGICAL PATHOLOGY

## 2016-02-06 MED ORDER — IOPAMIDOL (ISOVUE-370) INJECTION 76%
75.0000 mL | Freq: Once | INTRAVENOUS | Status: AC | PRN
  Administered 2016-02-06: 75 mL via INTRAVENOUS

## 2016-02-06 MED ORDER — MORPHINE SULFATE (PF) 2 MG/ML IV SOLN
2.0000 mg | INTRAVENOUS | Status: AC
  Administered 2016-02-06: 2 mg via INTRAVENOUS

## 2016-02-06 MED ORDER — INSULIN ASPART 100 UNIT/ML ~~LOC~~ SOLN
0.0000 [IU] | Freq: Three times a day (TID) | SUBCUTANEOUS | Status: DC
  Administered 2016-02-08: 1 [IU] via SUBCUTANEOUS
  Filled 2016-02-06: qty 1

## 2016-02-06 MED ORDER — ALUM & MAG HYDROXIDE-SIMETH 200-200-20 MG/5ML PO SUSP
30.0000 mL | Freq: Four times a day (QID) | ORAL | Status: DC | PRN
  Administered 2016-02-06: 30 mL via ORAL
  Filled 2016-02-06: qty 30

## 2016-02-06 NOTE — Progress Notes (Signed)
Patient ID: Scott BudJames G Quint III, male   DOB: [birth year], 55 y.o.   MRN: [id number] After my visit this am pt experienced some pain bllow left breast area-no radiation, no change in VS. EKG, troponin and CT chest normal. Likely stomach related.  I will be away from this afternoon. Dr Renda RollsWilton Smith will round on pt next few days. He was advised on the surgery and today's events

## 2016-02-06 NOTE — Progress Notes (Signed)
Patient ID: Scott [name], male   DOB: January 28, 1961, 55 y.o.   MRN: [id number] Pt with no complaints. Has been up walking. Good pain control. No n/v. Abdomen is soft, good bowel sounds. Incision clean and intact Lungs clear. U/O, labs, VS all ok. Doing well. Start clear liqs today. D/C foley

## 2016-02-06 NOTE — Consult Note (Signed)
[facility] HOSPITALIST  Medical Consultation  Scott [name] RUE:[id number] DOB: 1961-03-26 DOA: 02/05/2016 PCP: Filbert Berthold, NP   Requesting physician: Dr. Evette Cristal Date of consultation: 02/06/16 Reason for consultation: chest pain CHIEF COMPLAINT:  No chief complaint on file.   HISTORY OF PRESENT ILLNESS: Scott [name]  is a 55 y.o. male with a known history of hypertension, diabetes who yesterday underwent laparotomy lysis of adhesions, repair of enterotomy and segmental small bowel resection. Patient this morning about an hour ago started having left-sided sharp chest pain. He was treated for indigestion. However his symptoms improve and then came back. Therefore we are asked to see the patient. Patient describes it as a sharp type of pain without any radiation. He denies any shortness of breath. Has not had a bowel movement or passed gas. His abdomen is slightly distended. PAST MEDICAL HISTORY:   Past Medical History:  Diagnosis Date  . Diabetes mellitus without complication (HCC)   . History of kidney stones    H/O STONES  . Hypertension     PAST SURGICAL HISTORY: Past Surgical History:  Procedure Laterality Date  . APPENDECTOMY    . CHOLECYSTECTOMY    . LAPAROTOMY N/A 02/05/2016   Procedure: EXPLORATORY LAPAROTOMY FOR SMALL BOWEL OBSTRUCTION;  Surgeon: Kieth Brightly, MD;  Location: [facility] ORS;  Service: General;  Laterality: N/A;  . LYSIS OF ADHESION  02/05/2016   Procedure: LYSIS OF ADHESION;  Surgeon: Kieth Brightly, MD;  Location: [facility] ORS;  Service: General;;  . SMALL INTESTINE SURGERY  2005    SOCIAL HISTORY:  Social History  Substance Use Topics  . Smoking status: Never Smoker  . Smokeless tobacco: Never Used  . Alcohol use No    FAMILY HISTORY:  Family History  Problem Relation Age of Onset  . Heart disease Mother   . Hypertension Mother   . Diabetes Sister   . Diabetes Brother     DRUG ALLERGIES: No Known Allergies  REVIEW OF SYSTEMS:    CONSTITUTIONAL: No fever, fatigue or weakness.  EYES: No blurred or double vision.  EARS, NOSE, AND THROAT: No tinnitus or ear pain.  RESPIRATORY: No cough, shortness of breath, wheezing or hemoptysis.  CARDIOVASCULAR: Positive chest pain, orthopnea, edema.  GASTROINTESTINAL: No nausea, vomiting, diarrhea or abdominal pain.  GENITOURINARY: No dysuria, hematuria.  ENDOCRINE: No polyuria, nocturia,  HEMATOLOGY: No anemia, easy bruising or bleeding SKIN: No rash or lesion. MUSCULOSKELETAL: No joint pain or arthritis.   NEUROLOGIC: No tingling, numbness, weakness.  PSYCHIATRY: No anxiety or depression.   MEDICATIONS AT HOME:  Prior to Admission medications   Medication Sig Start Date End Date Taking? Authorizing Provider  amLODipine (NORVASC) 5 MG tablet TAKE 1 TABLET BY MOUTH EVERY DAY Patient taking differently: TAKE 1 TABLET BY MOUTH EVERY DAY-AM 11/01/15  Yes Amy Rusty Aus, NP  glucose blood test strip Use as instructed 10/29/15  Yes Amy Rusty Aus, NP  metFORMIN (GLUCOPHAGE) 1000 MG tablet Take 1 tablet (1,000 mg total) by mouth 2 (two) times daily with a meal. Patient taking differently: Take 500 mg by mouth daily with breakfast.  10/29/15  Yes Amy Rusty Aus, NP      PHYSICAL EXAMINATION:   VITAL SIGNS: Blood pressure (!) 134/93, pulse 86, temperature 97.6 F (36.4 C), temperature source Oral, resp. rate 17, height 5\' 7"  (1.702 m), weight 155 lb 6.4 oz (70.5 kg), SpO2 100 %.  GENERAL:  55 y.o.-year-old patient lying in the bed with no acute distress.  EYES:  Pupils equal, round, reactive to light and accommodation. No scleral icterus. Extraocular muscles intact.  HEENT: Head atraumatic, normocephalic. Oropharynx and nasopharynx clear.  NECK:  Supple, no jugular venous distention. No thyroid enlargement, no tenderness.  LUNGS: Normal breath sounds bilaterally, no wheezing, rales,rhonchi or crepitation. No use of accessory muscles of respiration.  CARDIOVASCULAR: S1, S2  normal. No murmurs, rubs, or gallops.  ABDOMEN: Abdomen is distended diminished bowel sounds there is no guarding no rebound.  EXTREMITIES: No pedal edema, cyanosis, or clubbing.  NEUROLOGIC: Cranial nerves II through XII are intact. Muscle strength 5/5 in all extremities. Sensation intact. Gait not checked.  PSYCHIATRIC: The patient is alert and oriented x 3.  SKIN: No obvious rash, lesion, or ulcer.   LABORATORY PANEL:   CBC  Recent Labs Lab 01/31/16 0906 02/05/16 1150 02/06/16 0532  WBC 10.2 16.6* 10.5  HGB  --  14.3 13.7  HCT 45.4 41.1 40.1  PLT 244 218 196  MCV 87 87.7 88.0  MCH 30.1 30.5 30.2  MCHC 34.6 34.8 34.3  RDW 13.3 13.1 12.9  LYMPHSABS 1.9  --   --   EOSABS 0.2  --   --   BASOSABS 0.0  --   --    ------------------------------------------------------------------------------------------------------------------  Chemistries   Recent Labs Lab 01/31/16 0906 02/05/16 1150 02/06/16 0532  NA 140  --  136  K 5.0  --  3.9  CL 96  --  104  CO2 30*  --  27  GLUCOSE 121*  --  97  BUN 15  --  6  CREATININE 0.96 0.85 0.85  CALCIUM 9.6  --  8.4*   ------------------------------------------------------------------------------------------------------------------ estimated creatinine clearance is 94 mL/min (by C-G formula based on SCr of 0.85 mg/dL). ------------------------------------------------------------------------------------------------------------------ No results for input(s): TSH, T4TOTAL, T3FREE, THYROIDAB in the last 72 hours.  Invalid input(s): FREET3   Coagulation profile No results for input(s): INR, PROTIME in the last 168 hours. ------------------------------------------------------------------------------------------------------------------- No results for input(s): DDIMER in the last 72 hours. -------------------------------------------------------------------------------------------------------------------  Cardiac Enzymes No results for  input(s): CKMB, TROPONINI, MYOGLOBIN in the last 168 hours.  Invalid input(s): CK ------------------------------------------------------------------------------------------------------------------ Invalid input(s): POCBNP  ---------------------------------------------------------------------------------------------------------------  Urinalysis    Component Value Date/Time   COLORURINE YELLOW (A) 09/27/2015 0655   APPEARANCEUR CLEAR (A) 09/27/2015 0655   APPEARANCEUR Clear 07/15/2014 1600   LABSPEC 1.037 (H) 09/27/2015 0655   LABSPEC 1.040 07/15/2014 1600   PHURINE 5.0 09/27/2015 0655   GLUCOSEU >500 (A) 09/27/2015 0655   GLUCOSEU 50 mg/dL 16/01/9603 5409   HGBUR NEGATIVE 09/27/2015 0655   BILIRUBINUR NEGATIVE 09/27/2015 0655   BILIRUBINUR Negative 07/15/2014 1600   KETONESUR 2+ (A) 09/27/2015 0655   PROTEINUR NEGATIVE 09/27/2015 0655   NITRITE NEGATIVE 09/27/2015 0655   LEUKOCYTESUR NEGATIVE 09/27/2015 0655   LEUKOCYTESUR Negative 07/15/2014 1600     RADIOLOGY: No results found.  EKG: Orders placed or performed during the hospital encounter of 02/05/16  . EKG 12-Lead  . EKG 12-Lead  . EKG 12-Lead  . EKG 12-Lead    IMPRESSION AND PLAN: Patient is a 55 year old status post abdominal surgery presenting now has chest pain  1. Chest pain appears to be atypical EKG shows no ST changes. I will obtain troponin levels. I will obtain a CT scan to rule out pulmonary embolism. If above workup is negative suspect this is related to GI related causes. Or musculoskeletal in nature.  2. Diabetes type 2 will place him on sliding scale insulin hold medications for now  3. Hypertension  continue Norvasc for now.  4. GERD continue Protonix    All the records are reviewed and case discussed with ED provider. Management plans discussed with the patient, family and they are in agreement.  CODE STATUS:    Code Status Orders        Start     Ordered   02/05/16 1131  Full  code  Continuous     02/05/16 1130    Code Status History    Date Active Date Inactive Code Status Order ID Comments User Context   09/27/2015 12:03 PM 10/03/2015  4:47 PM Full Code [id number]  Earline MayotteJeffrey W Byrnett, MD Inpatient       TOTAL TIME TAKING CARE OF THIS PATIENT: 55 minutes.    Auburn BilberryPATEL, Colbie Danner M.D on 02/06/2016 at 10:31 AM  Between 7am to 6pm - Pager - [phone]  After 6pm go to [email or web address] - password [email or web address] [facility]  [facility]  Office  [phone]  CC: Primary care physician; Amy Diana EvesL Krebs, NP

## 2016-02-07 ENCOUNTER — Inpatient Hospital Stay: Payer: BLUE CROSS/BLUE SHIELD

## 2016-02-07 LAB — BASIC METABOLIC PANEL
ANION GAP: 7 (ref 5–15)
BUN: 5 mg/dL — ABNORMAL LOW (ref 6–20)
CHLORIDE: 100 mmol/L — AB (ref 101–111)
CO2: 29 mmol/L (ref 22–32)
CREATININE: 0.8 mg/dL (ref 0.61–1.24)
Calcium: 8.8 mg/dL — ABNORMAL LOW (ref 8.9–10.3)
GFR calc non Af Amer: >60 mL/min (ref >60)
Glucose, Bld: 115 mg/dL — ABNORMAL HIGH (ref 65–99)
POTASSIUM: 3.5 mmol/L (ref 3.5–5.1)
Sodium: 136 mmol/L (ref 135–145)

## 2016-02-07 LAB — CBC
HEMATOCRIT: 41.6 % (ref 40.0–52.0)
HEMOGLOBIN: 14.3 g/dL (ref 13.0–18.0)
MCH: 30.2 pg (ref 26.0–34.0)
MCHC: 34.3 g/dL (ref 32.0–36.0)
MCV: 88 fL (ref 80.0–100.0)
Platelets: 220 10*3/uL (ref 150–440)
RBC: 4.73 MIL/uL (ref 4.40–5.90)
RDW: 13.1 % (ref 11.5–14.5)
WBC: 11.4 10*3/uL — ABNORMAL HIGH (ref 3.8–10.6)

## 2016-02-07 LAB — GLUCOSE, CAPILLARY
GLUCOSE-CAPILLARY: 117 mg/dL — AB (ref 65–99)
GLUCOSE-CAPILLARY: 103 mg/dL — AB (ref 65–99)
Glucose-Capillary: 106 mg/dL — ABNORMAL HIGH (ref 65–99)
Glucose-Capillary: 88 mg/dL (ref 65–99)

## 2016-02-07 LAB — TROPONIN I: Troponin I: <0.03 ng/mL (ref <0.03)

## 2016-02-07 NOTE — Progress Notes (Signed)
He reports good progress 2 days after small bowel resection with lysis of adhesions. He reports he is tolerating clear liquids satisfactorily. He reports no nausea or vomiting. He has not yet passed any gas or bowel movement. He is walking in the hallway. He is having minimal discomfort in his abdomen. He reports no chest pain today. He reports breathing easily.  Medicines noted  On examination he is awake alert and oriented and in no acute distress. Lung sounds are clear. Heart regular rhythm S1 and S2. Abdomen is minimally distended and soft with minimal tympany minimal tenderness. His midline incision appears to be healing satisfactorily with glue remaining intact.  Impression satisfactory progress 2 days status post small bowel resection with lysis of adhesions  Plan is to continue IV fluids and clear liquids and ambulation in the hallway watching for bowel function.

## 2016-02-07 NOTE — Progress Notes (Signed)
Initial Nutrition Assessment  DOCUMENTATION CODES:   Not applicable  INTERVENTION:  -Diet progression as tolerated -If po intake inadequate on follow-up, recommend addition of nutritional supplement such as Ensure  NUTRITION DIAGNOSIS:   Inadequate oral intake related to acute illness as evidenced by estimated needs.  GOAL:   Patient will meet greater than or equal to 90% of their needs  MONITOR:   Diet advancement, PO intake, Labs, Weight trends  REASON FOR ASSESSMENT:   Malnutrition Screening Tool    ASSESSMENT:    55 yo male admitted with recurring partial SBO s/p lap lysis of adhesions, repair of enterotomy and segmental SB resection  Pt tolerating CL diet. No N/V/D/abdominal pain. Pt reports 10-15 pound wt loss over the past year; 6-8.8% wt loss.   Nutrition-Focused physical exam completed. Findings are WDL for fat depletion, muscle depletion, and edema.    Past Medical History:  Diagnosis Date  . Diabetes mellitus without complication (HCC)   . History of kidney stones    H/O STONES  . Hypertension     Diet Order:  Diet clear liquid Room service appropriate? Yes; Fluid consistency: Thin  Skin:  Reviewed, no issues  Last BM:  no documented BM   Labs: reviewed  Meds: 1/2 NS at 100 ml/hr, entereg  Height:   Ht Readings from Last 1 Encounters:  02/05/16 5\' 7"  (1.702 m)    Weight:   Wt Readings from Last 1 Encounters:  02/05/16 155 lb 6.4 oz (70.5 kg)    Wt Readings from Last 10 Encounters:  02/05/16 155 lb 6.4 oz (70.5 kg)  01/31/16 146 lb (66.2 kg)  01/30/16 147 lb (66.7 kg)  01/18/16 145 lb (65.8 kg)  10/29/15 156 lb (70.8 kg)  10/22/15 155 lb (70.3 kg)  09/27/15 170 lb (77.1 kg)  07/27/14 161 lb (73 kg)    BMI:  Body mass index is 24.34 kg/m.  Estimated Nutritional Needs:   Kcal:  2100-2500 kcals  Protein:  105-125 g  Fluid:  >/= 2.1L   EDUCATION NEEDS:   No education needs identified at this time  Romelle StarcherCate Everest Brod MS, RD,  LDN [phone] Pager  [phone] Weekend/On-Call Pager

## 2016-02-07 NOTE — Progress Notes (Signed)
[facility] - [place] at [facility]   PATIENT NAME: Scott [name]    MR#:  [id number]  DATE OF BIRTH:  [birth year]  SUBJECTIVE:  CHIEF COMPLAINT:  Patient was seen and examined. Tolerating clear liquid diet. No bowel movements yet. Pain is manageable. No other complaints. Denies any chest pain  REVIEW OF SYSTEMS:  CONSTITUTIONAL: No fever, fatigue or weakness.  EYES: No blurred or double vision.  EARS, NOSE, AND THROAT: No tinnitus or ear pain.  RESPIRATORY: No cough, shortness of breath, wheezing or hemoptysis.  CARDIOVASCULAR: No chest pain, orthopnea, edema.  GASTROINTESTINAL: No nausea, vomiting, diarrhea , Reports minimal abdominal pain.  GENITOURINARY: No dysuria, hematuria.  ENDOCRINE: No polyuria, nocturia,  HEMATOLOGY: No anemia, easy bruising or bleeding SKIN: No rash or lesion. MUSCULOSKELETAL: No joint pain or arthritis.   NEUROLOGIC: No tingling, numbness, weakness.  PSYCHIATRY: No anxiety or depression.   DRUG ALLERGIES:  No Known Allergies  VITALS:  Blood pressure 112/79, pulse 96, temperature 97.5 F (36.4 C), temperature source Oral, resp. rate 18, height 5\' 7"  (1.702 m), weight 70.5 kg (155 lb 6.4 oz), SpO2 98 %.  PHYSICAL EXAMINATION:  GENERAL:  55 y.o.-year-old patient lying in the bed with no acute distress.  EYES: Pupils equal, round, reactive to light and accommodation. No scleral icterus. Extraocular muscles intact.  HEENT: Head atraumatic, normocephalic. Oropharynx and nasopharynx clear.  NECK:  Supple, no jugular venous distention. No thyroid enlargement, no tenderness.  LUNGS: Normal breath sounds bilaterally, no wheezing, rales,rhonchi or crepitation. No use of accessory muscles of respiration.  CARDIOVASCULAR: S1, S2 normal. No murmurs, rubs, or gallops.  ABDOMEN: Soft,Midline incision ,Postoperatively distended. Hypoactive bowel sounds present. EXTREMITIES: No pedal edema, cyanosis, or clubbing.  NEUROLOGIC: Cranial nerves  II through XII are intact. Muscle strength 5/5 in all extremities. Sensation intact. Gait not checked.  PSYCHIATRIC: The patient is alert and oriented x 3.  SKIN: No obvious rash, lesion, or ulcer.    LABORATORY PANEL:   CBC  Recent Labs Lab 02/07/16 0217  WBC 11.4*  HGB 14.3  HCT 41.6  PLT 220   ------------------------------------------------------------------------------------------------------------------  Chemistries   Recent Labs Lab 02/07/16 0217  NA 136  K 3.5  CL 100*  CO2 29  GLUCOSE 115*  BUN 5*  CREATININE 0.80  CALCIUM 8.8*   ------------------------------------------------------------------------------------------------------------------  Cardiac Enzymes  Recent Labs Lab 02/07/16 0217  TROPONINI <0.03   ------------------------------------------------------------------------------------------------------------------  RADIOLOGY:  Dg Chest 1 View  Result Date: 02/07/2016 CLINICAL DATA:  Shortness of breath. EXAM: CHEST 1 VIEW COMPARISON:  CT 02/06/2016.  Chest x-ray 04/26/2014. FINDINGS: Mediastinum and hilar structures are normal. Low lung volumes with mild bibasilar atelectasis. No pleural effusion or pneumothorax. Heart size normal. No free air noted on today's exam. No acute bony abnormality. IMPRESSION: 1. Low lung volumes with mild bibasilar atelectasis. 2.  Exam otherwise unremarkable. Electronically Signed   By: Maisie Fus  Register   On: 02/07/2016 06:52   Ct Angio Chest Pe W Or Wo Contrast  Result Date: 02/06/2016 CLINICAL DATA:  55 y.o. male with a known history of hypertension, diabetes who yesterday underwent laparotomy lysis of adhesions, repair of enterotomy and segmental small bowel resection. Patient this morning about an hour ago started having left-sided sharp chest pain. He was treated for indigestion. However his symptoms improve and then came back. Therefore we are asked to see the patient. Patient describes it as a sharp type of pain  without any radiation. He denies any shortness of breath. EXAM:  CT ANGIOGRAPHY CHEST WITH CONTRAST TECHNIQUE: Multidetector CT imaging of the chest was performed using the standard protocol during bolus administration of intravenous contrast. Multiplanar CT image reconstructions and MIPs were obtained to evaluate the vascular anatomy. CONTRAST:  75 mL of Isovue 370 intravenous contrast COMPARISON:  Chest radiograph, 04/26/2014 FINDINGS: Cardiovascular:  There is no evidence of a pulmonary embolism. Heart and great vessels are unremarkable. No coronary artery calcifications. Mediastinum/Nodes: No enlarged mediastinal, hilar, or axillary lymph nodes. Thyroid gland, trachea, and esophagus demonstrate no significant findings. Lungs/Pleura: Mild subsegmental atelectasis at the lung bases. Lungs otherwise clear. No pleural effusion. No pneumothorax. Upper Abdomen: Free intraperitoneal air consistent with the recent abdominal surgery. Left renal upper pole low-density mass consistent with cysts. Status postcholecystectomy. Musculoskeletal: No chest wall abnormality. No acute or significant osseous findings. Review of the MIP images confirms the above findings. IMPRESSION: 1. No evidence of a pulmonary embolism. 2. No acute findings in the chest. 3. Postoperative free intraperitoneal air. Electronically Signed   By: Amie Portlandavid  Ormond M.D.   On: 02/06/2016 12:46    EKG:   Orders placed or performed during the hospital encounter of 02/05/16  . EKG 12-Lead  . EKG 12-Lead  . EKG 12-Lead  . EKG 12-Lead    ASSESSMENT AND PLAN:   Patient is a 55 year old status post abdominal surgery presenting now has chest pain  1. Chest pain appears to be atypical-Mostly related to the GI related EKG shows no ST changes.  troponin levels.Less than 0.033 ruled out acute MI  CTA scan Chest ruled out pulmonary embolism  2. Diabetes type 2 will place him on sliding scale insulin hold medications for now  3. Hypertension  continue Norvasc for now.  4. GERD continue Protonix  5.Recurrent partial small bowel obstruction status post surgery POD #2 Laparotomy lysis of adhesions repair of an enterotomy and segmental small bowel resection Postop care per surgery  All the records are reviewed and case discussed with Care Management/Social Workerr. Management plans discussed with the patient, family and they are in agreement.  CODE STATUS: fc  TOTAL TIME TAKING CARE OF THIS PATIENT: 36  minutes.     Note: This dictation was prepared with Dragon dictation along with smaller phrase technology. Any transcriptional errors that result from this process are unintentional.   Ramonita LabGouru, Devetta Hagenow M.D on 02/07/2016 at 12:49 PM  Between 7am to 6pm - Pager - [phone] After 6pm go to [email or web address] - password [email or web address] [facility]  [facility]  Office  [phone]  CC: Primary care physician; Amy Diana EvesL Krebs, NP

## 2016-02-08 LAB — BASIC METABOLIC PANEL
Anion gap: 7 (ref 5–15)
BUN: 6 mg/dL (ref 6–20)
CALCIUM: 8.6 mg/dL — AB (ref 8.9–10.3)
CO2: 27 mmol/L (ref 22–32)
CREATININE: 0.74 mg/dL (ref 0.61–1.24)
Chloride: 103 mmol/L (ref 101–111)
GFR calc Af Amer: >60 mL/min (ref >60)
GLUCOSE: 122 mg/dL — AB (ref 65–99)
Potassium: 3.6 mmol/L (ref 3.5–5.1)
Sodium: 137 mmol/L (ref 135–145)

## 2016-02-08 LAB — GLUCOSE, CAPILLARY
Glucose-Capillary: 120 mg/dL — ABNORMAL HIGH (ref 65–99)
Glucose-Capillary: 113 mg/dL — ABNORMAL HIGH (ref 65–99)
Glucose-Capillary: 129 mg/dL — ABNORMAL HIGH (ref 65–99)
Glucose-Capillary: 94 mg/dL (ref 65–99)

## 2016-02-08 LAB — CBC
HCT: 39 % — ABNORMAL LOW (ref 40.0–52.0)
Hemoglobin: 13.4 g/dL (ref 13.0–18.0)
MCH: 30.3 pg (ref 26.0–34.0)
MCHC: 34.3 g/dL (ref 32.0–36.0)
MCV: 88.3 fL (ref 80.0–100.0)
PLATELETS: 201 10*3/uL (ref 150–440)
RBC: 4.41 MIL/uL (ref 4.40–5.90)
RDW: 12.4 % (ref 11.5–14.5)
WBC: 9.2 10*3/uL (ref 3.8–10.6)

## 2016-02-08 NOTE — Progress Notes (Signed)
[facility] - [place] at [facility]   PATIENT NAME: Scott [name]    MR#:  [id number]  DATE OF BIRTH:  1960/12/01  SUBJECTIVE:  CHIEF COMPLAINT:  Patient was seen and examined. Tolerating diet. No bowel movements yet. Pain is manageable. Positive flatus and No other complaints. Denies any chest pain  REVIEW OF SYSTEMS:  CONSTITUTIONAL: No fever, fatigue or weakness.  EYES: No blurred or double vision.  EARS, NOSE, AND THROAT: No tinnitus or ear pain.  RESPIRATORY: No cough, shortness of breath, wheezing or hemoptysis.  CARDIOVASCULAR: No chest pain, orthopnea, edema.  GASTROINTESTINAL: No nausea, vomiting, diarrhea , Reports minimal abdominal pain.  GENITOURINARY: No dysuria, hematuria.  ENDOCRINE: No polyuria, nocturia,  HEMATOLOGY: No anemia, easy bruising or bleeding SKIN: No rash or lesion. MUSCULOSKELETAL: No joint pain or arthritis.   NEUROLOGIC: No tingling, numbness, weakness.  PSYCHIATRY: No anxiety or depression.   DRUG ALLERGIES:  No Known Allergies  VITALS:  Blood pressure 107/79, pulse 76, temperature 97.6 F (36.4 C), temperature source Oral, resp. rate 17, height 5\' 7"  (1.702 m), weight 70.5 kg (155 lb 6.4 oz), SpO2 100 %.  PHYSICAL EXAMINATION:  GENERAL:  55 y.o.-year-old patient lying in the bed with no acute distress.  EYES: Pupils equal, round, reactive to light and accommodation. No scleral icterus. Extraocular muscles intact.  HEENT: Head atraumatic, normocephalic. Oropharynx and nasopharynx clear.  NECK:  Supple, no jugular venous distention. No thyroid enlargement, no tenderness.  LUNGS: Normal breath sounds bilaterally, no wheezing, rales,rhonchi or crepitation. No use of accessory muscles of respiration.  CARDIOVASCULAR: S1, S2 normal. No murmurs, rubs, or gallops.  ABDOMEN: Soft,Midline incision ,Postoperatively distended. Hypoactive bowel sounds present. EXTREMITIES: No pedal edema, cyanosis, or clubbing.  NEUROLOGIC: Cranial  nerves II through XII are intact. Muscle strength 5/5 in all extremities. Sensation intact. Gait not checked.  PSYCHIATRIC: The patient is alert and oriented x 3.  SKIN: No obvious rash, lesion, or ulcer.    LABORATORY PANEL:   CBC  Recent Labs Lab 02/08/16 0442  WBC 9.2  HGB 13.4  HCT 39.0*  PLT 201   ------------------------------------------------------------------------------------------------------------------  Chemistries   Recent Labs Lab 02/08/16 0442  NA 137  K 3.6  CL 103  CO2 27  GLUCOSE 122*  BUN 6  CREATININE 0.74  CALCIUM 8.6*   ------------------------------------------------------------------------------------------------------------------  Cardiac Enzymes  Recent Labs Lab 02/07/16 0217  TROPONINI <0.03   ------------------------------------------------------------------------------------------------------------------  RADIOLOGY:  Dg Chest 1 View  Result Date: 02/07/2016 CLINICAL DATA:  Shortness of breath. EXAM: CHEST 1 VIEW COMPARISON:  CT 02/06/2016.  Chest x-ray 04/26/2014. FINDINGS: Mediastinum and hilar structures are normal. Low lung volumes with mild bibasilar atelectasis. No pleural effusion or pneumothorax. Heart size normal. No free air noted on today's exam. No acute bony abnormality. IMPRESSION: 1. Low lung volumes with mild bibasilar atelectasis. 2.  Exam otherwise unremarkable. Electronically Signed   By: Maisie Fushomas  Register   On: 02/07/2016 06:52    EKG:   Orders placed or performed during the hospital encounter of 02/05/16  . EKG 12-Lead  . EKG 12-Lead  . EKG 12-Lead  . EKG 12-Lead    ASSESSMENT AND PLAN:   Patient is a 55 year old status post abdominal surgery presenting now has chest pain  1. Chest pain appears to be atypical-Mostly related to the GI related EKG shows no ST changes.  troponin levels.Less than 0.033 ruled out acute MI  CTA scan Chest ruled out pulmonary embolism Patient is clinically stable. We'll  sign  off  2. Diabetes type 2 will place him on sliding scale insulin hold medications for now  3. Hypertension continue Norvasc for now.  4. GERD continue Protonix  5.Recurrent partial small bowel obstruction status post surgery POD #3 Laparotomy lysis of adhesions repair of an enterotomy and segmental small bowel resection Postop care per surgery  We will sign off ,feel free to consult as needed  All the records are reviewed and case discussed with Care Management/Social Workerr. Management plans discussed with the patient, wife and they are in agreement.  CODE STATUS: fc  TOTAL TIME TAKING CARE OF THIS PATIENT: 33  minutes.     Note: This dictation was prepared with Dragon dictation along with smaller phrase technology. Any transcriptional errors that result from this process are unintentional.   Ramonita Lab M.D on 02/08/2016 at 3:25 PM  Between 7am to 6pm - Pager - [phone] After 6pm go to [email or web address] - password [email or web address] [facility]  [place] [facility]  Office  [phone]  CC: Primary care physician; Amy Diana Eves, NP

## 2016-02-08 NOTE — Progress Notes (Signed)
He reports moderate abdominal pain. He has been sipping clear liquids. He reports no nausea. He has not yet passed any gas or had bowel movement yet. He is voiding satisfactorily. He is walking in the hallway.  Vital signs stable. He is awake alert and oriented. Lung sounds are clear. Abdomen is mildly distended and tympanitic with minimal tenderness. His midline incision appears to be healing satisfactorily with glue remaining intact.  Plan is to continue IV fluids and clear liquids. Walking hallway. Awaiting bowel function.

## 2016-02-09 LAB — GLUCOSE, CAPILLARY
GLUCOSE-CAPILLARY: 83 mg/dL (ref 65–99)
GLUCOSE-CAPILLARY: 75 mg/dL (ref 65–99)
Glucose-Capillary: 96 mg/dL (ref 65–99)
Glucose-Capillary: 83 mg/dL (ref 65–99)

## 2016-02-09 NOTE — Progress Notes (Signed)
He reports he is tolerating his full liquid diet and having no nausea. He is continued to pass gas per rectum but has not had a bowel movement yet. He has been walking in the hallway. He is voiding satisfactorily.  On examination vital signs are stable. Lung sounds are clear. Abdomen is soft with minimal right midabdominal tenderness. His incision is clean and dry with glue and intact.  Blood sugar is noted  Plan is to discontinue IV and advance to diabetic diet

## 2016-02-10 LAB — GLUCOSE, CAPILLARY
Glucose-Capillary: 83 mg/dL (ref 65–99)
Glucose-Capillary: 98 mg/dL (ref 65–99)

## 2016-02-10 MED ORDER — SENNOSIDES-DOCUSATE SODIUM 8.6-50 MG PO TABS
1.0000 | ORAL_TABLET | Freq: Two times a day (BID) | ORAL | Status: DC
  Administered 2016-02-10: 1 via ORAL
  Filled 2016-02-10: qty 1

## 2016-02-10 NOTE — Discharge Instructions (Addendum)
Take diabetic diet.  Metformin is not needed as blood sugars are normal.  May shower.  Avoid straining and heavy lifting.  Take Tylenol if needed for pain.  Call office to make appointment with Dr Evette CristalSankar in 1-2 weeks.

## 2016-02-10 NOTE — Plan of Care (Signed)
MD wrote discharge order. IV removed. Provided with Education Handout. Discharge paperwork provided, explained, signed and witnessed. No unanswered questions. Discharged via wheelchair by Nursing Staff. Belongings sent with patient and family.

## 2016-02-10 NOTE — Progress Notes (Signed)
He reports he is tolerating his diet satisfactorily. He does continue to pass gas but has not yet had a bowel movement. He reports minimal abdominal discomfort. He is voiding satisfactorily. He is walking in the hallway.  On exam vital signs are stable. He is awake alert and oriented and ambulatory. Abdomen is minimally distended and soft with minimal right abdominal tenderness. His midline incision is healing satisfactorily.  Blood sugars are normal  Plan is to start Senokot 2 times per day. Continue walking in the hallway

## 2016-02-12 ENCOUNTER — Ambulatory Visit (INDEPENDENT_AMBULATORY_CARE_PROVIDER_SITE_OTHER): Payer: BLUE CROSS/BLUE SHIELD | Admitting: General Surgery

## 2016-02-12 ENCOUNTER — Encounter: Payer: Self-pay | Admitting: General Surgery

## 2016-02-12 VITALS — BP 112/62 | HR 78 | Resp 12 | Ht 67.0 in | Wt 140.0 lb

## 2016-02-12 DIAGNOSIS — K56609 Unspecified intestinal obstruction, unspecified as to partial versus complete obstruction: Secondary | ICD-10-CM

## 2016-02-12 NOTE — Patient Instructions (Addendum)
Return in two weeks.  

## 2016-02-12 NOTE — Progress Notes (Signed)
Patient ID: Debby BudJames G Palmeri III, male   DOB: 1960/07/04, 55 y.o.   MRN: [id number]  Chief Complaint  Patient presents with  . Follow-up    HPI Debby BudJames G Beitler III is a 55 y.o. male here today for his follow up regarding segmental small bowel resection and lysis of adhesions performed on 02/05/16. He states he feels much better and is no longer experiencing abdominal pain.   I have reviewed the history of present illness with the patient.  HPI  Past Medical History:  Diagnosis Date  . Diabetes mellitus without complication (HCC)   . History of kidney stones    H/O STONES  . Hypertension     Past Surgical History:  Procedure Laterality Date  . APPENDECTOMY    . CHOLECYSTECTOMY    . LAPAROTOMY N/A 02/05/2016   Procedure: EXPLORATORY LAPAROTOMY FOR SMALL BOWEL OBSTRUCTION;  Surgeon: Kieth BrightlySeeplaputhur G Kyia Rhude, MD;  Location: [facility] ORS;  Service: General;  Laterality: N/A;  . LYSIS OF ADHESION  02/05/2016   Procedure: LYSIS OF ADHESION;  Surgeon: Kieth BrightlySeeplaputhur G Michelle Wnek, MD;  Location: [facility] ORS;  Service: General;;  . SMALL INTESTINE SURGERY  2005    Family History  Problem Relation Age of Onset  . Heart disease Mother   . Hypertension Mother   . Diabetes Sister   . Diabetes Brother     Social History Social History  Substance Use Topics  . Smoking status: Never Smoker  . Smokeless tobacco: Never Used  . Alcohol use No    No Known Allergies  Current Outpatient Prescriptions  Medication Sig Dispense Refill  . amLODipine (NORVASC) 5 MG tablet TAKE 1 TABLET BY MOUTH EVERY DAY (Patient taking differently: TAKE 1 TABLET BY MOUTH EVERY DAY-AM) 30 tablet 11  . glucose blood test strip Use as instructed 100 each 12   No current facility-administered medications for this visit.     Review of Systems Review of Systems  Blood pressure 112/62, pulse 78, resp. rate 12, height 5\' 7"  (1.702 m), weight 140 lb (63.5 kg).  Physical Exam Physical Exam  Constitutional: He is oriented to  person, place, and time. He appears well-developed and well-nourished.  Eyes: Conjunctivae are normal. No scleral icterus.  Neck: Neck supple.  Cardiovascular: Normal rate, regular rhythm and normal heart sounds.   Pulmonary/Chest: Effort normal and breath sounds normal.  Abdominal: Soft. Bowel sounds are normal. He exhibits no mass. There is no tenderness.    Lymphadenopathy:    He has no cervical adenopathy.  Neurological: He is alert and oriented to person, place, and time.  Skin: Skin is warm and dry.    Data Reviewed Notes reviewed   Assessment    Post-op segmental small bowel resection and lysis of adhesions. Doing well.   Plan      Return in two weeks for f/u.  This information has been scribed by Ples SpecterJessica Qualls CMA.  Prentis Langdon G 02/12/2016, 3:15 PM

## 2016-02-13 ENCOUNTER — Encounter: Payer: Self-pay | Admitting: General Surgery

## 2016-02-13 ENCOUNTER — Ambulatory Visit: Payer: BLUE CROSS/BLUE SHIELD | Admitting: General Surgery

## 2016-02-13 ENCOUNTER — Telehealth: Payer: Self-pay

## 2016-02-13 VITALS — BP 110/78 | HR 86 | Temp 96.5°F | Resp 12 | Ht 67.0 in | Wt 140.0 lb

## 2016-02-13 DIAGNOSIS — T814XXA Infection following a procedure, initial encounter: Principal | ICD-10-CM

## 2016-02-13 DIAGNOSIS — T8149XA Infection following a procedure, other surgical site, initial encounter: Secondary | ICD-10-CM | POA: Insufficient documentation

## 2016-02-13 DIAGNOSIS — IMO0001 Reserved for inherently not codable concepts without codable children: Secondary | ICD-10-CM

## 2016-02-13 MED ORDER — SULFAMETHOXAZOLE-TRIMETHOPRIM 800-160 MG PO TABS: 1.0000 | ORAL_TABLET | Freq: Two times a day (BID) | ORAL | 0 refills | Status: DC

## 2016-02-13 NOTE — Progress Notes (Signed)
Patient ID: Scott [name], male   DOB: 1961/02/18, 55 y.o.   MRN: [id number]  Chief Complaint  Patient presents with  . Wound Check    HPI Scott BudJames G Stong [name] is a 55 y.o. male.  Here today for a wound check. He states the top part of the incision started draining last night.The patient had been seen yesterday for his initial postop follow-up. At that time no wound issues were noted.   HPI  Past Medical History:  Diagnosis Date  . Diabetes mellitus without complication (HCC)   . History of kidney stones    H/O STONES  . Hypertension     Past Surgical History:  Procedure Laterality Date  . APPENDECTOMY    . CHOLECYSTECTOMY    . LAPAROTOMY N/A 02/05/2016   Procedure: EXPLORATORY LAPAROTOMY FOR SMALL BOWEL OBSTRUCTION;  Surgeon: Kieth BrightlySeeplaputhur G Sankar, MD;  Location: [facility] ORS;  Service: General;  Laterality: N/A;  . LYSIS OF ADHESION  02/05/2016   Procedure: LYSIS OF ADHESION;  Surgeon: Kieth BrightlySeeplaputhur G Sankar, MD;  Location: [facility] ORS;  Service: General;;  . SMALL INTESTINE SURGERY  2005    Family History  Problem Relation Age of Onset  . Heart disease Mother   . Hypertension Mother   . Diabetes Sister   . Diabetes Brother     Social History Social History  Substance Use Topics  . Smoking status: Never Smoker  . Smokeless tobacco: Never Used  . Alcohol use No    No Known Allergies  Current Outpatient Prescriptions  Medication Sig Dispense Refill  . amLODipine (NORVASC) 5 MG tablet TAKE 1 TABLET BY MOUTH EVERY DAY (Patient taking differently: TAKE 1 TABLET BY MOUTH EVERY DAY-AM) 30 tablet 11  . glucose blood test strip Use as instructed 100 each 12  . sulfamethoxazole-trimethoprim (BACTRIM DS,SEPTRA DS) 800-160 MG tablet Take 1 tablet by mouth 2 (two) times daily. 20 tablet 0   No current facility-administered medications for this visit.     Review of Systems Review of Systems  Constitutional: Negative.   Respiratory: Negative.   Cardiovascular: Negative.      Blood pressure 110/78, pulse 86, temperature (!) 96.5 F (35.8 C), temperature source Oral, resp. rate 12, height 5\' 7"  (1.702 m), weight 140 lb (63.5 kg).  Physical Exam Physical Exam  Constitutional: He is oriented to person, place, and time. He appears well-developed and well-nourished.  Abdominal:    Neurological: He is alert and oriented to person, place, and time.  Skin: Skin is warm and dry.      Assessment    Cellulitis versus abscess status post small bowel resection.    Plan    The patient will continue local heat application. He'll be placed on Bactrim DS one by mouth twice a day.  The patient was encouraged to call if he developed fever, chills, increasing erythema or drainage.     Culture obtained. Follow up with Dr Evette CristalSankar on Monday RX sent  Earline MayotteByrnett, Jeffrey W 02/13/2016, 9:41 PM

## 2016-02-13 NOTE — Telephone Encounter (Signed)
Patient called and states that his incision is leaking. He states that it isn't much. He does report that he is getting drainage from the entire length of his incision. He was asked to come this morning so he can be seen by the nurse. He is amendable to this. He denies any fevers, chills, or increased pain.

## 2016-02-13 NOTE — Patient Instructions (Signed)
The patient is aware to call back for any questions or concerns.  

## 2016-02-14 ENCOUNTER — Encounter: Payer: Self-pay | Admitting: *Deleted

## 2016-02-17 LAB — ANAEROBIC AND AEROBIC CULTURE

## 2016-02-18 ENCOUNTER — Ambulatory Visit (INDEPENDENT_AMBULATORY_CARE_PROVIDER_SITE_OTHER): Payer: BLUE CROSS/BLUE SHIELD | Admitting: General Surgery

## 2016-02-18 ENCOUNTER — Encounter: Payer: Self-pay | Admitting: General Surgery

## 2016-02-18 VITALS — BP 128/68 | HR 72 | Resp 14 | Ht 67.0 in | Wt 140.0 lb

## 2016-02-18 DIAGNOSIS — S301XXS Contusion of abdominal wall, sequela: Secondary | ICD-10-CM

## 2016-02-18 DIAGNOSIS — K56609 Unspecified intestinal obstruction, unspecified as to partial versus complete obstruction: Secondary | ICD-10-CM

## 2016-02-18 NOTE — Progress Notes (Signed)
Patient ID: Scott BudJames G Salsgiver [name], male   DOB: 1961-01-15, 55 y.o.   MRN: [id number]  Chief Complaint  Patient presents with  . Other    HPI Scott [name] is a 55 y.o. male here today for his follow up regarding segmental small bowel resection and lysis of adhesions performed on 02/05/16.Last week he had some serous drainage from upper end of incision. C/S was done and pt placed on Septra. He says no more drainage. No new symptoms HPI  Past Medical History:  Diagnosis Date  . Diabetes mellitus without complication (HCC)   . History of kidney stones    H/O STONES  . Hypertension     Past Surgical History:  Procedure Laterality Date  . APPENDECTOMY    . CHOLECYSTECTOMY    . LAPAROTOMY N/A 02/05/2016   Procedure: EXPLORATORY LAPAROTOMY FOR SMALL BOWEL OBSTRUCTION;  Surgeon: Scott BrightlySeeplaputhur G Yardley Lekas, MD;  Location: [facility];  Service: General;  Laterality: N/A;  . LYSIS OF ADHESION  02/05/2016   Procedure: LYSIS OF ADHESION;  Surgeon: Scott BrightlySeeplaputhur G Keianna Signer, MD;  Location: [facility];  Service: General;;  . SMALL INTESTINE SURGERY  2005    Family History  Problem Relation Age of Onset  . Heart disease Mother   . Hypertension Mother   . Diabetes Sister   . Diabetes Brother     Social History Social History  Substance Use Topics  . Smoking status: Never Smoker  . Smokeless tobacco: Never Used  . Alcohol use No    No Known Allergies  Current Outpatient Prescriptions  Medication Sig Dispense Refill  . amLODipine (NORVASC) 5 MG tablet TAKE 1 TABLET BY MOUTH EVERY DAY (Patient taking differently: TAKE 1 TABLET BY MOUTH EVERY DAY-AM) 30 tablet 11  . glucose blood test strip Use as instructed 100 each 12  . sulfamethoxazole-trimethoprim (BACTRIM DS,SEPTRA DS) 800-160 MG tablet Take 1 tablet by mouth 2 (two) times daily. 20 tablet 0   No current facility-administered medications for this visit.     Review of Systems Review of Systems  Constitutional: Negative.   Respiratory:  Negative.   Cardiovascular: Negative.     Blood pressure 128/68, pulse 72, resp. rate 14, height 5\' 7"  (1.702 m), weight 140 lb (63.5 kg).  Physical Exam Physical Exam  Constitutional: He is oriented to person, place, and time. He appears well-developed.  Abdominal:    Neurological: He is alert and oriented to person, place, and time.  Skin: Skin is warm and dry.    Data Reviewed C/S- enterobacter, sensitive to Septra Assessment    Infected seroma abd incision- small and resolving    Plan    Complete his course of Septra   Return as scheduled next week. This information has been scribed by Ples SpecterJessica Qualls CMA.   Kourtland Coopman G 02/18/2016, 10:28 AM

## 2016-02-18 NOTE — Patient Instructions (Signed)
Return as scheduled 

## 2016-02-26 ENCOUNTER — Telehealth: Payer: Self-pay | Admitting: *Deleted

## 2016-02-26 ENCOUNTER — Ambulatory Visit (INDEPENDENT_AMBULATORY_CARE_PROVIDER_SITE_OTHER): Payer: BLUE CROSS/BLUE SHIELD | Admitting: General Surgery

## 2016-02-26 ENCOUNTER — Ambulatory Visit
Admission: RE | Admit: 2016-02-26 | Discharge: 2016-02-26 | Disposition: A | Discharge status: Home / Self Care | Payer: BLUE CROSS/BLUE SHIELD | Source: Ambulatory Visit | Attending: General Surgery | Admitting: General Surgery

## 2016-02-26 ENCOUNTER — Encounter: Payer: Self-pay | Admitting: General Surgery

## 2016-02-26 VITALS — BP 130/74 | HR 68 | Resp 12 | Ht 67.0 in | Wt 147.0 lb

## 2016-02-26 DIAGNOSIS — K5900 Constipation, unspecified: Secondary | ICD-10-CM | POA: Diagnosis not present

## 2016-02-26 DIAGNOSIS — K56609 Unspecified intestinal obstruction, unspecified as to partial versus complete obstruction: Secondary | ICD-10-CM | POA: Insufficient documentation

## 2016-02-26 NOTE — Progress Notes (Signed)
Patient ID: Scott BudJames G Pesci [name], male   DOB: 11-12-1960, 55 y.o.   MRN: [id number]  Chief Complaint  Patient presents with  . Follow-up    HPI Scott [name] is a 55 y.o. male today for his follow up regarding segmental small bowel resection and lysis of adhesions performedon 02/05/16. Patient states he has not had a bowel movement in one week. States he took some milk of magnesia with no improvement. Denies bloating or pain. Completed the antibiotic given for cellulitis vs abscess of incision.   I have reviewed the history of present illness with the patient.  HPI  Past Medical History:  Diagnosis Date  . Diabetes mellitus without complication (HCC)   . History of kidney stones    H/O STONES  . Hypertension     Past Surgical History:  Procedure Laterality Date  . APPENDECTOMY    . CHOLECYSTECTOMY    . LAPAROTOMY N/A 02/05/2016   Procedure: EXPLORATORY LAPAROTOMY FOR SMALL BOWEL OBSTRUCTION;  Surgeon: Kieth BrightlySeeplaputhur G Jordell Outten, MD;  Location: [facility];  Service: General;  Laterality: N/A;  . LYSIS OF ADHESION  02/05/2016   Procedure: LYSIS OF ADHESION;  Surgeon: Kieth BrightlySeeplaputhur G Maycol Hoying, MD;  Location: [facility];  Service: General;;  . SMALL INTESTINE SURGERY  2005    Family History  Problem Relation Age of Onset  . Heart disease Mother   . Hypertension Mother   . Diabetes Sister   . Diabetes Brother     Social History Social History  Substance Use Topics  . Smoking status: Never Smoker  . Smokeless tobacco: Never Used  . Alcohol use No    No Known Allergies  Current Outpatient Prescriptions  Medication Sig Dispense Refill  . amLODipine (NORVASC) 5 MG tablet TAKE 1 TABLET BY MOUTH EVERY DAY (Patient taking differently: TAKE 1 TABLET BY MOUTH EVERY DAY-AM) 30 tablet 11  . glucose blood test strip Use as instructed 100 each 12  . sulfamethoxazole-trimethoprim (BACTRIM DS,SEPTRA DS) 800-160 MG tablet Take 1 tablet by mouth 2 (two) times daily. 20 tablet 0   No current  facility-administered medications for this visit.     Review of Systems Review of Systems  Blood pressure 130/74, pulse 68, resp. rate 12, height 5\' 7"  (1.702 m), weight 147 lb (66.7 kg).  Physical Exam Physical Exam  Constitutional: He is oriented to person, place, and time. He appears well-developed and well-nourished.  Eyes: Conjunctivae are normal. No scleral icterus.  Cardiovascular: Normal rate, regular rhythm and normal heart sounds.   Abdominal: Soft. Bowel sounds are normal. There is no tenderness.  Incision looks clean-no drainage or signs of infection  Neurological: He is alert and oriented to person, place, and time.  Skin: Skin is warm and dry.    Data Reviewed Previous note  Assessment    Constipation, 3 weeks post small bowel resection.    Plan    Plain film today, flat and upright views. If ok would recommend use of Miralx daily till bowels start working  Return in 3 weeks   This information has been scribed by Ples SpecterJessica Qualls CMA.  Plain films of abdomen shows no obstructive pattern. Moderate amount of stool in colon.  Cobie Leidner G 02/26/2016, 12:36 PM

## 2016-02-26 NOTE — Telephone Encounter (Signed)
Notified patient as instructed, xray shows stool in colon per Dr Evette CristalSankar  miralax daily and fleets enema. Discussed follow-up appointments, patient agrees

## 2016-02-26 NOTE — Patient Instructions (Addendum)
Patient to be scheduled for flat and upright abdominal xray Return in 3 weeks

## 2016-02-28 NOTE — Discharge Summary (Signed)
This is Dr. Radene KneeSakar's patient

## 2016-03-05 ENCOUNTER — Telehealth: Payer: Self-pay | Admitting: Family Medicine

## 2016-03-05 NOTE — Telephone Encounter (Signed)
[facility] and they do not fit pt's current machine.  He asked to have a prescription sent over for a machine to match the strips ordered.  His call back number is [phone]

## 2016-03-05 NOTE — Telephone Encounter (Signed)
Called patient to see what machine he uses. Patient did not know. He was informed to call [facility] and have them e-scribe rx for test strips. Patient verbalizes understanding.

## 2016-03-11 ENCOUNTER — Ambulatory Visit (INDEPENDENT_AMBULATORY_CARE_PROVIDER_SITE_OTHER): Payer: BLUE CROSS/BLUE SHIELD | Admitting: General Surgery

## 2016-03-11 ENCOUNTER — Encounter: Payer: Self-pay | Admitting: General Surgery

## 2016-03-11 VITALS — BP 100/70 | HR 108 | Temp 97.6°F | Resp 14 | Ht 67.0 in | Wt 148.0 lb

## 2016-03-11 DIAGNOSIS — K5900 Constipation, unspecified: Secondary | ICD-10-CM

## 2016-03-11 NOTE — Progress Notes (Signed)
Patient ID: Scott BudJames G Vold [name], male   DOB: 19-Jul-1960, 55 y.o.   MRN: [id number]  Chief Complaint  Patient presents with  . Follow-up    nausea and vomiting    HPI Scott [name] is a 55 y.o. male here today for one episode of nausea and vomiting that began last night. He underwent exploratory laparotomy with lysis of adhesion and small bowel obstruction on 02/05/16.  He used an enema last night with results. However, he states his abdomen is "sore." I have reviewed the history of present illness with the patient.    HPI  Past Medical History:  Diagnosis Date  . Diabetes mellitus without complication (HCC)   . History of kidney stones    H/O STONES  . Hypertension     Past Surgical History:  Procedure Laterality Date  . APPENDECTOMY    . CHOLECYSTECTOMY    . LAPAROTOMY N/A 02/05/2016   Procedure: EXPLORATORY LAPAROTOMY FOR SMALL BOWEL OBSTRUCTION;  Surgeon: Kieth BrightlySeeplaputhur G Sankar, MD;  Location: [facility];  Service: General;  Laterality: N/A;  . LYSIS OF ADHESION  02/05/2016   Procedure: LYSIS OF ADHESION;  Surgeon: Kieth BrightlySeeplaputhur G Sankar, MD;  Location: [facility];  Service: General;;  . SMALL INTESTINE SURGERY  2005    Family History  Problem Relation Age of Onset  . Heart disease Mother   . Hypertension Mother   . Diabetes Sister   . Diabetes Brother     Social History Social History  Substance Use Topics  . Smoking status: Never Smoker  . Smokeless tobacco: Never Used  . Alcohol use No    No Known Allergies  Current Outpatient Prescriptions  Medication Sig Dispense Refill  . amLODipine (NORVASC) 5 MG tablet TAKE 1 TABLET BY MOUTH EVERY DAY (Patient taking differently: TAKE 1 TABLET BY MOUTH EVERY DAY-AM) 30 tablet 11  . glucose blood test strip Use as instructed 100 each 12  . metFORMIN (GLUMETZA) 500 MG (MOD) 24 hr tablet Take 500 mg by mouth daily with breakfast.    . polyethylene glycol powder (MIRALAX) powder Take 1 Container by mouth daily.     No  current facility-administered medications for this visit.     Review of Systems Review of Systems  Constitutional: Negative.   Respiratory: Negative.   Cardiovascular: Negative.   Gastrointestinal: Positive for nausea and vomiting.    Blood pressure 100/70, pulse (!) 108, temperature 97.6 F (36.4 C), temperature source Oral, resp. rate 14, height 5\' 7"  (1.702 m), weight 148 lb (67.1 kg), SpO2 98 %.  Physical Exam Physical Exam  Constitutional: He is oriented to person, place, and time. He appears well-developed and well-nourished.  Abdominal: Soft. Normal appearance and bowel sounds are normal. He exhibits no distension. There is no tenderness.    Neurological: He is alert and oriented to person, place, and time.  Skin: Skin is warm and dry.  Psychiatric: His behavior is normal.    Data Reviewed Progress notes  Assessment    No apparent findings of concern. Pt advised.    Plan    Reassured patient. Follow up as scheduled.     This information has been scribed by Dorathy DaftMarsha Rodgers Likes RN, BSN,BC.   SANKAR,SEEPLAPUTHUR G 03/11/2016, 2:06 PM

## 2016-03-11 NOTE — Patient Instructions (Signed)
The patient is aware to call back for any questions or concerns.  

## 2016-03-14 NOTE — Discharge Summary (Signed)
Physician Discharge Summary  Patient ID: Scott [name] MRN: [id number] DOB/AGE: [birth year] 55 y.o.  Admit date: 02/05/2016 Discharge date: 02/10/2016 Admission Diagnoses: recurrent small bowel obstruction  Discharge Diagnoses:  Active Problems:   Small bowel obstruction   Discharged Condition: good  Hospital Course: This is a 55 year old male who had episodes of recurring small bowel obstruction. He had a history of having had a Meckel's diverticulum resected by segmental small bowel and it appeared based on CT imaging and small bowel follow-through that the location of the partial obstruction was at the anastomotic site. Patient was advised the elective laparotomy for assessment of this and correction and he was agreeable. On 02/05/2016 the patient underwent laparotomy with resection of the prior anastomotic site which appeared to be the side of the obstruction. He also had a fair amount of adhesions which were lysed at that time. In the course of lysis of adhesion and a small enterotomy was made which was repaired. Postoperative course was basically uncomplicated. The patient did experience an episode of chest pain which was evaluated by internal medicine and cardiac sources ruled out. Once the patient had the return of some bowel activity is died was advanced from clear liquids to soft solids. He was then discharged in stable condition on 02/10/2016 to be followed as an outpatient.  Consults: internal medicine  Significant Diagnostic Studies: labs, ekg Treatments: surgery: Laparotomy lysis of adhesions and repair of enterotomy and small bowel resection  Discharge Exam: Blood pressure 106/66, pulse 88, temperature 97.7 F (36.5 C), temperature source Oral, resp. rate 16, height 5\' 7"  (1.702 m), weight 155 lb 6.4 oz (70.5 kg), SpO2 99 %. Incision/Wound: Incisions clean and intact and healing satisfactorily. Abdomen is soft mildly distended but with good bowel sounds. Lungs are  clear  Disposition: 01-Home or Self Care  Discharge Instructions    Call MD for:    Complete by:  As directed    Diet Carb Modified    Complete by:  As directed    Diabetic diet   Discharge instructions    Complete by:  As directed    Ambulate as tolerated.   Increase activity slowly    Complete by:  As directed    No dressing needed    Complete by:  As directed        Medication List    STOP taking these medications   metFORMIN 1000 MG tablet Commonly known as:  GLUCOPHAGE     TAKE these medications   amLODipine 5 MG tablet Commonly known as:  NORVASC TAKE 1 TABLET BY MOUTH EVERY DAY What changed:  See the new instructions.   glucose blood test strip Use as instructed        Signed: SANKAR,SEEPLAPUTHUR G 03/14/2016, 4:21 PM

## 2016-03-20 ENCOUNTER — Ambulatory Visit (INDEPENDENT_AMBULATORY_CARE_PROVIDER_SITE_OTHER): Payer: BLUE CROSS/BLUE SHIELD | Admitting: General Surgery

## 2016-03-20 ENCOUNTER — Encounter: Payer: Self-pay | Admitting: General Surgery

## 2016-03-20 VITALS — BP 122/82 | HR 76 | Resp 12 | Ht 67.0 in | Wt 151.0 lb

## 2016-03-20 DIAGNOSIS — K56609 Unspecified intestinal obstruction, unspecified as to partial versus complete obstruction: Secondary | ICD-10-CM

## 2016-03-20 NOTE — Progress Notes (Signed)
Patient ID: Scott [name], male   DOB: 10/27/1960, 55 y.o.   MRN: [id number]  Chief Complaint  Patient presents with  . Routine Post Op    HPI Scott BudJames G Iman [name] is a 55 y.o. male.  Here today for postoperative visit. He states he is doing well. No GI issues. Bowels moving daily. I have reviewed the history of present illness with the patient.  HPI  Past Medical History:  Diagnosis Date  . Diabetes mellitus without complication (HCC)   . History of kidney stones    H/O STONES  . Hypertension     Past Surgical History:  Procedure Laterality Date  . APPENDECTOMY    . CHOLECYSTECTOMY    . LAPAROTOMY N/A 02/05/2016   Procedure: EXPLORATORY LAPAROTOMY FOR SMALL BOWEL OBSTRUCTION;  Surgeon: Kieth BrightlySeeplaputhur G Sankar, MD;  Location: [facility];  Service: General;  Laterality: N/A;  . LYSIS OF ADHESION  02/05/2016   Procedure: LYSIS OF ADHESION;  Surgeon: Kieth BrightlySeeplaputhur G Sankar, MD;  Location: [facility];  Service: General;;  . SMALL INTESTINE SURGERY  2005    Family History  Problem Relation Age of Onset  . Heart disease Mother   . Hypertension Mother   . Diabetes Sister   . Diabetes Brother     Social History Social History  Substance Use Topics  . Smoking status: Never Smoker  . Smokeless tobacco: Never Used  . Alcohol use No    No Known Allergies  Current Outpatient Prescriptions  Medication Sig Dispense Refill  . amLODipine (NORVASC) 5 MG tablet TAKE 1 TABLET BY MOUTH EVERY DAY (Patient taking differently: TAKE 1 TABLET BY MOUTH EVERY DAY-AM) 30 tablet 11  . glucose blood test strip Use as instructed 100 each 12  . metFORMIN (GLUMETZA) 500 MG (MOD) 24 hr tablet Take 500 mg by mouth daily with breakfast.     No current facility-administered medications for this visit.     Review of Systems Review of Systems  Constitutional: Negative.   Respiratory: Negative.   Cardiovascular: Negative.     Blood pressure 122/82, pulse 76, resp. rate 12, height 5\' 7"  (1.702 m),  weight 151 lb (68.5 kg).  Physical Exam Physical Exam  Constitutional: He is oriented to person, place, and time. He appears well-developed and well-nourished.  HENT:  Mouth/Throat: Oropharynx is clear and moist.  Eyes: Conjunctivae are normal. No scleral icterus.  Abdominal: Soft. Normal appearance and bowel sounds are normal. There is no tenderness. No hernia.  Neurological: He is oriented to person, place, and time.  Skin: Skin is warm and dry.  Psychiatric: His behavior is normal.    Data Reviewed  Prior notes.  Assessment    Stable postop exam. Appears he is much more comfortable now. Constipation has resolved.  he is still in need of screening colonoscopy.  Plan     Colonoscopy with possible biopsy/polypectomy prn: Information regarding the procedure, including its potential risks and complications (including but not limited to perforation of the bowel, which may require emergency surgery to repair, and bleeding) was verbally given to the patient. Educational information regarding lower intestinal endoscopy was given to the patient. Written instructions for how to complete the bowel prep using Miralax were provided. The importance of drinking ample fluids to avoid dehydration as a result of the prep emphasized.  Patient would like to have colonoscopy in April 2018. He will be contacted once schedule is available to arrange a date. Miralax presciption will be sent in once date arranged.  Colonoscopy instructions were provided to the patient today.   The patient is aware to call back for any questions or concerns.      This information has been scribed by Dorathy DaftMarsha Hatch RN, BSN,BC.   SANKAR,SEEPLAPUTHUR G 03/21/2016, 10:42 AM

## 2016-03-20 NOTE — Patient Instructions (Addendum)
The patient is aware to call back for any questions or concerns.  Colonoscopy, Adult A colonoscopy is an exam to look at the entire large intestine. During the exam, a lubricated, bendable tube is inserted into the anus and then passed into the rectum, colon, and other parts of the large intestine. A colonoscopy is often done as a part of normal colorectal screening or in response to certain symptoms, such as anemia, persistent diarrhea, abdominal pain, and blood in the stool. The exam can help screen for and diagnose medical problems, including:  Tumors.  Polyps.  Inflammation.  Areas of bleeding. Tell a health care provider about:  Any allergies you have.  All medicines you are taking, including vitamins, herbs, eye drops, creams, and over-the-counter medicines.  Any problems you or family members have had with anesthetic medicines.  Any blood disorders you have.  Any surgeries you have had.  Any medical conditions you have.  Any problems you have had passing stool. What are the risks? Generally, this is a safe procedure. However, problems may occur, including:  Bleeding.  A tear in the intestine.  A reaction to medicines given during the exam.  Infection (rare). What happens before the procedure? Eating and drinking restrictions  Follow instructions from your health care provider about eating and drinking, which may include:  A few days before the procedure - follow a low-fiber diet. Avoid nuts, seeds, dried fruit, raw fruits, and vegetables.  1-3 days before the procedure - follow a clear liquid diet. Drink only clear liquids, such as clear broth or bouillon, black coffee or tea, clear juice, clear soft drinks or sports drinks, gelatin desert, and popsicles. Avoid any liquids that contain red or purple dye.  On the day of the procedure - do not eat or drink anything during the 2 hours before the procedure, or within the time period that your health care provider  recommends. Bowel prep  If you were prescribed an oral bowel prep to clean out your colon:  Take it as told by your health care provider. Starting the day before your procedure, you will need to drink a large amount of medicated liquid. The liquid will cause you to have multiple loose stools until your stool is almost clear or light green.  If your skin or anus gets irritated from diarrhea, you may use these to relieve the irritation:  Medicated wipes, such as adult wet wipes with aloe and vitamin E.  A skin soothing-product like petroleum jelly.  If you vomit while drinking the bowel prep, take a break for up to 60 minutes and then begin the bowel prep again. If vomiting continues and you cannot take the bowel prep without vomiting, call your health care provider. General instructions  Ask your health care provider about changing or stopping your regular medicines. This is especially important if you are taking diabetes medicines or blood thinners.  Plan to have someone take you home from the hospital or clinic. What happens during the procedure?  An IV tube may be inserted into one of your veins.  You will be given medicine to help you relax (sedative).  To reduce your risk of infection:  Your health care team will wash or sanitize their hands.  Your anal area will be washed with soap.  You will be asked to lie on your side with your knees bent.  Your health care provider will lubricate a long, thin, flexible tube. The tube will have a camera and a light   on the end.  The tube will be inserted into your anus.  The tube will be gently eased through your rectum and colon.  Air will be delivered into your colon to keep it open. You may feel some pressure or cramping.  The camera will be used to take images during the procedure.  A small tissue sample may be removed from your body to be examined under a microscope (biopsy). If any potential problems are found, the tissue will  be sent to a lab for testing.  If small polyps are found, your health care provider may remove them and have them checked for cancer cells.  The tube that was inserted into your anus will be slowly removed. The procedure may vary among health care providers and hospitals. What happens after the procedure?  Your blood pressure, heart rate, breathing rate, and blood oxygen level will be monitored until the medicines you were given have worn off.  Do not drive for 24 hours after the exam.  You may have a small amount of blood in your stool.  You may pass gas and have mild abdominal cramping or bloating due to the air that was used to inflate your colon during the exam.  It is up to you to get the results of your procedure. Ask your health care provider, or the department performing the procedure, when your results will be ready. This information is not intended to replace advice given to you by your health care provider. Make sure you discuss any questions you have with your health care provider. Document Released: 03/28/2000 Document Revised: 10/19/2015 Document Reviewed: 06/12/2015 Elsevier Interactive Patient Education  2017 [employer]  

## 2016-05-21 ENCOUNTER — Telehealth: Payer: Self-pay | Admitting: *Deleted

## 2016-05-21 NOTE — Telephone Encounter (Signed)
Message for patient to call the office.   Patient was in the recalls for a colonoscopy April 2018.  Want to see if patient will go ahead and set up a date for colonoscopy. If so, we also need to arrange for a pre-op visit one to two weeks prior.

## 2016-05-28 ENCOUNTER — Encounter: Payer: Self-pay | Admitting: *Deleted

## 2016-05-28 NOTE — Telephone Encounter (Signed)
Another message left for patient to call the office to arrange colonoscopy for April 2018.

## 2016-05-28 NOTE — Telephone Encounter (Signed)
Letter mailed

## 2016-08-12 ENCOUNTER — Ambulatory Visit
Admission: RE | Admit: 2016-08-12 | Discharge: 2016-08-12 | Disposition: A | Discharge status: Home / Self Care | Payer: BLUE CROSS/BLUE SHIELD | Source: Ambulatory Visit | Attending: General Surgery | Admitting: General Surgery

## 2016-08-12 ENCOUNTER — Encounter: Payer: Self-pay | Admitting: General Surgery

## 2016-08-12 ENCOUNTER — Telehealth: Payer: Self-pay

## 2016-08-12 ENCOUNTER — Ambulatory Visit (INDEPENDENT_AMBULATORY_CARE_PROVIDER_SITE_OTHER): Payer: BLUE CROSS/BLUE SHIELD | Admitting: General Surgery

## 2016-08-12 VITALS — BP 122/80 | HR 108 | Temp 97.1°F | Resp 14 | Ht 67.0 in | Wt 161.0 lb

## 2016-08-12 DIAGNOSIS — R140 Abdominal distension (gaseous): Secondary | ICD-10-CM

## 2016-08-12 DIAGNOSIS — K6389 Other specified diseases of intestine: Secondary | ICD-10-CM | POA: Diagnosis not present

## 2016-08-12 DIAGNOSIS — R1084 Generalized abdominal pain: Secondary | ICD-10-CM | POA: Diagnosis not present

## 2016-08-12 NOTE — Telephone Encounter (Signed)
Patent called and states that he has had constipation and stomach pain for the last week. He vomited a large amount yesterday evening and has had nausea and some dry heaves last night. He reports his last stool was yesterday, very hard and nickel sized. He has been drinking water and using stool softeners. Patient to come in to be seen by Dr Evette Cristal today.

## 2016-08-12 NOTE — Patient Instructions (Signed)
Lab work today. Follow up tomorrow morning.

## 2016-08-12 NOTE — Progress Notes (Signed)
Patient ID: Scott [name] [name], male   DOB: 1960-12-30, 56 y.o.   MRN: [id number]  Chief Complaint  Patient presents with  . Abdominal Pain    HPI Scott [name] is a 56 y.o. male here today for abdomen pain. He states the pain has been going on for about one week. He states last small bowel movement was four days ago, and one small bowel movement yesterday with straining. He vomited a small amount last night.  HPI  Past Medical History:  Diagnosis Date  . Diabetes mellitus without complication (HCC)   . History of kidney stones    H/O STONES  . Hypertension     Past Surgical History:  Procedure Laterality Date  . APPENDECTOMY    . CHOLECYSTECTOMY    . LAPAROTOMY N/A 02/05/2016   Procedure: EXPLORATORY LAPAROTOMY FOR SMALL BOWEL OBSTRUCTION;  Surgeon: Kieth Brightly, MD;  Location: [facility];  Service: General;  Laterality: N/A;  . LYSIS OF ADHESION  02/05/2016   Procedure: LYSIS OF ADHESION;  Surgeon: Kieth Brightly, MD;  Location: [facility];  Service: General;;  . SMALL INTESTINE SURGERY  2005    Family History  Problem Relation Age of Onset  . Heart disease Mother   . Hypertension Mother   . Diabetes Sister   . Diabetes Brother     Social History Social History  Substance Use Topics  . Smoking status: Never Smoker  . Smokeless tobacco: Never Used  . Alcohol use No    No Known Allergies  Current Outpatient Prescriptions  Medication Sig Dispense Refill  . amLODipine (NORVASC) 5 MG tablet TAKE 1 TABLET BY MOUTH EVERY DAY (Patient taking differently: TAKE 1 TABLET BY MOUTH EVERY DAY-AM) 30 tablet 11  . glucose blood test strip Use as instructed 100 each 12   No current facility-administered medications for this visit.     Review of Systems Review of Systems  Constitutional: Negative.   Respiratory: Negative.   Gastrointestinal: Positive for abdominal pain, constipation and vomiting.    Blood pressure 122/80, pulse (!) 108, temperature 97.1  F (36.2 C), temperature source Oral, resp. rate 14, height  (1.702 m), weight 161 lb (73 kg).  Physical Exam Physical Exam  Constitutional: He is oriented to person, place, and time. He appears well-developed and well-nourished.  Eyes: Conjunctivae are normal. No scleral icterus.  Neck: Neck supple.  Cardiovascular: Normal rate and regular rhythm.   Pulmonary/Chest: Effort normal and breath sounds normal.  Abdominal: Soft. Normal appearance. He exhibits distension (mild distension without tympany). Bowel sounds are decreased. There is generalized tenderness (mild, subjective and nonfocal.). No hernia.    Lymphadenopathy:    He has no cervical adenopathy.  Neurological: He is alert and oriented to person, place, and time.  Skin: Skin is warm and dry.  Psychiatric: His behavior is normal.    Data Reviewed Prior notes reviewed Assessment    Symptoms suggestive of recurrent small bowel obstruction, like he has had in the past.     Plan    Abd xray, patient to return of office afterwards for review.    Review of xray showed no bowel distention. Some air fluid levels, and a moderate amount of stool in the colon. Patient advised to try some enemas at home tonight, and to stay on a clear liquid diet. CBC and METB ordered. Patient to return to office tomorrow morning. If symptoms persist, will proceed with abdominal CT scan.   HPI, Physical Exam, Assessment  and Plan have been scribed under the direction and in the presence of Kathreen Cosier, MD  Ples Specter, CMA  I have completed the exam and reviewed the above documentation for accuracy and completeness.  I agree with the above.  Museum/gallery conservator has been used and any errors in dictation or transcription are unintentional.  Aking Klabunde G. Evette Cristal, M.D., F.A.C.S.  Gerlene Burdock G 08/12/2016, 3:02 PM

## 2016-08-13 ENCOUNTER — Ambulatory Visit (INDEPENDENT_AMBULATORY_CARE_PROVIDER_SITE_OTHER): Payer: BLUE CROSS/BLUE SHIELD | Admitting: General Surgery

## 2016-08-13 ENCOUNTER — Encounter: Payer: Self-pay | Admitting: General Surgery

## 2016-08-13 VITALS — BP 140/82 | HR 96 | Resp 14 | Ht 67.0 in | Wt 160.0 lb

## 2016-08-13 DIAGNOSIS — R1084 Generalized abdominal pain: Secondary | ICD-10-CM

## 2016-08-13 DIAGNOSIS — R140 Abdominal distension (gaseous): Secondary | ICD-10-CM

## 2016-08-13 LAB — CBC WITH DIFFERENTIAL/PLATELET
BASOS ABS: 0 10*3/uL (ref 0.0–0.2)
Basos: 0 %
EOS (ABSOLUTE): 0.2 10*3/uL (ref 0.0–0.4)
EOS: 1 %
HEMATOCRIT: 45.5 % (ref 37.5–51.0)
HEMOGLOBIN: 15.7 g/dL (ref 13.0–17.7)
Immature Grans (Abs): 0 10*3/uL (ref 0.0–0.1)
Immature Granulocytes: 0 %
LYMPHS ABS: 2.2 10*3/uL (ref 0.7–3.1)
LYMPHS: 21 %
MCH: 30.3 pg (ref 26.6–33.0)
MCHC: 34.5 g/dL (ref 31.5–35.7)
MCV: 88 fL (ref 79–97)
MONOCYTES: 6 %
Monocytes Absolute: 0.6 10*3/uL (ref 0.1–0.9)
NEUTROS ABS: 7.7 10*3/uL — AB (ref 1.4–7.0)
Neutrophils: 72 %
Platelets: 258 10*3/uL (ref 150–379)
RBC: 5.18 x10E6/uL (ref 4.14–5.80)
RDW: 14.2 % (ref 12.3–15.4)
WBC: 10.7 10*3/uL (ref 3.4–10.8)

## 2016-08-13 LAB — BASIC METABOLIC PANEL
BUN/Creatinine Ratio: 14 (ref 9–20)
BUN: 13 mg/dL (ref 6–24)
CALCIUM: 9.6 mg/dL (ref 8.7–10.2)
CO2: 26 mmol/L (ref 18–29)
Chloride: 96 mmol/L (ref 96–106)
Creatinine, Ser: 0.93 mg/dL (ref 0.76–1.27)
GFR, EST AFRICAN AMERICAN: 108 mL/min/{1.73_m2} (ref >59)
GFR, EST NON AFRICAN AMERICAN: 93 mL/min/{1.73_m2} (ref >59)
Glucose: 163 mg/dL — ABNORMAL HIGH (ref 65–99)
POTASSIUM: 4.6 mmol/L (ref 3.5–5.2)
Sodium: 137 mmol/L (ref 134–144)

## 2016-08-13 NOTE — Patient Instructions (Addendum)
Patient instructed to begin using Miralax every day, drinking plenty of water, and to call if symptoms flare again.  Patient to return in two months to discuss scheduling a colonoscopy.

## 2016-08-13 NOTE — Progress Notes (Signed)
Patient ID: Scott [name] [name], male   DOB: [birth year], 56 y.o.   MRN: [id number]  Chief Complaint  Patient presents with  . Abdominal Pain    HPI Scott [name] is a 56 y.o. male here today for his follow up abdominal pain. He states he feels a lot better than yesterday, denies nausea/vomiting. He was able to move his bowels with enemas last night.  HPI  Past Medical History:  Diagnosis Date  . Diabetes mellitus without complication (HCC)   . History of kidney stones    H/O STONES  . Hypertension     Past Surgical History:  Procedure Laterality Date  . APPENDECTOMY    . CHOLECYSTECTOMY    . LAPAROTOMY N/A 02/05/2016   Procedure: EXPLORATORY LAPAROTOMY FOR SMALL BOWEL OBSTRUCTION;  Surgeon: Kieth Brightly, MD;  Location: [facility];  Service: General;  Laterality: N/A;  . LYSIS OF ADHESION  02/05/2016   Procedure: LYSIS OF ADHESION;  Surgeon: Kieth Brightly, MD;  Location: [facility];  Service: General;;  . SMALL INTESTINE SURGERY  2005    Family History  Problem Relation Age of Onset  . Heart disease Mother   . Hypertension Mother   . Diabetes Sister   . Diabetes Brother     Social History Social History  Substance Use Topics  . Smoking status: Never Smoker  . Smokeless tobacco: Never Used  . Alcohol use No    No Known Allergies  Current Outpatient Prescriptions  Medication Sig Dispense Refill  . amLODipine (NORVASC) 5 MG tablet TAKE 1 TABLET BY MOUTH EVERY DAY (Patient taking differently: TAKE 1 TABLET BY MOUTH EVERY DAY-AM) 30 tablet 11  . glucose blood test strip Use as instructed 100 each 12   No current facility-administered medications for this visit.     Review of Systems Review of Systems  Constitutional: Negative.   Respiratory: Negative.   Cardiovascular: Negative.     Blood pressure 140/82, pulse 96, resp. rate 14, height  (1.702 m), weight 160 lb (72.6 kg).  Physical Exam Physical Exam  Constitutional: He is oriented to  person, place, and time. He appears well-developed and well-nourished.  Cardiovascular: Normal rate, regular rhythm and normal heart sounds.   Pulmonary/Chest: Effort normal and breath sounds normal.  Abdominal: Soft. Bowel sounds are normal. There is no tenderness.  Neurological: He is alert and oriented to person, place, and time.  Skin: Skin is warm and dry.    Data Reviewed Prior notes reviewed  Labs reviewed - CBC and METB normal  Assessment    Abdominal pain, likely due to constipation. Symptoms resolved when patient used enemas to move his bowels last night. Abdomen today is soft, nontender.  Patient's first colonoscopy is due, and he has been reluctant to have one done. Discussed the importance of having a screening colonoscopy, and patient agreed to return in 2 months to discuss and schedule the procedure.     Plan    Patient instructed to begin using Miralax every day, drinking plenty of water, and to call if symptoms flare again.  Patient to return in two months to discuss scheduling a colonoscopy.   HPI, Physical Exam, Assessment and Plan have been scribed under the direction and in the presence of Kathreen Cosier, MD  Ples Specter, CMA       I have completed the exam and reviewed the above documentation for accuracy and completeness.  I agree with the above.  Museum/gallery conservator has been  used and any errors in dictation or transcription are unintentional.  Seeplaputhur G. Evette Cristal, M.D., F.A.C.S.  Gerlene Burdock G 08/13/2016, 9:11 AM

## 2016-10-21 ENCOUNTER — Ambulatory Visit: Payer: BLUE CROSS/BLUE SHIELD | Admitting: General Surgery

## 2017-02-02 ENCOUNTER — Ambulatory Visit: Payer: BLUE CROSS/BLUE SHIELD | Admitting: General Surgery

## 2017-02-04 ENCOUNTER — Encounter: Payer: Self-pay | Admitting: General Surgery

## 2017-02-04 ENCOUNTER — Ambulatory Visit (INDEPENDENT_AMBULATORY_CARE_PROVIDER_SITE_OTHER): Payer: BLUE CROSS/BLUE SHIELD | Admitting: General Surgery

## 2017-02-04 VITALS — BP 130/70 | HR 72 | Resp 12 | Ht 67.0 in | Wt 165.0 lb

## 2017-02-04 DIAGNOSIS — Z1211 Encounter for screening for malignant neoplasm of colon: Secondary | ICD-10-CM

## 2017-02-04 MED ORDER — POLYETHYLENE GLYCOL 3350 17 GM/SCOOP PO POWD: ORAL | 0 refills | Status: DC

## 2017-02-04 NOTE — Patient Instructions (Signed)
Colonoscopy, Adult A colonoscopy is an exam to look at the entire large intestine. During the exam, a lubricated, bendable tube is inserted into the anus and then passed into the rectum, colon, and other parts of the large intestine. A colonoscopy is often done as a part of normal colorectal screening or in response to certain symptoms, such as anemia, persistent diarrhea, abdominal pain, and blood in the stool. The exam can help screen for and diagnose medical problems, including:  Tumors.  Polyps.  Inflammation.  Areas of bleeding.  Tell a health care provider about:  Any allergies you have.  All medicines you are taking, including vitamins, herbs, eye drops, creams, and over-the-counter medicines.  Any problems you or family members have had with anesthetic medicines.  Any blood disorders you have.  Any surgeries you have had.  Any medical conditions you have.  Any problems you have had passing stool. What are the risks? Generally, this is a safe procedure. However, problems may occur, including:  Bleeding.  A tear in the intestine.  A reaction to medicines given during the exam.  Infection (rare).  What happens before the procedure? Eating and drinking restrictions Follow instructions from your health care provider about eating and drinking, which may include:  A few days before the procedure - follow a low-fiber diet. Avoid nuts, seeds, dried fruit, raw fruits, and vegetables.  1-3 days before the procedure - follow a clear liquid diet. Drink only clear liquids, such as clear broth or bouillon, black coffee or tea, clear juice, clear soft drinks or sports drinks, gelatin dessert, and popsicles. Avoid any liquids that contain red or purple dye.  On the day of the procedure - do not eat or drink anything during the 2 hours before the procedure, or within the time period that your health care provider recommends.  Bowel prep If you were prescribed an oral bowel prep  to clean out your colon:  Take it as told by your health care provider. Starting the day before your procedure, you will need to drink a large amount of medicated liquid. The liquid will cause you to have multiple loose stools until your stool is almost clear or light green.  If your skin or anus gets irritated from diarrhea, you may use these to relieve the irritation: ? Medicated wipes, such as adult wet wipes with aloe and vitamin E. ? A skin soothing-product like petroleum jelly.  If you vomit while drinking the bowel prep, take a break for up to 60 minutes and then begin the bowel prep again. If vomiting continues and you cannot take the bowel prep without vomiting, call your health care provider.  General instructions  Ask your health care provider about changing or stopping your regular medicines. This is especially important if you are taking diabetes medicines or blood thinners.  Plan to have someone take you home from the hospital or clinic. What happens during the procedure?  An IV tube may be inserted into one of your veins.  You will be given medicine to help you relax (sedative).  To reduce your risk of infection: ? Your health care team will wash or sanitize their hands. ? Your anal area will be washed with soap.  You will be asked to lie on your side with your knees bent.  Your health care provider will lubricate a long, thin, flexible tube. The tube will have a camera and a light on the end.  The tube will be inserted into your   anus.  The tube will be gently eased through your rectum and colon.  Air will be delivered into your colon to keep it open. You may feel some pressure or cramping.  The camera will be used to take images during the procedure.  A small tissue sample may be removed from your body to be examined under a microscope (biopsy). If any potential problems are found, the tissue will be sent to a lab for testing.  If small polyps are found, your  health care provider may remove them and have them checked for cancer cells.  The tube that was inserted into your anus will be slowly removed. The procedure may vary among health care providers and hospitals. What happens after the procedure?  Your blood pressure, heart rate, breathing rate, and blood oxygen level will be monitored until the medicines you were given have worn off.  Do not drive for 24 hours after the exam.  You may have a small amount of blood in your stool.  You may pass gas and have mild abdominal cramping or bloating due to the air that was used to inflate your colon during the exam.  It is up to you to get the results of your procedure. Ask your health care provider, or the department performing the procedure, when your results will be ready. This information is not intended to replace advice given to you by your health care provider. Make sure you discuss any questions you have with your health care provider. Document Released: 03/28/2000 Document Revised: 01/30/2016 Document Reviewed: 06/12/2015 Elsevier Interactive Patient Education  2018 Elsevier Inc.  

## 2017-02-04 NOTE — Progress Notes (Signed)
Patient ID: Scott [name], male   DOB: May 23, 1960, 56 y.o.   MRN: [id number]  Chief Complaint  Patient presents with  . Colonoscopy    HPI Scott [name] is a 56 y.o. male.  Who presents for a discussion regarding colonoscopy, none prior. Denies any gastrointestinal issues. Bowels move regular, occasional constipation, no bleeding noted. Reports he did an enema today.   HPI  Past Medical History:  Diagnosis Date  . Diabetes mellitus without complication (HCC)   . History of kidney stones    H/O STONES  . Hypertension     Past Surgical History:  Procedure Laterality Date  . APPENDECTOMY    . CHOLECYSTECTOMY    . LAPAROTOMY N/A 02/05/2016   Procedure: EXPLORATORY LAPAROTOMY FOR SMALL BOWEL OBSTRUCTION;  Surgeon: Kieth BrightlySeeplaputhur [name] Sankar, MD;  Location: [facility];  Service: General;  Laterality: N/A;  . LYSIS OF ADHESION  02/05/2016   Procedure: LYSIS OF ADHESION;  Surgeon: Kieth BrightlySeeplaputhur [name] Sankar, MD;  Location: [facility];  Service: General;;  . SMALL INTESTINE SURGERY  2005    Family History  Problem Relation Age of Onset  . Heart disease Mother   . Hypertension Mother   . Diabetes Sister   . Diabetes Brother     Social History Social History  Substance Use Topics  . Smoking status: Never Smoker  . Smokeless tobacco: Never Used  . Alcohol use No    No Known Allergies  Current Outpatient Prescriptions  Medication Sig Dispense Refill  . amLODipine (NORVASC) 5 MG tablet TAKE 1 TABLET BY MOUTH EVERY DAY (Patient taking differently: TAKE 1 TABLET BY MOUTH EVERY DAY-AM) 30 tablet 11  . polyethylene glycol powder (GLYCOLAX/MIRALAX) powder 255 grams one bottle for colonoscopy prep 255 [name] 0   No current facility-administered medications for this visit.     Review of Systems Review of Systems  Constitutional: Negative.   Respiratory: Negative.   Cardiovascular: Negative.   Gastrointestinal: Positive for constipation.    Blood pressure 130/70, pulse 72, resp. rate  12, height 5\' 7"  (1.702 m), weight 165 lb (74.8 kg).  Physical Exam Physical Exam  Constitutional: He is oriented to person, place, and time. He appears well-developed and well-nourished.  HENT:  Mouth/Throat: Oropharynx is clear and moist.  Eyes: Conjunctivae are normal. No scleral icterus.  Neck: Neck supple.  Cardiovascular: Normal rate, regular rhythm and normal heart sounds.   Pulmonary/Chest: Effort normal and breath sounds normal.  Abdominal: Soft. There is no tenderness. Hernia confirmed negative in the right inguinal area and confirmed negative in the left inguinal area.  approx 1cm of suture projecting from healed umbilical incision  Lymphadenopathy:    He has no cervical adenopathy.  Neurological: He is alert and oriented to person, place, and time.  Skin: Skin is warm and dry.  Psychiatric: He has a normal mood and affect. His behavior is normal.    Data Reviewed Prior notes  Assessment    Patient meets criteria for screening colonoscopy. Has never had a colonoscopy before.   Small, approx 1cm length of suture was protruding from patients well healed abdominal midline longitudinal incision. Area was cleaned with alcohol and sterile instruments were used to remove the end of the protruding suture. Pt tolerated this well.     Plan    Colonoscopy with possible biopsy/polypectomy prn: Information regarding the procedure, including its potential risks and complications (including but not limited to perforation of the bowel, which may require emergency surgery to repair, and  bleeding) was verbally given to the patient. Educational information regarding lower intestinal endoscopy was given to the patient. Written instructions for how to complete the bowel prep using Miralax were provided. The importance of drinking ample fluids to avoid dehydration as a result of the prep emphasized.  HPI, Physical Exam, Assessment and Plan have been scribed under the direction and in the  presence of Kathreen Cosier, MD Dorathy Daft, RN      I have completed the exam and reviewed the above documentation for accuracy and completeness.  I agree with the above.  Museum/gallery conservator has been used and any errors in dictation or transcription are unintentional.  [name]. Evette Cristal, M.D., F.A.C.S.  Scott [name] 02/04/2017, 5:42 PM  Patient has been scheduled for a colonoscopy on 03-25-17 at [facility]. Miralax prescription has been sent in to the patient's pharmacy today. Colonoscopy instructions have been reviewed with the patient. This patient is aware to call the office if they have further questions.   Nicholes Mango, CMA

## 2017-03-16 ENCOUNTER — Telehealth: Payer: Self-pay | Admitting: *Deleted

## 2017-03-16 NOTE — Telephone Encounter (Signed)
Patient called the office today wanting to cancel colonoscopy that was scheduled with Dr. Evette CristalSankar for 03[facility]25-[facility].  The patient states that he cannot take any time off of work right now.   Patient was made aware that Dr. Evette CristalSankar is retiring at the end of this year.   Patient states he will contact the office early next year if he wishes to get colonoscopy rescheduled with Dr. Lemar LivingsByrnett.   [facility] has been notified of cancellation.

## 2017-03-25 ENCOUNTER — Ambulatory Visit: Admit: 2017-03-25 | Payer: BLUE CROSS/BLUE SHIELD | Admitting: General Surgery

## 2017-03-25 SURGERY — COLONOSCOPY WITH PROPOFOL
Anesthesia: General

## 2017-05-15 ENCOUNTER — Telehealth: Payer: Self-pay | Admitting: General Surgery

## 2017-05-15 NOTE — Telephone Encounter (Signed)
I CALLED PATIENT AND L/M FOR HIM TO CALL TO LET US KNOW IF HE HAD EVER HAD A COLONOSCOPY IF SO WHEN,WHERE & BY WHOM.HE HAS AN APPOINTMENT  WITH DR Lemar LivingsBYRNETT 05-12-17 FOR DISCUSSION COLONOSCOPY.HE IS AN OLD PATIENT OF DR Evette CristalSANKAR.NO PAST COLONOSCOPIES ARE IN Epic,BUTONE WAS SCHEDULED & CANCELLED IN 12-18. PLEASE LET MELANIE KNOW THIS INFO.

## 2017-05-20 ENCOUNTER — Emergency Department
Admission: EM | Admit: 2017-05-20 | Discharge: 2017-05-20 | Disposition: A | Discharge status: Home / Self Care | Payer: BLUE CROSS/BLUE SHIELD | Attending: Emergency Medicine | Admitting: Emergency Medicine

## 2017-05-20 ENCOUNTER — Other Ambulatory Visit: Payer: Self-pay

## 2017-05-20 DIAGNOSIS — E119 Type 2 diabetes mellitus without complications: Secondary | ICD-10-CM | POA: Insufficient documentation

## 2017-05-20 DIAGNOSIS — X58XXXA Exposure to other specified factors, initial encounter: Secondary | ICD-10-CM | POA: Insufficient documentation

## 2017-05-20 DIAGNOSIS — Z79899 Other long term (current) drug therapy: Secondary | ICD-10-CM | POA: Insufficient documentation

## 2017-05-20 DIAGNOSIS — Y9289 Other specified places as the place of occurrence of the external cause: Secondary | ICD-10-CM | POA: Insufficient documentation

## 2017-05-20 DIAGNOSIS — Y990 Civilian activity done for income or pay: Secondary | ICD-10-CM | POA: Insufficient documentation

## 2017-05-20 DIAGNOSIS — S0993XA Unspecified injury of face, initial encounter: Secondary | ICD-10-CM | POA: Diagnosis not present

## 2017-05-20 DIAGNOSIS — Y939 Activity, unspecified: Secondary | ICD-10-CM | POA: Insufficient documentation

## 2017-05-20 NOTE — ED Notes (Signed)
Patient ambulatory to lobby with steady gait and NAD noted. Verbalized understanding of discharge instructions and follow-up care.  

## 2017-05-20 NOTE — ED Triage Notes (Signed)
Pt was at home and he fell hitting left temporal/orbital region on cardboard. Pt co pain to area, no bruising or deformity noted. States it happened at work and needs to [employer] work[employer].

## 2017-05-20 NOTE — ED Provider Notes (Signed)
[facility] Emergency Department Provider Note  ____________________________________________  Time seen: Approximately 10:07 AM  I have reviewed the triage vital signs and the nursing notes.   HISTORY  Chief Complaint Facial Injury    HPI Scott [name] is a 57 y.o. male that presents emergency department for evaluation of injury to side of face with cardboard while at work today.  Patient states that a piece of cardboard scraped his skin to the left side of his eye. No trauma to eye. No pain. 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 No additional injuries. No headache, visual changes, bleeding, bruising.   Past Medical History:  Diagnosis Date  . Diabetes mellitus without complication (HCC)   . History of kidney stones    H/O STONES  . Hypertension     Patient Active Problem List   Diagnosis Date Noted  . Postoperative cellulitis of surgical wound 02/13/2016  . Small bowel obstruction (HCC) 02/05/2016  . Abdominal pain 10/29/2015  . BP (high blood pressure) 10/29/2015  . Controlled diabetes mellitus type II without complication (HCC) 10/29/2015  . Onychomycosis 10/29/2015  . SBO (small bowel obstruction) (HCC) 09/27/2015    Past Surgical History:  Procedure Laterality Date  . APPENDECTOMY    . CHOLECYSTECTOMY    . LAPAROTOMY N/A 02/05/2016   Procedure: EXPLORATORY LAPAROTOMY FOR SMALL BOWEL OBSTRUCTION;  Surgeon: Kieth BrightlySeeplaputhur G Sankar, MD;  Location: [facility] OR[facility];  Service: General;  Laterality: N/A;  . LYSIS OF ADHESION  02/05/2016   Procedure: LYSIS OF ADHESION;  Surgeon: Kieth BrightlySeeplaputhur G Sankar, MD;  Location: [facility] OR[facility];  Service: General;;  . SMALL INTESTINE SURGERY  2005    Prior to Admission medications   Medication Sig Start Date End Date Taking? Authorizing Provider  amLODipine (NORVASC) 5 MG tablet TAKE 1 TABLET BY MOUTH EVERY DAY Patient taking differently: TAKE 1 TABLET BY MOUTH EVERY DAY-AM 11/01/15   Krebs, Laurel DimmerAmy Lauren, NP  polyethylene glycol  powder (GLYCOLAX/MIRALAX) powder 255 grams one bottle for colonoscopy prep 02/04/17   Kieth BrightlySankar, Seeplaputhur G, MD    Allergies Patient has no known allergies.  Family History  Problem Relation Age of Onset  . Heart disease Mother   . Hypertension Mother   . Diabetes Sister   . Diabetes Brother     Social History Social History   Tobacco Use  . Smoking status: Never Smoker  . Smokeless tobacco: Never Used  Substance Use Topics  . Alcohol use: No    Alcohol/week: 0.0 oz  . Drug use: No     Review of Systems  Cardiovascular: No chest pain. Respiratory: No SOB. Gastrointestinal: No abdominal pain.  No nausea, no vomiting.  Musculoskeletal: Negative for musculoskeletal pain. Skin: Negative for rash, abrasions, lacerations, ecchymosis. Neurological: Negative for headaches   ____________________________________________   PHYSICAL EXAM:  VITAL SIGNS: ED Triage Vitals [05/20/17 0640]  Enc Vitals Group     BP (!) 171/111     Pulse Rate 86     Resp      Temp 97.6 F (36.4 C)     Temp Source Oral     SpO2 99 %     Weight 165 lb (74.8 kg)     Height 5\' 7"  (1.702 m)     Head Circumference      Peak Flow      Pain Score 4     Pain Loc      Pain Edu?      Excl. in GC?  Constitutional: Alert and oriented. Well appearing and in no acute distress. Eyes: Conjunctivae are normal. PERRL. Lazy left eye. No visible swelling, bruising, or abrasion around orbits.  Head: Atraumatic. ENT:      Ears:      Nose: No congestion/rhinnorhea.      Mouth/Throat: Mucous membranes are moist.  Neck: No stridor.   Cardiovascular: Good peripheral circulation. Respiratory: Normal respiratory effort without tachypnea or retractions.  Musculoskeletal: Full range of motion to all extremities. No gross deformities appreciated. Neurologic:  Normal speech and language. No gross focal neurologic deficits are appreciated.  Skin:  Skin is warm, dry and intact. No rash  noted.   ____________________________________________   LABS (all labs ordered are listed, but only abnormal results are displayed)  Labs Reviewed - No data to display ____________________________________________  EKG   ____________________________________________  RADIOLOGY  No results found.  ____________________________________________    PROCEDURES  Procedure(s) performed:    Procedures    Medications - No data to display   ____________________________________________   INITIAL IMPRESSION / ASSESSMENT AND PLAN / ED COURSE  Pertinent labs & imaging results that were available during my care of the patient were reviewed by me and considered in my medical decision making (see chart for details).  Review of the [place] CSRS was performed in accordance of the [facility] prior to dispensing any controlled drugs.   Patient's diagnosis is consistent with facial injury with piece of cardboard. Patient denies any concerns. No bruising, swelling, abrasion noted. Patient is to follow up with PCP as directed. Patient is given ED precautions to return to the ED for any worsening or new symptoms.     ____________________________________________  FINAL CLINICAL IMPRESSION(S) / ED DIAGNOSES  Final diagnoses:  Facial injury, initial encounter      NEW MEDICATIONS STARTED DURING THIS VISIT:  ED Discharge Orders    None          This chart was dictated using voice recognition software/Dragon. Despite best efforts to proofread, errors can occur which can change the meaning. Any change was purely unintentional.    Enid Derry, PA-C 05/20/17 1013    Pershing Proud Myra Rude, MD 05/20/17 1310

## 2017-06-11 ENCOUNTER — Encounter: Payer: Self-pay | Admitting: General Surgery

## 2017-06-11 ENCOUNTER — Ambulatory Visit (INDEPENDENT_AMBULATORY_CARE_PROVIDER_SITE_OTHER): Payer: BLUE CROSS/BLUE SHIELD | Admitting: General Surgery

## 2017-06-11 VITALS — BP 132/86 | HR 103 | Resp 14 | Ht 67.0 in | Wt 158.0 lb

## 2017-06-11 DIAGNOSIS — Z1211 Encounter for screening for malignant neoplasm of colon: Secondary | ICD-10-CM

## 2017-06-11 MED ORDER — POLYETHYLENE GLYCOL 3350 17 GM/SCOOP PO POWD: ORAL | 0 refills | Status: DC

## 2017-06-11 NOTE — Patient Instructions (Signed)
Colonoscopy, Adult A colonoscopy is an exam to look at the entire large intestine. During the exam, a lubricated, bendable tube is inserted into the anus and then passed into the rectum, colon, and other parts of the large intestine. A colonoscopy is often done as a part of normal colorectal screening or in response to certain symptoms, such as anemia, persistent diarrhea, abdominal pain, and blood in the stool. The exam can help screen for and diagnose medical problems, including:  Tumors.  Polyps.  Inflammation.  Areas of bleeding.  Tell a health care provider about:  Any allergies you have.  All medicines you are taking, including vitamins, herbs, eye drops, creams, and over-the-counter medicines.  Any problems you or family members have had with anesthetic medicines.  Any blood disorders you have.  Any surgeries you have had.  Any medical conditions you have.  Any problems you have had passing stool. What are the risks? Generally, this is a safe procedure. However, problems may occur, including:  Bleeding.  A tear in the intestine.  A reaction to medicines given during the exam.  Infection (rare).  What happens before the procedure? Eating and drinking restrictions Follow instructions from your health care provider about eating and drinking, which may include:  A few days before the procedure - follow a low-fiber diet. Avoid nuts, seeds, dried fruit, raw fruits, and vegetables.  1-3 days before the procedure - follow a clear liquid diet. Drink only clear liquids, such as clear broth or bouillon, black coffee or tea, clear juice, clear soft drinks or sports drinks, gelatin dessert, and popsicles. Avoid any liquids that contain red or purple dye.  On the day of the procedure - do not eat or drink anything during the 2 hours before the procedure, or within the time period that your health care provider recommends.  Bowel prep If you were prescribed an oral bowel prep  to clean out your colon:  Take it as told by your health care provider. Starting the day before your procedure, you will need to drink a large amount of medicated liquid. The liquid will cause you to have multiple loose stools until your stool is almost clear or light green.  If your skin or anus gets irritated from diarrhea, you may use these to relieve the irritation: ? Medicated wipes, such as adult wet wipes with aloe and vitamin E. ? A skin soothing-product like petroleum jelly.  If you vomit while drinking the bowel prep, take a break for up to 60 minutes and then begin the bowel prep again. If vomiting continues and you cannot take the bowel prep without vomiting, call your health care provider.  General instructions  Ask your health care provider about changing or stopping your regular medicines. This is especially important if you are taking diabetes medicines or blood thinners.  Plan to have someone take you home from the hospital or clinic. What happens during the procedure?  An IV tube may be inserted into one of your veins.  You will be given medicine to help you relax (sedative).  To reduce your risk of infection: ? Your health care team will wash or sanitize their hands. ? Your anal area will be washed with soap.  You will be asked to lie on your side with your knees bent.  Your health care provider will lubricate a long, thin, flexible tube. The tube will have a camera and a light on the end.  The tube will be inserted into your   anus.  The tube will be gently eased through your rectum and colon.  Air will be delivered into your colon to keep it open. You may feel some pressure or cramping.  The camera will be used to take images during the procedure.  A small tissue sample may be removed from your body to be examined under a microscope (biopsy). If any potential problems are found, the tissue will be sent to a lab for testing.  If small polyps are found, your  health care provider may remove them and have them checked for cancer cells.  The tube that was inserted into your anus will be slowly removed. The procedure may vary among health care providers and hospitals. What happens after the procedure?  Your blood pressure, heart rate, breathing rate, and blood oxygen level will be monitored until the medicines you were given have worn off.  Do not drive for 24 hours after the exam.  You may have a small amount of blood in your stool.  You may pass gas and have mild abdominal cramping or bloating due to the air that was used to inflate your colon during the exam.  It is up to you to get the results of your procedure. Ask your health care provider, or the department performing the procedure, when your results will be ready. This information is not intended to replace advice given to you by your health care provider. Make sure you discuss any questions you have with your health care provider. Document Released: 03/28/2000 Document Revised: 01/30/2016 Document Reviewed: 06/12/2015 Elsevier Interactive Patient Education  2018 Elsevier Inc.  

## 2017-06-11 NOTE — Progress Notes (Signed)
Patient ID: Scott [name], male   DOB: 1960-06-11, 57 y.o.   MRN: [id number]  Chief Complaint  Patient presents with  . Colonoscopy    HPI Scott [name] [name] is a 57 y.o. male.  Who presents for a colonoscopy discussion, none prior. Denies any gastrointestinal issues. Bowels move regular and no bleeding noted. He had to cancel his colonoscopy last time with Dr Scott [name] because his father had passed away with COPD.  HPI  Past Medical History:  Diagnosis Date  . Diabetes mellitus without complication (HCC)   . History of kidney stones    H/O STONES  . Hypertension     Past Surgical History:  Procedure Laterality Date  . APPENDECTOMY    . CHOLECYSTECTOMY    . LAPAROTOMY N/A 02/05/2016   Procedure: EXPLORATORY LAPAROTOMY FOR SMALL BOWEL OBSTRUCTION;  Surgeon: Scott Brightly, MD;  Location: [facility] ORS;  Service: General;  Laterality: N/A;  . LYSIS OF ADHESION  02/05/2016   Procedure: LYSIS OF ADHESION;  Surgeon: Scott Brightly, MD;  Location: [facility] ORS;  Service: General;;  . SMALL INTESTINE SURGERY  2005    Family History  Problem Relation Age of Onset  . Heart disease Mother   . Hypertension Mother   . Diabetes Sister   . Diabetes Brother     Social History Social History   Tobacco Use  . Smoking status: Never Smoker  . Smokeless tobacco: Never Used  Substance Use Topics  . Alcohol use: No    Alcohol/week: 0.0 oz  . Drug use: No    No Known Allergies  Current Outpatient Medications  Medication Sig Dispense Refill  . amLODipine (NORVASC) 5 MG tablet TAKE 1 TABLET BY MOUTH EVERY DAY (Patient taking differently: TAKE 1 TABLET BY MOUTH EVERY DAY-AM) 30 tablet 11  . polyethylene glycol powder (GLYCOLAX/MIRALAX) powder 255 grams one bottle for colonoscopy prep 255 g 0   No current facility-administered medications for this visit.     Review of Systems Review of Systems  Constitutional: Negative.   Respiratory: Negative.   Cardiovascular: Negative.      Blood pressure 132/86, pulse (!) 103, resp. rate 14, height 5\' 7"  (1.702 m), weight 158 lb (71.7 kg), SpO2 98 %.  Physical Exam Physical Exam  Constitutional: He is oriented to person, place, and time. He appears well-developed and well-nourished.  HENT:  Mouth/Throat: Oropharynx is clear and moist.  Eyes: Conjunctivae are normal. No scleral icterus.  Neck: Neck supple.  Cardiovascular: Normal rate, regular rhythm and normal heart sounds.  Pulmonary/Chest: Effort normal and breath sounds normal.  Abdominal: Soft. Bowel sounds are normal. There is no tenderness. No hernia.  Diastasis recti present  Lymphadenopathy:    He has no cervical adenopathy.  Neurological: He is alert and oriented to person, place, and time.  Skin: Skin is warm and dry.  Psychiatric: His behavior is normal.    Data Reviewed Laboratory studies of Aug 12, 2016 showed a hemoglobin of 15.7 with an MCV of 88, white blood cell count of 10,700, platelet count 258,000.  Basic metabolic panel of the same date was unremarkable.  Creatinine 0.9, estimated GFR 93.  Elevated blood sugar 163.  Normal electrolytes.  Assessment    Candidate for screening colonoscopy.    Plan    Colonoscopy with possible biopsy/polypectomy prn: Information regarding the procedure, including its potential risks and complications (including but not limited to perforation of the bowel, which may require emergency surgery to repair, and bleeding) was  verbally given to the patient. Educational information regarding lower intestinal endoscopy was given to the patient. Written instructions for how to complete the bowel prep using Miralax were provided. The importance of drinking ample fluids to avoid dehydration as a result of the prep emphasized.      HPI, Physical Exam, Assessment and Plan have been scribed under the direction and in the presence of Earline MayotteJeffrey W. Nemiah Bubar, MD. Dorathy DaftMarsha Hatch, RN  I have completed the exam and reviewed the above  documentation for accuracy and completeness.  I agree with the above.  Museum[employer]gallery [employer] has been used and any errors in dictation or transcription are unintentional.  Donnalee CurryJeffrey Maylen Waltermire, M.D., F.A.C.S.   Merrily PewJeffrey W Dynasti Kerman 06/12/2017, 7:48 PM  Patient has been scheduled for a colonoscopy on 06[facility]24-17 at [facility]. Miralax prescription has been sent in to the patient's pharmacy today. Colonoscopy instructions have been reviewed with the patient. This patient is aware to call the office if they have further questions.   Nicholes MangoMichele J. Bailey, CMA

## 2017-06-12 ENCOUNTER — Encounter: Payer: Self-pay | Admitting: General Surgery

## 2017-06-12 DIAGNOSIS — Z1211 Encounter for screening for malignant neoplasm of colon: Secondary | ICD-10-CM | POA: Insufficient documentation

## 2017-06-24 ENCOUNTER — Ambulatory Visit
Admission: RE | Admit: 2017-06-24 | Discharge: 2017-06-24 | Disposition: A | Discharge status: Home / Self Care | Payer: BLUE CROSS/BLUE SHIELD | Source: Ambulatory Visit | Attending: General Surgery | Admitting: General Surgery

## 2017-06-24 ENCOUNTER — Encounter: Payer: Self-pay | Admitting: *Deleted

## 2017-06-24 ENCOUNTER — Ambulatory Visit: Payer: BLUE CROSS/BLUE SHIELD | Admitting: Certified Registered Nurse Anesthetist

## 2017-06-24 ENCOUNTER — Encounter
Admission: RE | Disposition: A | Discharge status: Home / Self Care | Payer: Self-pay | Source: Ambulatory Visit | Attending: General Surgery

## 2017-06-24 DIAGNOSIS — E119 Type 2 diabetes mellitus without complications: Secondary | ICD-10-CM | POA: Diagnosis not present

## 2017-06-24 DIAGNOSIS — Z1211 Encounter for screening for malignant neoplasm of colon: Secondary | ICD-10-CM | POA: Diagnosis not present

## 2017-06-24 DIAGNOSIS — Z833 Family history of diabetes mellitus: Secondary | ICD-10-CM | POA: Diagnosis not present

## 2017-06-24 DIAGNOSIS — I10 Essential (primary) hypertension: Secondary | ICD-10-CM | POA: Insufficient documentation

## 2017-06-24 DIAGNOSIS — Z79899 Other long term (current) drug therapy: Secondary | ICD-10-CM | POA: Insufficient documentation

## 2017-06-24 DIAGNOSIS — Z8249 Family history of ischemic heart disease and other diseases of the circulatory system: Secondary | ICD-10-CM | POA: Insufficient documentation

## 2017-06-24 DIAGNOSIS — Z87442 Personal history of urinary calculi: Secondary | ICD-10-CM | POA: Diagnosis not present

## 2017-06-24 DIAGNOSIS — Z9049 Acquired absence of other specified parts of digestive tract: Secondary | ICD-10-CM | POA: Diagnosis not present

## 2017-06-24 HISTORY — PX: COLONOSCOPY WITH PROPOFOL: SHX5780

## 2017-06-24 LAB — GLUCOSE, CAPILLARY: GLUCOSE-CAPILLARY: 168 mg/dL — AB (ref 65–99)

## 2017-06-24 SURGERY — COLONOSCOPY WITH PROPOFOL
Anesthesia: General

## 2017-06-24 MED ORDER — SODIUM CHLORIDE 0.9 % IV SOLN
INTRAVENOUS | Status: DC
  Administered 2017-06-24: 08:00:00 via INTRAVENOUS

## 2017-06-24 MED ORDER — LIDOCAINE HCL (CARDIAC) 20 MG/ML IV SOLN
INTRAVENOUS | Status: DC | PRN
  Administered 2017-06-24: 50 mg via INTRAVENOUS

## 2017-06-24 MED ORDER — PROPOFOL 500 MG/50ML IV EMUL
INTRAVENOUS | Status: AC
  Filled 2017-06-24: qty 50

## 2017-06-24 MED ORDER — PROPOFOL 10 MG/ML IV BOLUS
INTRAVENOUS | Status: DC | PRN
  Administered 2017-06-24: 80 mg via INTRAVENOUS
  Administered 2017-06-24: 30 mg via INTRAVENOUS

## 2017-06-24 MED ORDER — LIDOCAINE HCL (PF) 2 % IJ SOLN
INTRAMUSCULAR | Status: AC
  Filled 2017-06-24: qty 10

## 2017-06-24 MED ORDER — PROPOFOL 500 MG/50ML IV EMUL
INTRAVENOUS | Status: DC | PRN
  Administered 2017-06-24: 150 ug/kg/min via INTRAVENOUS

## 2017-06-24 NOTE — Anesthesia Preprocedure Evaluation (Addendum)
Anesthesia Evaluation  Patient identified by MRN, date of birth, ID band Patient awake    Reviewed: Allergy & Precautions, H&P , NPO status , Patient's Chart, lab work & pertinent test results  History of Anesthesia Complications Negative for: history of anesthetic complications  Airway Mallampati: III  TM Distance: <3 FB Neck ROM: limited    Dental  (+) Chipped, Poor Dentition, Missing   Pulmonary neg pulmonary ROS, neg shortness of breath,           Cardiovascular Exercise Tolerance: Good hypertension, (-) angina(-) Past MI and (-) DOE      Neuro/Psych negative neurological ROS  negative psych ROS   GI/Hepatic negative GI ROS, Neg liver ROS, neg GERD  ,  Endo/Other  diabetes, Type 2  Renal/GU negative Renal ROS  negative genitourinary   Musculoskeletal   Abdominal   Peds  Hematology negative hematology ROS (+)   Anesthesia Other Findings Past Medical History: No date: Diabetes mellitus without complication (HCC)     Comment:  pt. stated "I don't have diabetes", says last time he               was in hospital the dx changed No date: History of kidney stones     Comment:  H/O STONES No date: Hypertension  Past Surgical History: No date: APPENDECTOMY No date: CHOLECYSTECTOMY 02/05/2016: LAPAROTOMY; N/A     Comment:  Procedure: EXPLORATORY LAPAROTOMY FOR SMALL BOWEL               OBSTRUCTION;  Surgeon: Kieth BrightlySeeplaputhur G Sankar, MD;                Location: [facility] ORS;  Service: General;  Laterality: N/A; 02/05/2016: LYSIS OF ADHESION     Comment:  Procedure: LYSIS OF ADHESION;  Surgeon: Kieth BrightlySeeplaputhur G               Sankar, MD;  Location: [facility] ORS;  Service: General;; 2005: SMALL INTESTINE SURGERY     Comment:  Excision of Meckel's diverticulum  BMI    Body Mass Index:  24.75 kg/m      Reproductive/Obstetrics negative OB ROS                             Anesthesia  Physical Anesthesia Plan  ASA: II  Anesthesia Plan: General   Post-op Pain Management:    Induction: Intravenous  PONV Risk Score and Plan: Propofol infusion and TIVA  Airway Management Planned: Natural Airway and Nasal Cannula  Additional Equipment:   Intra-op Plan:   Post-operative Plan:   Informed Consent: I have reviewed the patients History and Physical, chart, labs and discussed the procedure including the risks, benefits and alternatives for the proposed anesthesia with the patient or authorized representative who has indicated his/her understanding and acceptance.   Dental Advisory Given  Plan Discussed with: Anesthesiologist, CRNA and Surgeon  Anesthesia Plan Comments: (Patient consented for risks of anesthesia including but not limited to:  - adverse reactions to medications - risk of intubation if required - damage to teeth, lips or other oral mucosa - sore throat or hoarseness - Damage to heart, brain, lungs or loss of life  Patient voiced understanding.)        Anesthesia Quick Evaluation

## 2017-06-24 NOTE — Transfer of Care (Signed)
Immediate Anesthesia Transfer of Care Note  Patient: Scott [name]  Procedure(s) Performed: COLONOSCOPY WITH PROPOFOL (N/A )  Patient Location: PACU and Endoscopy Unit  Anesthesia Type:General  Level of Consciousness: sedated  Airway & Oxygen Therapy: Patient Spontanous Breathing  Post-op Assessment: Report given to RN  Post vital signs: stable  Last Vitals:  Vitals:   06/24/17 0711 06/24/17 0808  BP: (!) 176/106   Pulse: 85   Resp: 16 (P) 16  Temp: (!) 35.1 C (!) (P) 36.1 C  SpO2: 100% (P) 98%    Last Pain:  Vitals:   06/24/17 0808  TempSrc: (P) Tympanic         Complications: No apparent anesthesia complications

## 2017-06-24 NOTE — Anesthesia Post-op Follow-up Note (Signed)
Anesthesia QCDR form completed.        

## 2017-06-24 NOTE — H&P (Signed)
No change in clinical condition or exam. Tolerated prep well.  For screening colonoscopy.

## 2017-06-24 NOTE — Progress Notes (Signed)
Pt. Scott [name] that he doesn't have diabetes. Pre procedure blood sugar 168. Patient advised of result and suggest that he checks with his medical provider and he agrees.

## 2017-06-24 NOTE — Op Note (Signed)
[facility] Patient Name: Scott NighJames Denman Procedure Date: 06/24/2017 7:33 AM MRN: [id number] Account #: [id number] Date of Birth: 05-03-1960 Admit Type: Outpatient Age: [age] Room: [facility] ENDO ROOM 1 Gender: Male Note Status: Finalized Procedure:            Colonoscopy Indications:          Screening for colorectal malignant neoplasm Providers:            Earline MayotteJeffrey W. Chaynce Schafer, MD Referring MD:         Oneal DeputyEdward L. Juanetta GoslingHawkins MD, MD (Referring MD) Medicines:            Monitored Anesthesia Care Complications:        No immediate complications. Procedure:            Pre-Anesthesia Assessment:                       - Prior to the procedure, a History and Physical was                        performed, and patient medications, allergies and                        sensitivities were reviewed. The patient's tolerance of                        previous anesthesia was reviewed.                       - The risks and benefits of the procedure and the                        sedation options and risks were discussed with the                        patient. All questions were answered and informed                        consent was obtained.                       After obtaining informed consent, the colonoscope was                        passed under direct vision. Throughout the procedure,                        the patient's blood pressure, pulse, and oxygen                        saturations were monitored continuously. The                        Colonoscope was introduced through the anus and                        advanced to the the cecum, identified by appendiceal                        orifice and ileocecal valve. The colonoscopy was  performed without difficulty. The patient tolerated the                        procedure well. The quality of the bowel preparation                        was excellent. Findings:      The entire examined colon  appeared normal on direct and retroflexion       views. Impression:           - The entire examined colon is normal on direct and                        retroflexion views.                       - No specimens collected. Recommendation:       - Repeat colonoscopy in 10 years for screening purposes. Procedure Code(s):    --- Professional ---                       [phone], Colonoscopy, flexible; diagnostic, including                        collection of specimen(s) by brushing or washing, when                        performed (separate procedure) Diagnosis Code(s):    --- Professional ---                       Z12.11, Encounter for screening for malignant neoplasm                        of colon CPT copyright 2016 American Medical Association. All rights reserved. The codes documented in this report are preliminary and upon coder review may  be revised to meet current compliance requirements. Earline Mayotte, MD 06/24/2017 8:04:57 AM This report has been signed electronically. Number of Addenda: 0 Note Initiated On: 06/24/2017 7:33 AM Scope Withdrawal Time: 0 hours 12 minutes 44 seconds  Total Procedure Duration: 0 hours 20 minutes 46 seconds       [facility]

## 2017-06-24 NOTE — Anesthesia Postprocedure Evaluation (Signed)
Anesthesia Post Note  Patient: Scott [name]  Procedure(s) Performed: COLONOSCOPY WITH PROPOFOL (N/A )  Patient location during evaluation: Endoscopy Anesthesia Type: General Level of consciousness: awake and alert Pain management: pain level controlled Vital Signs Assessment: post-procedure vital signs reviewed and stable Respiratory status: spontaneous breathing, nonlabored ventilation, respiratory function stable and patient connected to nasal cannula oxygen Cardiovascular status: blood pressure returned to baseline and stable Postop Assessment: no apparent nausea or vomiting Anesthetic complications: no     Last Vitals:  Vitals:   06/24/17 0808 06/24/17 0838  BP:  140/90  Pulse:    Resp: 16 16  Temp: (!) 36.1 C   SpO2: 98%     Last Pain:  Vitals:   06/24/17 0808  TempSrc: Tympanic                 Cleda MccreedyJoseph K Byrl Latin

## 2017-06-25 ENCOUNTER — Encounter: Payer: Self-pay | Admitting: General Surgery

## 2017-08-25 ENCOUNTER — Encounter: Payer: Self-pay | Admitting: Nurse Practitioner

## 2017-08-25 ENCOUNTER — Other Ambulatory Visit: Payer: Self-pay

## 2017-08-25 ENCOUNTER — Ambulatory Visit (INDEPENDENT_AMBULATORY_CARE_PROVIDER_SITE_OTHER): Payer: BLUE CROSS/BLUE SHIELD | Admitting: Nurse Practitioner

## 2017-08-25 VITALS — BP 157/103 | HR 90 | Temp 97.8°F | Ht 67.0 in | Wt 162.2 lb

## 2017-08-25 DIAGNOSIS — I10 Essential (primary) hypertension: Secondary | ICD-10-CM

## 2017-08-25 DIAGNOSIS — E119 Type 2 diabetes mellitus without complications: Secondary | ICD-10-CM

## 2017-08-25 LAB — POCT GLYCOSYLATED HEMOGLOBIN (HGB A1C): Hemoglobin A1C: 7.7

## 2017-08-25 MED ORDER — LISINOPRIL 10 MG PO TABS: 10.0000 mg | ORAL_TABLET | Freq: Every day | ORAL | 1 refills | Status: DC

## 2017-08-25 MED ORDER — METFORMIN HCL 500 MG PO TABS
500.0000 mg | ORAL_TABLET | Freq: Two times a day (BID) | ORAL | 3 refills | Status: DC
Start: 2017-08-25 — End: 2018-01-08

## 2017-08-25 NOTE — Patient Instructions (Addendum)
Scott [name],   Thank you for coming in to clinic today.  1. Restart metformin.  Take 500 mg twice daily with meals.  For the first week, take only one dose of metformin per day.  After 1 week, take one tablet twice daily.  2. START lisinopril 10 mg once daily.   Some of the possible side effects are:  - angioedema: swelling of lips, mouth, and tongue.  If this rare side effect occurs, please go to ED. - cough: you could develop a dry, hacking cough caused by this medicine.  If it occurs, it will go away after stopping this medicine.  Call the clinic before stopping the medication. - kidney damage: we will monitor your labs when we start this medicine and at least one time per year.  If you do not have an change in kidney function when starting this medicine, it will provide kidney protection over time.   You will be due for FASTING BLOOD WORK.  This means you should eat no food or drink after breakfast for afternoon collection.  Drink only water or coffee without cream/sugar on the morning of your lab visit. - Please go ahead and have your labs drawn at [facility] for lab draw 2 weeks. - Your results will be available about 2-3 days after blood draw.  If you have set up a MyChart account, you can can log in to MyChart online to view your results and a brief explanation. Also, we can discuss your results together at your next office visit if you would like.   Please schedule a follow-up appointment with Wilhelmina Mcardle, AGNP. Return in about 6 weeks (around 10/06/2017) for hypertension.  If you have any other questions or concerns, please feel free to call the clinic or send a message through MyChart. You may also schedule an earlier appointment if necessary.  You will receive a survey after today's visit either digitally by e-mail or paper by [employer]. Your experiences and feedback matter to [place].  Please respond so we know how we are doing as we provide care for you.   Wilhelmina Mcardle, DNP,  AGNP-BC Adult Gerontology Nurse Practitioner [facility], [facility]

## 2017-08-25 NOTE — Progress Notes (Signed)
Subjective:    Patient ID: Scott [name], male    DOB: December 18, 1960, 57 y.o.   MRN: [id number]  Scott [name] is a 57 y.o. male presenting on 08/25/2017 for Diabetes and Hypertension   HPI Diabetes Pt presents today for follow up of Type 2 diabetes mellitus. He is not checking CBG at home - Current diabetic medications include: metformin - He is not currently symptomatic.  - He denies polyphagia, polyuria, headaches, diaphoresis, shakiness, chills, pain, numbness or tingling in extremities and changes in vision.   - Clinical course has been unchanged. - He  reports no regular exercise routine. - His diet is moderate in salt, moderate in fat, and moderate in carbohydrates. - Weight trend: stable  PREVENTION: Eye exam current (within one year): unknown - [facility] last record - Pt reporrts normal retina exam Foot exam current (within one year): no  Lipid/ASCVD risk reduction - on statin: no Kidney protection - on ace or arb: yes Recent Labs    08/25/17 1610  HGBA1C 7.7    Hypertension - He is not checking BP at home or outside of clinic.    - Current medications: lisinopril 10 mg once daily, tolerating well without side effects - He is not currently symptomatic. - Pt denies headache, lightheadedness, dizziness, changes in vision, chest tightness/pressure, palpitations, leg swelling, sudden loss of speech or loss of consciousness.  Social History   Tobacco Use  . Smoking status: Never Smoker  . Smokeless tobacco: Never Used  Substance Use Topics  . Alcohol use: No    Alcohol/week: 0.0 oz  . Drug use: No    Review of Systems Per HPI unless specifically indicated above     Objective:    BP (!) 157/103 (BP Location: Right Arm, Patient Position: Sitting, Cuff Size: Normal)   Pulse 90   Temp 97.8 F (36.6 C) (Oral)   Ht  (1.702 m)   Wt 162 lb 3.2 oz (73.6 kg)   BMI 25.40 kg/m   Wt Readings from Last 3 Encounters:  08/25/17 162 lb 3.2 oz  (73.6 kg)  06/24/17 158 lb (71.7 kg)  06/11/17 158 lb (71.7 kg)    Physical Exam  Constitutional: He is oriented to person, place, and time. He appears well-developed and well-nourished. No distress.  HENT:  Head: Normocephalic and atraumatic.  Neck: Normal range of motion. Neck supple. Carotid bruit is not present.  Cardiovascular: Normal rate, regular rhythm, S1 normal, S2 normal, normal heart sounds and intact distal pulses.  Pulmonary/Chest: Effort normal and breath sounds normal. No respiratory distress.  Abdominal: Soft. Bowel sounds are normal. He exhibits no distension. There is no hepatosplenomegaly. There is no tenderness. No hernia.  Musculoskeletal: He exhibits no edema (pedal).  Neurological: He is alert and oriented to person, place, and time.  Skin: Skin is warm and dry. Capillary refill takes less than 2 seconds.  Psychiatric: He has a normal mood and affect. His behavior is normal. Judgment and thought content normal.  Vitals reviewed.   Results for orders placed or performed during the hospital encounter of 06/24/17  Glucose, capillary  Result Value Ref Range   Glucose-Capillary 168 (H) 65 - 99 mg/dL      Assessment & Plan:   Problem List Items Addressed This Visit      Cardiovascular and Mediastinum   Hypertension Uncontrolled hypertension.  BP goal < 130/80.  Pt is not currently working on lifestyle modifications.  Taking medications tolerating well  without side effects. No current complications.  Plan: 1. Start lisinopril 10 mg once daily 2. Obtain labs   3. Encouraged heart healthy diet and increasing exercise to 30 minutes most days of the week. 4. Check BP 1-2 x per week at home, keep log, and bring to clinic at next appointment. 5. Follow up 6 weeks.     Relevant Medications   lisinopril (PRINIVIL,ZESTRIL) 10 MG tablet   Other Relevant Orders   Comprehensive Metabolic Panel (CMET)   Lipid panel     Endocrine   Controlled diabetes mellitus type II  without complication (HCC) - Primary ControlledDM with A1c 7.7%, but not at goal of A1c < 7.0%. - No known complications or hypoglycemia.  Plan:  1. Restart metformin 2. Encourage improved lifestyle: - low carb/low glycemic diet handout provided - Increase physical activity to 30 minutes most days of the week.  Explained that increased physical activity increases body's use of sugar for energy. 3. Check fasting am CBG and bring log to next visit for review 4. Continue ACEi 5. Advised to schedule DM ophtho exam, send record. 6. Follow-up 6 weeks    Relevant Medications   lisinopril (PRINIVIL,ZESTRIL) 10 MG tablet   metFORMIN (GLUCOPHAGE) 500 MG tablet   Other Relevant Orders   POCT glycosylated hemoglobin (Hb A1C) (Completed)   Comprehensive Metabolic Panel (CMET)   Lipid panel      Meds ordered this encounter  Medications  . lisinopril (PRINIVIL,ZESTRIL) 10 MG tablet    Sig: Take 1 tablet (10 mg total) by mouth daily.    Dispense:  90 tablet    Refill:  1    Order Specific Question:   Supervising Provider    Answer:   Smitty Cords [[id number]]  . metFORMIN (GLUCOPHAGE) 500 MG tablet    Sig: Take 1 tablet (500 mg total) by mouth 2 (two) times daily with a meal.    Dispense:  180 tablet    Refill:  3    Order Specific Question:   Supervising Provider    Answer:   Smitty Cords [[id number]]    Follow up plan: Return in about 6 weeks (around 10/06/2017) for hypertension.  Wilhelmina Mcardle, DNP, AGPCNP-BC Adult Gerontology Primary Care Nurse Practitioner [facility] 08/25/2017, 4:07 PM

## 2017-09-08 ENCOUNTER — Telehealth: Payer: Self-pay | Admitting: Nurse Practitioner

## 2017-09-08 NOTE — Telephone Encounter (Signed)
Pt has reduced back to metformin 500 mg one tablet daily with resolution of cramping.  Continues to take lisinopril.  Pt verbalized understanding about labs and will have collected next Tuesday.

## 2017-09-08 NOTE — Telephone Encounter (Signed)
Attempted to call pt.  No answer.  Left message to return call.   Lisinopril is only new medication not previously taken.  He was requested to return to clinic for labs.  I recommend he follow through with this to evaluate potassium levels, which can change with lisinopril.  High potassium is a common cause of leg cramps.   Metformin is highly unlikely to cause leg cramping, but is possible.  We will need to discuss other treatment of his diabetes if we reduce this dose.

## 2017-09-08 NOTE — Telephone Encounter (Signed)
Pt  Called states the he stop taken 2 pills for metformin because his legs was cramping.He was only taken 1 pill for metformin. Pt  Call back  # is [phone]

## 2017-10-05 ENCOUNTER — Other Ambulatory Visit: Payer: Self-pay | Admitting: Nurse Practitioner

## 2017-10-05 ENCOUNTER — Other Ambulatory Visit: Payer: BLUE CROSS/BLUE SHIELD

## 2017-10-05 DIAGNOSIS — E119 Type 2 diabetes mellitus without complications: Secondary | ICD-10-CM | POA: Diagnosis not present

## 2017-10-05 DIAGNOSIS — I10 Essential (primary) hypertension: Secondary | ICD-10-CM | POA: Diagnosis not present

## 2017-10-06 ENCOUNTER — Encounter: Payer: Self-pay | Admitting: Nurse Practitioner

## 2017-10-06 ENCOUNTER — Other Ambulatory Visit: Payer: Self-pay

## 2017-10-06 ENCOUNTER — Ambulatory Visit (INDEPENDENT_AMBULATORY_CARE_PROVIDER_SITE_OTHER): Payer: BLUE CROSS/BLUE SHIELD | Admitting: Nurse Practitioner

## 2017-10-06 VITALS — BP 115/67 | Temp 97.7°F | Ht 67.0 in | Wt 154.6 lb

## 2017-10-06 DIAGNOSIS — I10 Essential (primary) hypertension: Secondary | ICD-10-CM

## 2017-10-06 DIAGNOSIS — E119 Type 2 diabetes mellitus without complications: Secondary | ICD-10-CM

## 2017-10-06 LAB — COMPREHENSIVE METABOLIC PANEL
AG Ratio: 1.6 (calc) (ref 1.0–2.5)
ALT: 22 U/L (ref 9–46)
AST: 17 U/L (ref 10–35)
Albumin: 4.4 g/dL (ref 3.6–5.1)
Alkaline phosphatase (APISO): 47 U/L (ref 40–115)
BUN: 11 mg/dL (ref 7–25)
CO2: 32 mmol/L (ref 20–32)
Calcium: 10 mg/dL (ref 8.6–10.3)
Chloride: 102 mmol/L (ref 98–110)
Creat: 0.88 mg/dL (ref 0.70–1.33)
Globulin: 2.8 g/dL (calc) (ref 1.9–3.7)
Glucose, Bld: 135 mg/dL — ABNORMAL HIGH (ref 65–99)
Potassium: 4.9 mmol/L (ref 3.5–5.3)
Sodium: 138 mmol/L (ref 135–146)
Total Bilirubin: 0.5 mg/dL (ref 0.2–1.2)
Total Protein: 7.2 g/dL (ref 6.1–8.1)

## 2017-10-06 LAB — LIPID PANEL
Cholesterol: 145 mg/dL (ref <200)
HDL: 44 mg/dL (ref >40)
LDL Cholesterol (Calc): 78 mg/dL (calc)
Non-HDL Cholesterol (Calc): 101 mg/dL (calc) (ref <130)
Total CHOL/HDL Ratio: 3.3 (calc) (ref <5.0)
Triglycerides: 136 mg/dL (ref <150)

## 2017-10-06 MED ORDER — ATORVASTATIN CALCIUM 20 MG PO TABS: 20.0000 mg | ORAL_TABLET | Freq: Every day | ORAL | 5 refills | Status: DC

## 2017-10-06 NOTE — Progress Notes (Signed)
Subjective:    Patient ID: Scott [name], male    DOB: 09-Aug-1960, 57 y.o.   MRN: [id number]  Scott [name] is a 57 y.o. male presenting on 10/06/2017 for Hypertension   HPI Hypertension - He is not checking BP at home or outside of clinic.    - Current medications: lisinopril 10 mg once daily, tolerating well without side effects - He is not currently symptomatic. - Pt denies headache, lightheadedness, dizziness, changes in vision, chest tightness/pressure, palpitations, leg swelling, sudden loss of speech or loss of consciousness. - He  reports no regular exercise routine.  Works at [employer] 10 hours / day 5-6 days per week. - His diet is moderate in salt, moderate in fat, and moderate in carbohydrates.   Diabetes Pt presents today for follow up of Type 2 diabetes mellitus. He is not checking CBG at home.  He currently does not have strips. - Current diabetic medications include: metformin 500 mg once daily - had cramping with higher dose, so reduced between visits - He is not currently symptomatic.  - He denies polydipsia, polyphagia, polyuria, headaches, diaphoresis, shakiness, chills, pain, numbness or tingling in extremities and changes in vision.   - Clinical course has been stable. - Weight trend: decreasing steadily  PREVENTION: Eye exam current (within one year): no Foot exam current (within one year): no  Lipid/ASCVD risk reduction - on statin: no - declines since cholesterol is normal Kidney protection - on ace or arb: yes Recent Labs    08/25/17 1610  HGBA1C 7.7    Social History   Tobacco Use  . Smoking status: Never Smoker  . Smokeless tobacco: Never Used  Substance Use Topics  . Alcohol use: No    Alcohol/week: 0.0 oz  . Drug use: No    Review of Systems Per HPI unless specifically indicated above     Objective:    BP 115/67 (BP Location: Right Arm, Patient Position: Sitting, Cuff Size: Normal)   Temp 97.7 F (36.5 C) (Oral)   Ht 5\' 7"   (1.702 m)   Wt 154 lb 9.6 oz (70.1 kg)   BMI 24.21 kg/m   Wt Readings from Last 3 Encounters:  10/06/17 154 lb 9.6 oz (70.1 kg)  08/25/17 162 lb 3.2 oz (73.6 kg)  06/24/17 158 lb (71.7 kg)    Physical Exam  Constitutional: He is oriented to person, place, and time. He appears well-developed and well-nourished. No distress.  HENT:  Head: Normocephalic and atraumatic.  Neck: Normal range of motion. Neck supple.  Cardiovascular: Normal rate, regular rhythm, S1 normal, S2 normal, normal heart sounds and intact distal pulses.  Pulmonary/Chest: Effort normal and breath sounds normal. No respiratory distress.  Neurological: He is alert and oriented to person, place, and time.  Skin: Skin is warm and dry. Capillary refill takes less than 2 seconds.  Psychiatric: He has a normal mood and affect. His behavior is normal. Judgment and thought content normal.  Vitals reviewed.   Results for orders placed or performed in visit on 10/05/17  Comprehensive Metabolic Panel (CMET)  Result Value Ref Range   Glucose, Bld 135 (H) 65 - 99 mg/dL   BUN 11 7 - 25 mg/dL   Creat 7.82 9.56 - 2.13 mg/dL   BUN/Creatinine Ratio NOT APPLICABLE 6 - 22 (calc)   Sodium 138 135 - 146 mmol/L   Potassium 4.9 3.5 - 5.3 mmol/L   Chloride 102 98 - 110 mmol/L   CO2 32 20 -  32 mmol/L   Calcium 10.0 8.6 - 10.3 mg/dL   Total Protein 7.2 6.1 - 8.1 g/dL   Albumin 4.4 3.6 - 5.1 g/dL   Globulin 2.8 1.9 - 3.7 g/dL (calc)   AG Ratio 1.6 1.0 - 2.5 (calc)   Total Bilirubin 0.5 0.2 - 1.2 mg/dL   Alkaline phosphatase (APISO) 47 40 - 115 U/L   AST 17 10 - 35 U/L   ALT 22 9 - 46 U/L  Lipid Profile  Result Value Ref Range   Cholesterol 145 <200 mg/dL   HDL 44 >84>40 mg/dL   Triglycerides 132136 <440<150 mg/dL   LDL Cholesterol (Calc) 78 mg/dL (calc)   Total CHOL/HDL Ratio 3.3 <5.0 (calc)   Non-HDL Cholesterol (Calc) 101 <130 mg/dL (calc)      Assessment & Plan:   Problem List Items Addressed This Visit      Cardiovascular and  Mediastinum   Hypertension    Controlled hypertension.  BP goal < 130/80.  Pt is working on lifestyle modifications.  Taking medications tolerating well without side effects. No currently identified complications.  Plan: 1. Continue taking lisinopril 10 mg once daily 2. Obtain labs next visit  3. Encouraged heart healthy diet and increasing exercise to 30 minutes most days of the week. 4. Check BP 1-2 x per week at home, keep log, and bring to clinic at next appointment. 5. Follow up 3 months.        Relevant Medications   atorvastatin (LIPITOR) 20 MG tablet     Endocrine   Controlled diabetes mellitus type II without complication (HCC) - Primary    Uncontrolled DM with last A1c 7.7 and goal A1c < 7.0%.  Patient taking metformin with good results. - No known complications or hypoglycemia, but patient is not taking his CBG at home.  Plan:  1. Change therapy: Increase metformin to 1,000 mg bid. 2. Encourage improved lifestyle: - low carb/low glycemic diet reinforced prior education - Increase physical activity to 30 minutes most days of the week.  Explained that increased physical activity increases body's use of sugar for energy. 3. Check fasting am CBG and bring log to next visit for review 4. Continue ASA, ACEi and Statin 5. Advised to schedule DM ophtho exam, send record. 6. Follow-up 3 months       Relevant Medications   atorvastatin (LIPITOR) 20 MG tablet      Meds ordered this encounter  Medications  . atorvastatin (LIPITOR) 20 MG tablet    Sig: Take 1 tablet (20 mg total) by mouth daily.    Dispense:  30 tablet    Refill:  5    Order Specific Question:   Supervising Provider    Answer:   Smitty CordsKARAMALEGOS, ALEXANDER J [[id number]]    Follow up plan: Return in about 3 months (around 01/06/2018) for diabetes, hypertension.  Wilhelmina McardleLauren Reeva Davern, DNP, AGPCNP-BC Adult Gerontology Primary Care Nurse Practitioner [facility] 10/06/2017, 3:41  PM

## 2017-10-06 NOTE — Patient Instructions (Addendum)
Scott [name],   Thank you for coming in to clinic today.  1. Your provider would like to you have your annual eye exam. Please contact your current eye doctor   [facility] [address], [place], [place] [address] Phone: [phone] [email or web address]  2. Increase metformin to twice daily starting today.  Take with breakfast and supper. - Call clinic if you have cramping with the dose increase.  3. IN 2 WEEKS, start atorvastatin 20 mg once daily with supper.  This medicine helps prevent heart attack and stroke in patients who have diabetes. - Most common side effect is muscle cramps.  Call clinic if this happens.   Please schedule a follow-up appointment with Wilhelmina Mcardle, AGNP. Return in about 3 months (around 01/06/2018) for diabetes, hypertension.  If you have any other questions or concerns, please feel free to call the clinic or send a message through MyChart. You may also schedule an earlier appointment if necessary.  You will receive a survey after today's visit either digitally by e-mail or paper by [employer]. Your experiences and feedback matter to [place].  Please respond so we know how we are doing as we provide care for you.   Wilhelmina Mcardle, DNP, AGNP-BC Adult Gerontology Nurse Practitioner [facility], [facility]   Diabetes Mellitus and Nutrition When you have diabetes (diabetes mellitus), it is very important to have healthy eating habits because your blood sugar (glucose) levels are greatly affected by what you eat and drink. Eating healthy foods in the appropriate amounts, at about the same times every day, can help you:  Control your blood glucose.  Lower your risk of heart disease.  Improve your blood pressure.  Reach or maintain a healthy weight.  Every person with diabetes is different, and each person has different needs for a meal plan. Your health care provider may recommend that you work with a diet and nutrition specialist  (dietitian) to make a meal plan that is best for you. Your meal plan may vary depending on factors such as:  The calories you need.  The medicines you take.  Your weight.  Your blood glucose, blood pressure, and cholesterol levels.  Your activity level.  Other health conditions you have, such as heart or kidney disease.  How do carbohydrates affect me? Carbohydrates affect your blood glucose level more than any other type of food. Eating carbohydrates naturally increases the amount of glucose in your blood. Carbohydrate counting is a method for keeping track of how many carbohydrates you eat. Counting carbohydrates is important to keep your blood glucose at a healthy level, especially if you use insulin or take certain oral diabetes medicines. It is important to know how many carbohydrates you can safely have in each meal. This is different for every person. Your dietitian can help you calculate how many carbohydrates you should have at each meal and for snack. Foods that contain carbohydrates include:  Bread, cereal, rice, pasta, and crackers.  Potatoes and corn.  Peas, beans, and lentils.  Milk and yogurt.  Fruit and juice.  Desserts, such as cakes, cookies, ice cream, and candy.  How does alcohol affect me? Alcohol can cause a sudden decrease in blood glucose (hypoglycemia), especially if you use insulin or take certain oral diabetes medicines. Hypoglycemia can be a life-threatening condition. Symptoms of hypoglycemia (sleepiness, dizziness, and confusion) are similar to symptoms of having too much alcohol. If your health care provider says that alcohol is safe for you, follow  these guidelines:  Limit alcohol intake to no more than 1 drink per day for nonpregnant women and 2 drinks per day for men. One drink equals 12 oz of beer, 5 oz of wine, or 1 oz of hard liquor.  Do not drink on an empty stomach.  Keep yourself hydrated with water, diet soda, or unsweetened iced  tea.  Keep in mind that regular soda, juice, and other mixers may contain a lot of sugar and must be counted as carbohydrates.  What are tips for following this plan? Reading food labels  Start by checking the serving size on the label. The amount of calories, carbohydrates, fats, and other nutrients listed on the label are based on one serving of the food. Many foods contain more than one serving per package.  Check the total grams (g) of carbohydrates in one serving. You can calculate the number of servings of carbohydrates in one serving by dividing the total carbohydrates by 15. For example, if a food has 30 g of total carbohydrates, it would be equal to 2 servings of carbohydrates.  Check the number of grams (g) of saturated and trans fats in one serving. Choose foods that have low or no amount of these fats.  Check the number of milligrams (mg) of sodium in one serving. Most people should limit total sodium intake to less than 2,300 mg per day.  Always check the nutrition information of foods labeled as "low-fat" or "nonfat". These foods may be higher in added sugar or refined carbohydrates and should be avoided.  Talk to your dietitian to identify your daily goals for nutrients listed on the label. Shopping  Avoid buying canned, premade, or processed foods. These foods tend to be high in fat, sodium, and added sugar.  Shop around the outside edge of the grocery store. This includes fresh fruits and vegetables, bulk grains, fresh meats, and fresh dairy. Cooking  Use low-heat cooking methods, such as baking, instead of high-heat cooking methods like deep frying.  Cook using healthy oils, such as olive, canola, or sunflower oil.  Avoid cooking with butter, cream, or high-fat meats. Meal planning  Eat meals and snacks regularly, preferably at the same times every day. Avoid going long periods of time without eating.  Eat foods high in fiber, such as fresh fruits, vegetables,  beans, and whole grains. Talk to your dietitian about how many servings of carbohydrates you can eat at each meal.  Eat 4-6 ounces of lean protein each day, such as lean meat, chicken, fish, eggs, or tofu. 1 ounce is equal to 1 ounce of meat, chicken, or fish, 1 egg, or 1/4 cup of tofu.  Eat some foods each day that contain healthy fats, such as avocado, nuts, seeds, and fish. Lifestyle   Check your blood glucose regularly.  Exercise at least 30 minutes 5 or more days each week, or as told by your health care provider.  Take medicines as told by your health care provider.  Do not use any products that contain nicotine or tobacco, such as cigarettes and e-cigarettes. If you need help quitting, ask your health care provider.  Work with a Veterinary surgeon or diabetes educator to identify strategies to manage stress and any emotional and social challenges. What are some questions to ask my health care provider?  Do I need to meet with a diabetes educator?  Do I need to meet with a dietitian?  What number can I call if I have questions?  When are the  best times to check my blood glucose? Where to find more information:  American Diabetes Association: diabetes.org/food-and-fitness/food  Academy of Nutrition and Dietetics: https://www.vargas.com/www.eatright.org/resources/health/diseases-and-conditions/diabetes  [employer] Institute of Diabetes and Digestive and Kidney Diseases (NIH): FindJewelers.czwww.niddk.nih.gov/health-information/diabetes/overview/diet-eating-physical-activity Summary  A healthy meal plan will help you control your blood glucose and maintain a healthy lifestyle.  Working with a diet and nutrition specialist (dietitian) can help you make a meal plan that is best for you.  Keep in mind that carbohydrates and alcohol have immediate effects on your blood glucose levels. It is important to count carbohydrates and to use alcohol carefully. This information is not intended to replace advice given to you by your  health care provider. Make sure you discuss any questions you have with your health care provider. Document Released: 12/26/2004 Document Revised: 05/05/2016 Document Reviewed: 05/05/2016 Elsevier Interactive Patient Education  [employer]2018 Elsevier Inc.

## 2017-10-11 ENCOUNTER — Encounter: Payer: Self-pay | Admitting: Nurse Practitioner

## 2017-11-09 DIAGNOSIS — E119 Type 2 diabetes mellitus without complications: Secondary | ICD-10-CM | POA: Diagnosis not present

## 2017-11-09 LAB — HM DIABETES EYE EXAM

## 2018-01-05 ENCOUNTER — Encounter: Payer: Self-pay | Admitting: Nurse Practitioner

## 2018-01-05 NOTE — Assessment & Plan Note (Signed)
Uncontrolled DM with last A1c 7.7 and goal A1c < 7.0%.  Patient taking metformin with good results. - No known complications or hypoglycemia, but patient is not taking his CBG at home.  Plan:  1. Change therapy: Increase metformin to 1,000 mg bid. 2. Encourage improved lifestyle: - low carb/low glycemic diet reinforced prior education - Increase physical activity to 30 minutes most days of the week.  Explained that increased physical activity increases body's use of sugar for energy. 3. Check fasting am CBG and bring log to next visit for review 4. Continue ASA, ACEi and Statin 5. Advised to schedule DM ophtho exam, send record. 6. Follow-up 3 months

## 2018-01-05 NOTE — Assessment & Plan Note (Signed)
Controlled hypertension.  BP goal < 130/80.  Pt is working on lifestyle modifications.  Taking medications tolerating well without side effects. No currently identified complications.  Plan: 1. Continue taking lisinopril 10 mg once daily 2. Obtain labs next visit  3. Encouraged heart healthy diet and increasing exercise to 30 minutes most days of the week. 4. Check BP 1-2 x per week at home, keep log, and bring to clinic at next appointment. 5. Follow up 3 months.

## 2018-01-08 ENCOUNTER — Other Ambulatory Visit: Payer: Self-pay

## 2018-01-08 ENCOUNTER — Ambulatory Visit: Payer: BLUE CROSS/BLUE SHIELD | Admitting: Nurse Practitioner

## 2018-01-08 ENCOUNTER — Other Ambulatory Visit: Payer: Self-pay | Admitting: Nurse Practitioner

## 2018-01-08 ENCOUNTER — Encounter: Payer: Self-pay | Admitting: Nurse Practitioner

## 2018-01-08 ENCOUNTER — Ambulatory Visit (INDEPENDENT_AMBULATORY_CARE_PROVIDER_SITE_OTHER): Payer: BLUE CROSS/BLUE SHIELD | Admitting: Nurse Practitioner

## 2018-01-08 VITALS — BP 92/62 | HR 106 | Temp 98.3°F | Ht 67.0 in | Wt 156.2 lb

## 2018-01-08 DIAGNOSIS — Z1159 Encounter for screening for other viral diseases: Secondary | ICD-10-CM | POA: Diagnosis not present

## 2018-01-08 DIAGNOSIS — E119 Type 2 diabetes mellitus without complications: Secondary | ICD-10-CM | POA: Diagnosis not present

## 2018-01-08 DIAGNOSIS — Z23 Encounter for immunization: Secondary | ICD-10-CM

## 2018-01-08 DIAGNOSIS — I10 Essential (primary) hypertension: Secondary | ICD-10-CM | POA: Diagnosis not present

## 2018-01-08 LAB — POCT GLYCOSYLATED HEMOGLOBIN (HGB A1C): Hemoglobin A1C: 5.9 % — AB (ref 4.0–5.6)

## 2018-01-08 MED ORDER — LISINOPRIL 2.5 MG PO TABS: 2.5000 mg | ORAL_TABLET | Freq: Every day | ORAL | 1 refills | Status: DC

## 2018-01-08 MED ORDER — METFORMIN HCL 500 MG PO TABS: 500.0000 mg | ORAL_TABLET | Freq: Every day | ORAL | 3 refills | Status: DC

## 2018-01-08 NOTE — Assessment & Plan Note (Addendum)
Controlled hypertension with BP at low normal.  BP goal < 130/80.  Pt is working on lifestyle modifications.  Taking medications tolerating well without side effects. No currently identified complications other than near hypotension on lisinopril.  Plan: 1. Reduce lisinopril 2.5 mg once daily 2. Obtain labs in next 7 days  3. Encouraged heart healthy diet and increasing exercise to 30 minutes most days of the week. 4. Check BP 1-2 x per week at home, keep log, and bring to clinic at next appointment. 5. Follow up 6 months.

## 2018-01-08 NOTE — Assessment & Plan Note (Signed)
Controlled DM with today's A1c = 5.9% and last A1c 7.7 Goal A1c < 7.0%.  Patient taking metformin with good results. - No known complications or hypoglycemia, but patient is not taking his CBG at home.  Plan:  1. Change therapy: Patient continued to take metformin 500 mg bid.  Reduce metformin to 500 mg at breakfast. 2. Encourage improved lifestyle: - low carb/low glycemic diet reinforced prior education - Increase physical activity to 30 minutes most days of the week.  Explained that increased physical activity increases body's use of sugar for energy. 3. Check fasting am CBG and bring log to next visit for review 4. Continue ASA, ACEi and Statin 5. DM Foot exam done today with abnormal findings.   6. Labs today 7. Follow-up 6 months

## 2018-01-08 NOTE — Patient Instructions (Addendum)
Scott [name],   Thank you for coming in to clinic today.  1. REDUCE lisinopril to 2.5 mg once daily  2. REDUCE metformin to 500 mg once daily.  3. You will be due for FASTING BLOOD WORK.  This means you should eat no food or drink after midnight.  Drink only water or coffee without cream/sugar on the morning of your lab visit. - Please go ahead and schedule a "Lab Only" visit in the morning at the clinic for lab draw in the next 7 days. - Your results will be available about 2-3 days after blood draw.  If you have set up a MyChart account, you can can log in to MyChart online to view your results and a brief explanation. Also, we can discuss your results together at your next office visit if you would like.  Please schedule a follow-up appointment with Wilhelmina Mcardle, AGNP. Return in about 6 months (around 07/09/2018) for diabetes, hypertension.  If you have any other questions or concerns, please feel free to call the clinic or send a message through MyChart. You may also schedule an earlier appointment if necessary.  You will receive a survey after today's visit either digitally by e-mail or paper by [employer]. Your experiences and feedback matter to [place].  Please respond so we know how we are doing as we provide care for you.   Wilhelmina Mcardle, DNP, AGNP-BC Adult Gerontology Nurse Practitioner [facility], [facility]

## 2018-01-08 NOTE — Progress Notes (Signed)
Subjective:    Patient ID: Scott [name], male    DOB: Apr 18, 1960, 57 y.o.   MRN: [id number]  Scott [name] is a 57 y.o. male presenting on 01/08/2018 for Hypertension and Diabetes  HPI Hypertension - He is not checking BP at home or outside of clinic.  He believes his meter is inaccurate as it is always much higher than in clinic. - Current medications: lisinopril 10 mg once daily, tolerating well without side effects - He is not currently symptomatic. - Pt denies headache, lightheadedness, dizziness, changes in vision, chest tightness/pressure, palpitations, leg swelling, sudden loss of speech or loss of consciousness. - He  reports an exercise routine that includes active work, 5 days per week.  No regular exercise outside of work, but keeps active lifestyle - His diet is low in salt, low in fat, and low in carbohydrates.   Diabetes Pt presents today for follow up of Type 2 diabetes mellitus. He is not checking CBG at home - Current diabetic medications include: metformin - He is not currently symptomatic.  - He denies polydipsia, polyphagia, polyuria, headaches, diaphoresis, shakiness, chills, pain, numbness or tingling in extremities and changes in vision.   - Clinical course has been improving. - Weight trend: decreasing steadily  PREVENTION: Eye exam current (within one year): yes Foot exam current (within one year): no  Lipid/ASCVD risk reduction - on statin: yes Kidney protection - on ace or arb: yes Recent Labs    08/25/17 1610 01/08/18 1512  HGBA1C 7.7 5.9*   Social History   Tobacco Use  . Smoking status: Never Smoker  . Smokeless tobacco: Never Used  Substance Use Topics  . Alcohol use: Yes    Alcohol/week: 0.0 standard drinks    Comment: occasional  . Drug use: No    Review of Systems Per HPI unless specifically indicated above     Objective:    BP 92/62 (BP Location: Right Arm, Patient Position: Sitting, Cuff Size: Normal)   Pulse (!) 106    Temp 98.3 F (36.8 C) (Oral)   Ht 5\' 7"  (1.702 m)   Wt 156 lb 3.2 oz (70.9 kg)   BMI 24.46 kg/m   Wt Readings from Last 3 Encounters:  01/08/18 156 lb 3.2 oz (70.9 kg)  10/06/17 154 lb 9.6 oz (70.1 kg)  08/25/17 162 lb 3.2 oz (73.6 kg)    Physical Exam  Constitutional: He is oriented to person, place, and time. He appears well-developed and well-nourished. No distress.  HENT:  Head: Normocephalic and atraumatic.  Neck: Normal range of motion. Neck supple. Carotid bruit is not present.  Cardiovascular: Normal rate, regular rhythm, S1 normal, S2 normal, normal heart sounds and intact distal pulses.  Pulmonary/Chest: Effort normal and breath sounds normal. No respiratory distress.  Musculoskeletal: He exhibits no edema (pedal).  Neurological: He is alert and oriented to person, place, and time.  Skin: Skin is warm and dry.  Psychiatric: He has a normal mood and affect. His behavior is normal.  Vitals reviewed.  Diabetic Foot Form - Detailed   Diabetic Foot Exam - detailed Diabetic Foot exam was performed with the following findings:  Yes 01/08/2018  3:22 PM  Visual Foot Exam completed.:  Yes  Can the patient see the bottom of their feet?:  Yes Are the shoes appropriate in style and fit?:  Yes Is there swelling or and abnormal foot shape?:  No Is there a claw toe deformity?:  No Is there elevated skin  temparature?:  No Is there foot or ankle muscle weakness?:  No Normal Range of Motion:  Yes Pulse Foot Exam completed.:  Yes  Sensory Foot Exam Completed.:  Yes Semmes-Weinstein Monofilament Test R Site 1-Great Toe:  Pos L Site 1-Great Toe:  Neg         Results for orders placed or performed in visit on 11/11/17  HM DIABETES EYE EXAM  Result Value Ref Range   HM Diabetic Eye Exam No Retinopathy No Retinopathy      Assessment & Plan:   Problem List Items Addressed This Visit      Cardiovascular and Mediastinum   Hypertension    Controlled hypertension with BP at low  normal.  BP goal < 130/80.  Pt is working on lifestyle modifications.  Taking medications tolerating well without side effects. No currently identified complications other than near hypotension on lisinopril.  Plan: 1. Reduce lisinopril 2.5 mg once daily 2. Obtain labs in next 7 days  3. Encouraged heart healthy diet and increasing exercise to 30 minutes most days of the week. 4. Check BP 1-2 x per week at home, keep log, and bring to clinic at next appointment. 5. Follow up 6 months.        Relevant Medications   lisinopril (PRINIVIL,ZESTRIL) 2.5 MG tablet   Other Relevant Orders   Lipid panel   COMPLETE METABOLIC PANEL WITH GFR     Endocrine   Controlled diabetes mellitus type II without complication (HCC) - Primary    Controlled DM with today's A1c = 5.9% and last A1c 7.7 Goal A1c < 7.0%.  Patient taking metformin with good results. - No known complications or hypoglycemia, but patient is not taking his CBG at home.  Plan:  1. Change therapy: Patient continued to take metformin 500 mg bid.  Reduce metformin to 500 mg at breakfast. 2. Encourage improved lifestyle: - low carb/low glycemic diet reinforced prior education - Increase physical activity to 30 minutes most days of the week.  Explained that increased physical activity increases body's use of sugar for energy. 3. Check fasting am CBG and bring log to next visit for review 4. Continue ASA, ACEi and Statin 5. DM Foot exam done today with abnormal findings.   6. Labs today 7. Follow-up 6 months       Relevant Medications   metFORMIN (GLUCOPHAGE) 500 MG tablet   lisinopril (PRINIVIL,ZESTRIL) 2.5 MG tablet   Other Relevant Orders   POCT glycosylated hemoglobin (Hb A1C) (Completed)   Lipid panel   COMPLETE METABOLIC PANEL WITH GFR    Other Visit Diagnoses    Need for immunization against influenza       Pt age < 20 administer Quad flu today.   Encounter for hepatitis C screening test for low risk patient       Pt  agrees to screening.  Low risk.  Labs today.   Relevant Orders   Hepatitis C antibody      Meds ordered this encounter  Medications  . metFORMIN (GLUCOPHAGE) 500 MG tablet    Sig: Take 1 tablet (500 mg total) by mouth daily with breakfast.    Dispense:  90 tablet    Refill:  3    Order Specific Question:   Supervising Provider    Answer:   Smitty Cords [[id number]]  . lisinopril (PRINIVIL,ZESTRIL) 2.5 MG tablet    Sig: Take 1 tablet (2.5 mg total) by mouth daily.    Dispense:  90 tablet  Refill:  1    Order Specific Question:   Supervising Provider    Answer:   Smitty Cords [[id number]]    Follow up plan: Return in about 6 months (around 07/09/2018) for diabetes, hypertension.  Wilhelmina Mcardle, DNP, AGPCNP-BC Adult Gerontology Primary Care Nurse Practitioner [facility] 01/08/2018, 3:07 PM

## 2018-01-15 ENCOUNTER — Other Ambulatory Visit: Payer: BLUE CROSS/BLUE SHIELD

## 2018-01-15 DIAGNOSIS — I10 Essential (primary) hypertension: Secondary | ICD-10-CM | POA: Diagnosis not present

## 2018-01-15 DIAGNOSIS — E119 Type 2 diabetes mellitus without complications: Secondary | ICD-10-CM | POA: Diagnosis not present

## 2018-01-15 DIAGNOSIS — Z1159 Encounter for screening for other viral diseases: Secondary | ICD-10-CM | POA: Diagnosis not present

## 2018-01-15 LAB — COMPLETE METABOLIC PANEL WITH GFR
AG Ratio: 1.7 (calc) (ref 1.0–2.5)
ALT: 25 U/L (ref 9–46)
AST: 19 U/L (ref 10–35)
Albumin: 4.6 g/dL (ref 3.6–5.1)
Alkaline phosphatase (APISO): 51 U/L (ref 40–115)
BUN: 13 mg/dL (ref 7–25)
CO2: 31 mmol/L (ref 20–32)
Calcium: 9.6 mg/dL (ref 8.6–10.3)
Chloride: 100 mmol/L (ref 98–110)
Creat: 0.92 mg/dL (ref 0.70–1.33)
GFR, Est African American: 108 mL/min/{1.73_m2} (ref >=60)
GFR, Est Non African American: 93 mL/min/{1.73_m2} (ref >=60)
Globulin: 2.7 g/dL (calc) (ref 1.9–3.7)
Glucose, Bld: 140 mg/dL — ABNORMAL HIGH (ref 65–99)
Potassium: 4.8 mmol/L (ref 3.5–5.3)
Sodium: 138 mmol/L (ref 135–146)
Total Bilirubin: 0.6 mg/dL (ref 0.2–1.2)
Total Protein: 7.3 g/dL (ref 6.1–8.1)

## 2018-01-15 LAB — LIPID PANEL
Cholesterol: 109 mg/dL (ref <200)
HDL: 41 mg/dL (ref >40)
LDL Cholesterol (Calc): 48 mg/dL (calc)
Non-HDL Cholesterol (Calc): 68 mg/dL (calc) (ref <130)
Total CHOL/HDL Ratio: 2.7 (calc) (ref <5.0)
Triglycerides: 115 mg/dL (ref <150)

## 2018-01-16 LAB — HEPATITIS C ANTIBODY
Hepatitis C Ab: NONREACTIVE
SIGNAL TO CUT-OFF: 0.02 (ref <1.00)

## 2018-01-19 ENCOUNTER — Telehealth: Payer: Self-pay

## 2018-01-26 NOTE — Telephone Encounter (Signed)
Open in error

## 2018-02-20 ENCOUNTER — Other Ambulatory Visit: Payer: Self-pay | Admitting: Nurse Practitioner

## 2018-02-20 DIAGNOSIS — I10 Essential (primary) hypertension: Secondary | ICD-10-CM

## 2018-02-20 DIAGNOSIS — E119 Type 2 diabetes mellitus without complications: Secondary | ICD-10-CM

## 2018-02-22 MED ORDER — LISINOPRIL 2.5 MG PO TABS: 2.5000 mg | ORAL_TABLET | Freq: Every day | ORAL | 1 refills | Status: DC

## 2018-02-23 IMAGING — CR DG ABDOMEN 2V
2 series · 2 of 2 positions shown · non-contrast
Comparison: CT abdomen/ pelvis from 09/27/2015.

CLINICAL DATA: Small bowel obstruction.

EXAM:
ABDOMEN - 2 VIEW

[abdomen erect]
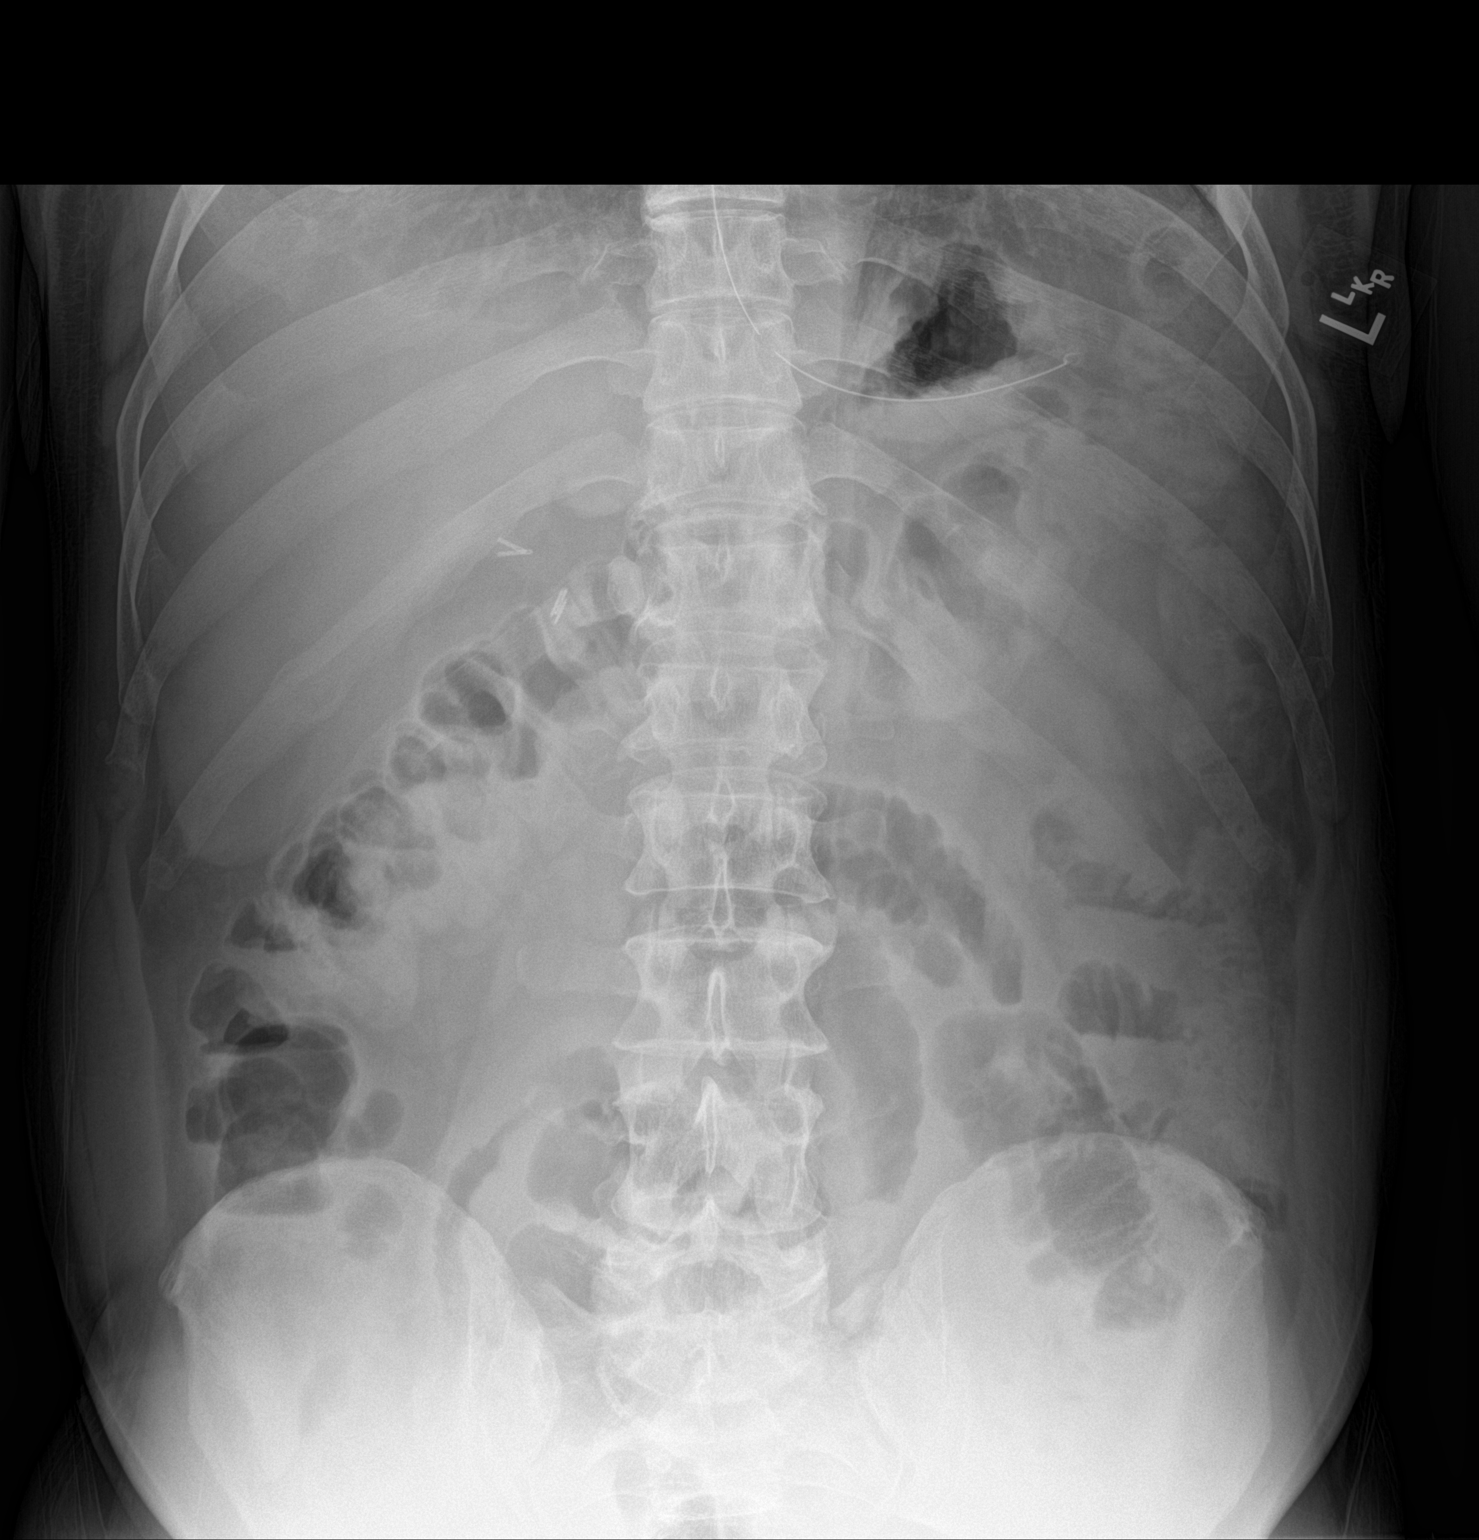

[abdomen supine]
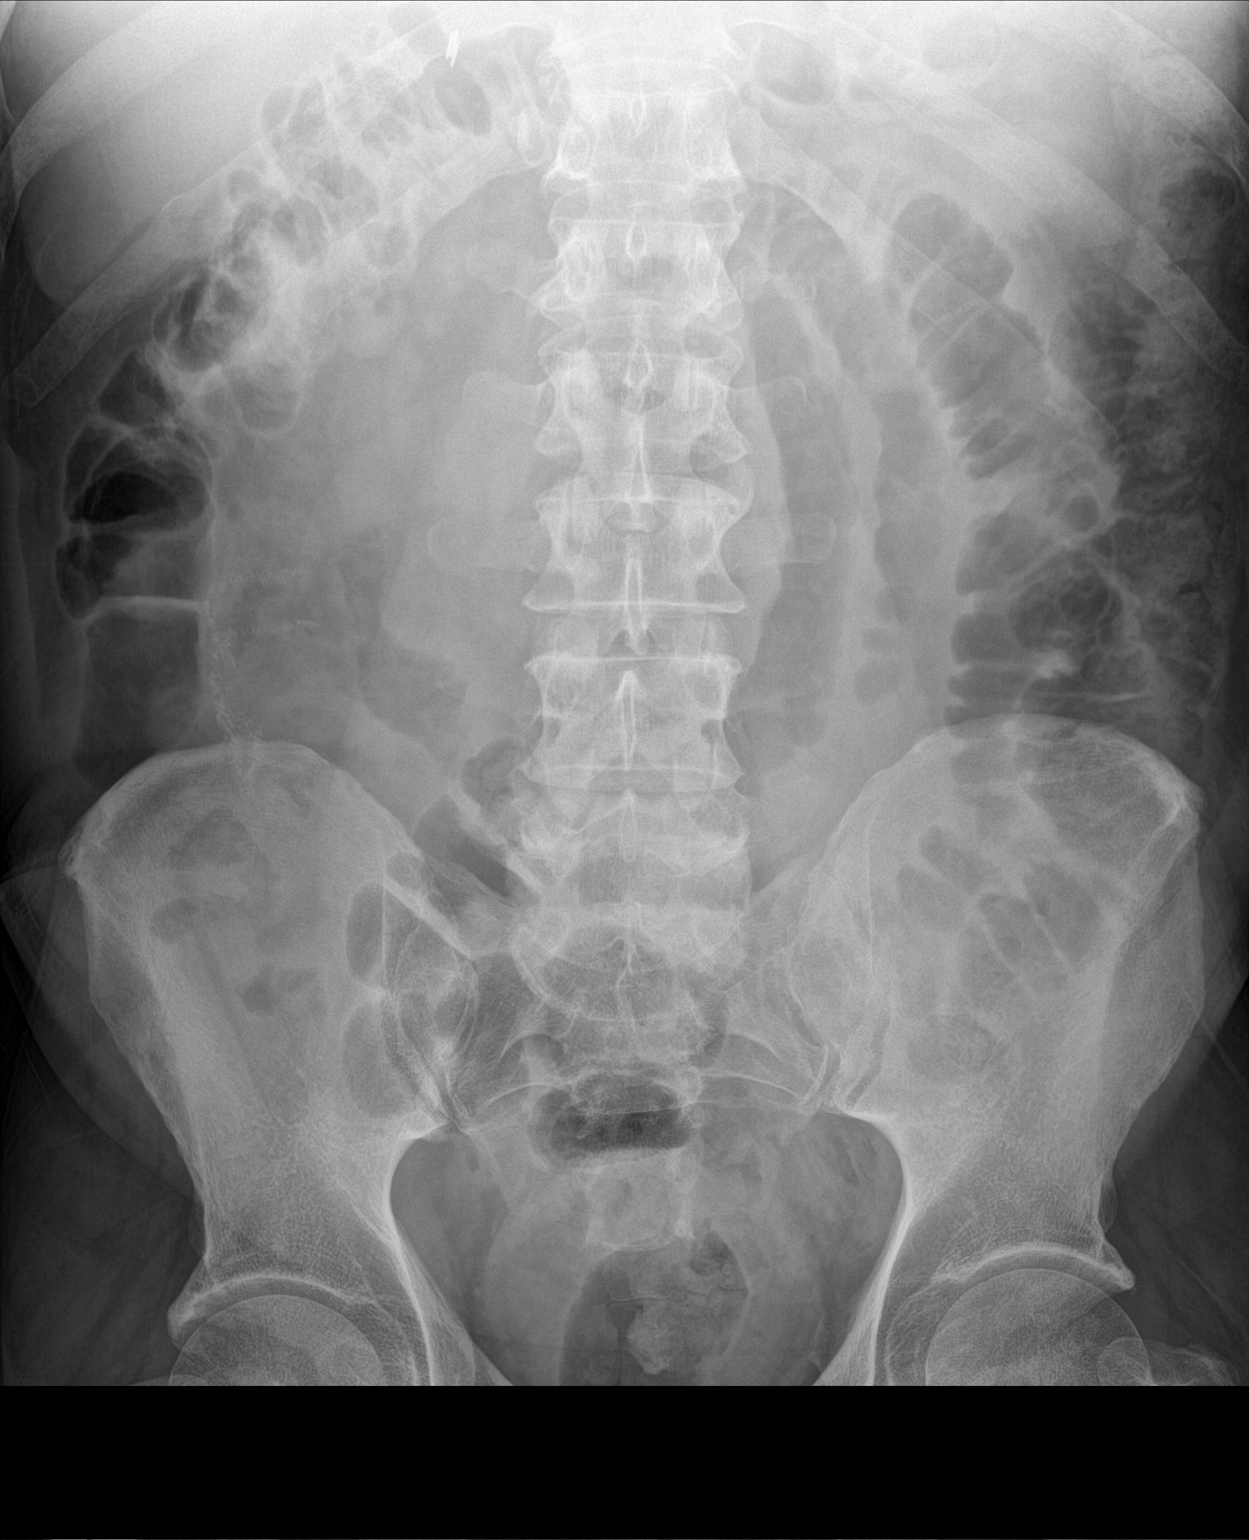

[2 of 2 positions shown; findings below may reference images not displayed]

FINDINGS: Enteric tube terminates in the gastric fundus. Cholecystectomy clips
are seen in the right upper quadrant of the abdomen. Mildly dilated
small bowel loops in the left abdomen up to the 3.8 cm diameter, not
definitely changed. Surgical sutures in the right abdomen. Mild gas
in stool in the colon. No evidence of pneumatosis or
pneumoperitoneum. No pathologic soft tissue calcification.
IMPRESSION: Stable mildly dilated small bowel loops in the left abdomen,
suggesting a persistent partial mid to distal small bowel
obstruction.

## 2018-02-26 IMAGING — CR DG ABDOMEN 2V
1 series · 3 of 3 positions shown · non-contrast
Comparison: 09/28/2015

CLINICAL DATA: Partial small bowel obstruction.  Vomiting.

EXAM:
ABDOMEN - 2 VIEW

[Series 1: dg abd 2 views · 0.14mm/px · 3 of 3 slices shown]
[im 1/3]
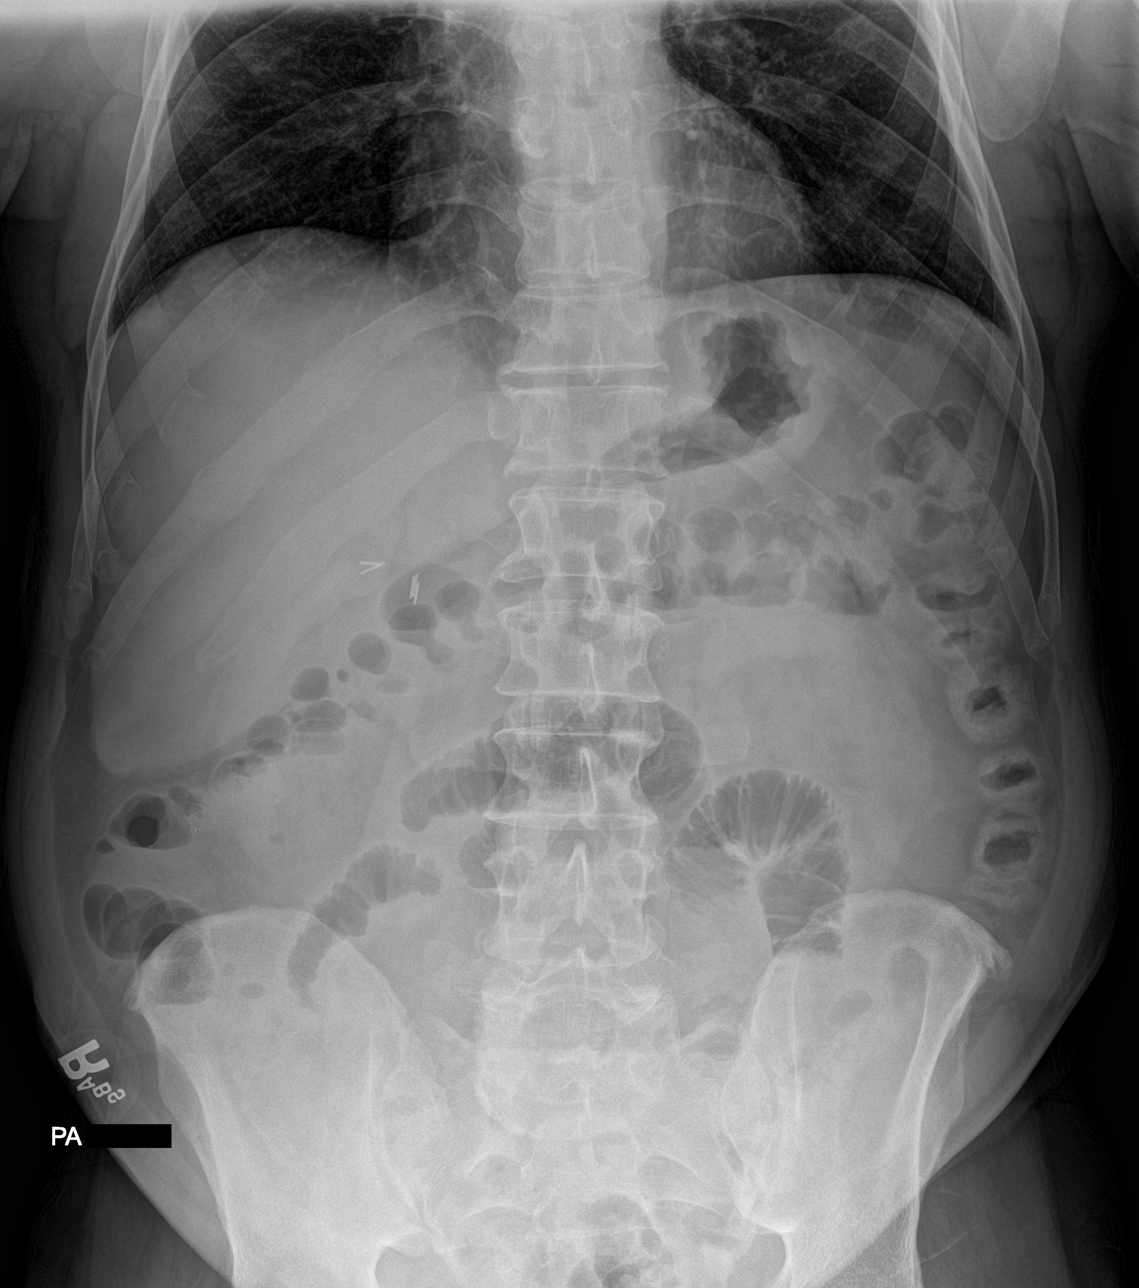
[im 2/3]
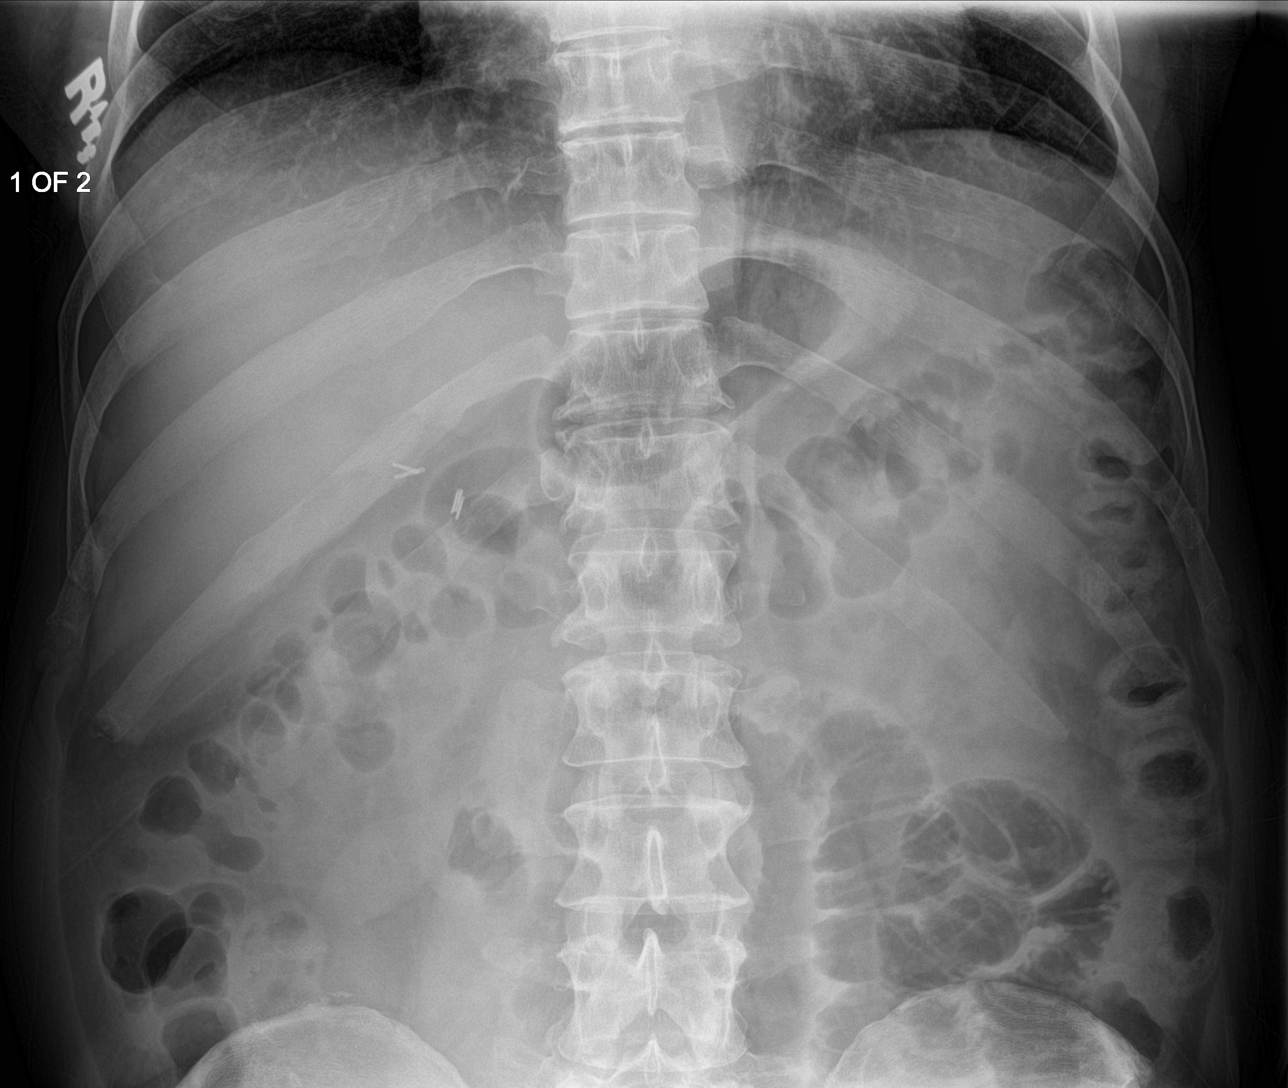
[im 3/3]
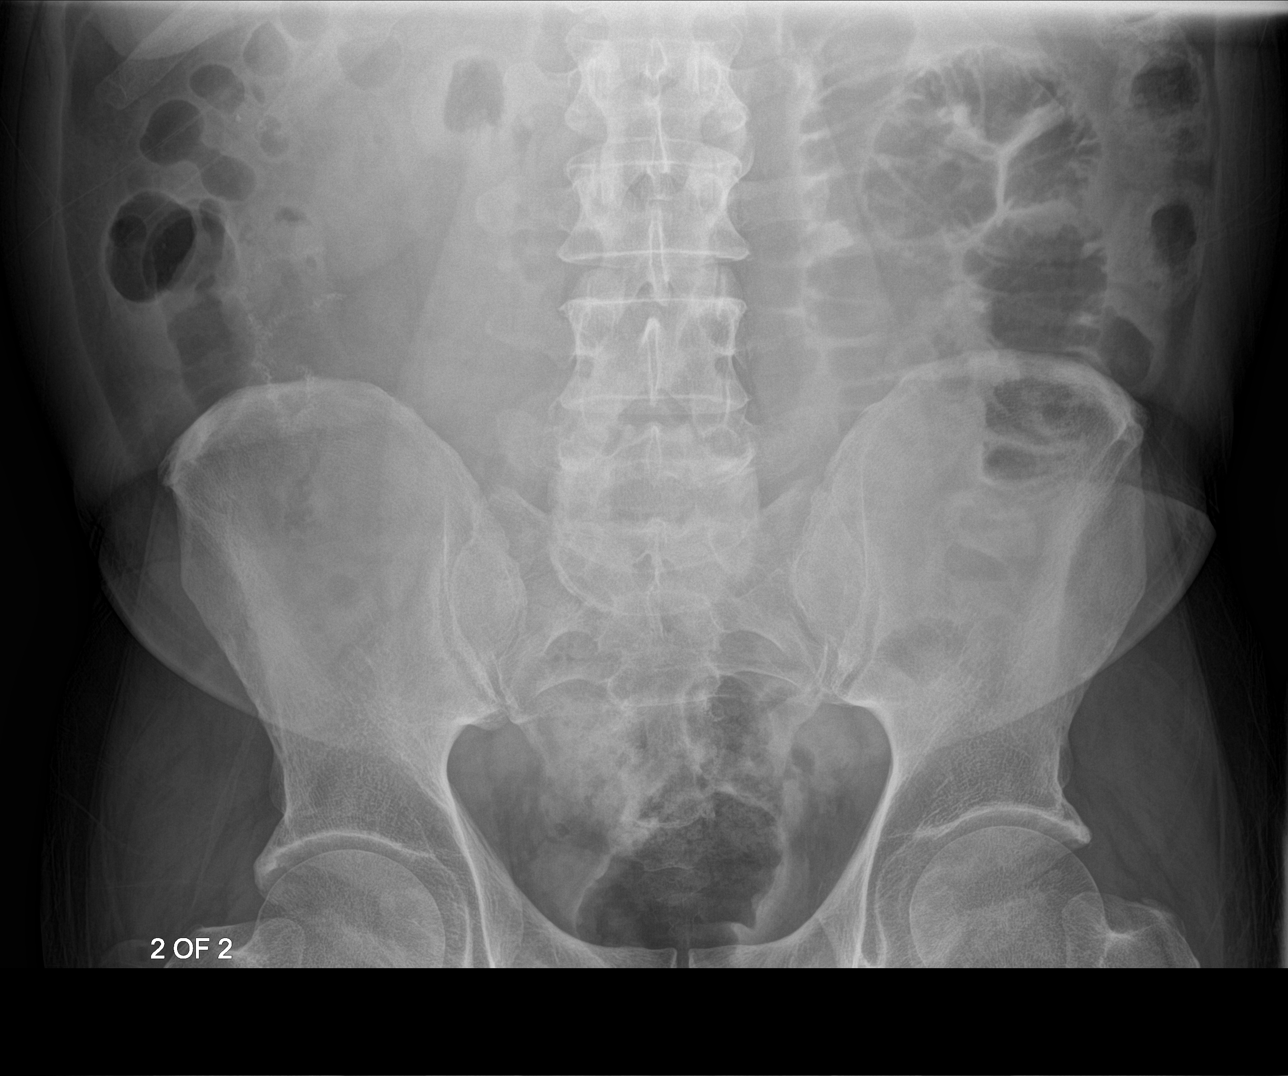

[3 of 3 positions shown; findings below may reference images not displayed]

FINDINGS: Mildly dilated small bowel loops in the mid abdomen with scattered
air-fluid levels again noted, compatible with persistent partial
small bowel obstruction. Gas throughout nondistended large bowel.
Prior cholecystectomy. No organomegaly or free air. Visualized lung
bases clear.
IMPRESSION: Persistent partial small bowel obstruction pattern, not
significantly changed.

## 2018-05-10 ENCOUNTER — Ambulatory Visit: Payer: BLUE CROSS/BLUE SHIELD | Admitting: Nurse Practitioner

## 2018-06-10 ENCOUNTER — Ambulatory Visit (INDEPENDENT_AMBULATORY_CARE_PROVIDER_SITE_OTHER): Payer: BLUE CROSS/BLUE SHIELD | Admitting: Nurse Practitioner

## 2018-06-10 ENCOUNTER — Other Ambulatory Visit: Payer: Self-pay

## 2018-06-10 ENCOUNTER — Encounter: Payer: Self-pay | Admitting: Nurse Practitioner

## 2018-06-10 VITALS — BP 152/96 | HR 89 | Temp 97.6°F | Resp 16 | Ht 67.0 in | Wt 164.6 lb

## 2018-06-10 DIAGNOSIS — G8929 Other chronic pain: Secondary | ICD-10-CM

## 2018-06-10 DIAGNOSIS — G4700 Insomnia, unspecified: Secondary | ICD-10-CM | POA: Diagnosis not present

## 2018-06-10 DIAGNOSIS — I10 Essential (primary) hypertension: Secondary | ICD-10-CM

## 2018-06-10 DIAGNOSIS — R51 Headache: Secondary | ICD-10-CM | POA: Diagnosis not present

## 2018-06-10 DIAGNOSIS — R519 Headache, unspecified: Secondary | ICD-10-CM

## 2018-06-10 MED ORDER — LISINOPRIL 10 MG PO TABS: 10.0000 mg | ORAL_TABLET | Freq: Every day | ORAL | 1 refills | Status: DC

## 2018-06-10 NOTE — Progress Notes (Signed)
Subjective:    Patient ID: Scott [name], male    DOB: 28-Oct-1960, 58 y.o.   MRN: [id number]  Scott [name] is a 58 y.o. male presenting on 06/10/2018 for Headache (having troble sleeping, eyes hurting)   HPI Insomnia Lack of sleep causes sensations of weakness. Patient sleeps about 2.5-4 hours and wakes.  Then falls asleep again much later for about 1 hour before needing to get up for his day.  Mind starts wandering (not about life stressors).  Patient has no trouble falling asleep.  - Last night took a PM pill and slept better without night-time awakening. - No alcohol consumption. - Insomnia has only occurred for about 2 weeks.  Eye pain/headache Patient is having eye pain since insomnia.  Is improved today after one good night sleep.  Mild headache.  Hypertension - Patient has forgotten to take medications about 3 times per week and is pretty normal pattern of medication administration.   - He is not checking BP at home or outside of clinic.    - Current medications: lisinopril 2.5 mg, tolerating well without side effects - He is symptomatic with headache. - Pt denies lightheadedness, dizziness, changes in vision, chest tightness/pressure, palpitations, leg swelling, sudden loss of speech or loss of consciousness. - He  reports no regular exercise routine. - His diet is moderate in salt, moderate in fat, and moderate in carbohydrates.  Social History   Tobacco Use  . Smoking status: Never Smoker  . Smokeless tobacco: Never Used  Substance Use Topics  . Alcohol use: Yes    Alcohol/week: 0.0 standard drinks    Comment: occasional  . Drug use: No    Review of Systems Per HPI unless specifically indicated above     Objective:    BP (!) 168/105   Pulse 89   Temp 97.6 F (36.4 C) (Oral)   Resp 16   Ht  (1.702 m)   Wt 164 lb 9.6 oz (74.7 kg)   SpO2 100%   BMI 25.78 kg/m   Wt Readings from Last 3 Encounters:  06/10/18 164 lb 9.6 oz (74.7 kg)    01/08/18 156 lb 3.2 oz (70.9 kg)  10/06/17 154 lb 9.6 oz (70.1 kg)    Physical Exam Vitals signs reviewed.  Constitutional:      General: He is not in acute distress.    Appearance: He is well-developed.  HENT:     Head: Normocephalic and atraumatic.  Eyes:     Pupils: Pupils are equal, round, and reactive to light.     Funduscopic exam:    Right eye: No hemorrhage, exudate, AV nicking or papilledema. Red reflex present.        Left eye: No hemorrhage, exudate, AV nicking or papilledema. Red reflex present. Cardiovascular:     Rate and Rhythm: Normal rate and regular rhythm.     Pulses:          Radial pulses are 2+ on the right side and 2+ on the left side.       Posterior tibial pulses are 1+ on the right side and 1+ on the left side.     Heart sounds: Normal heart sounds, S1 normal and S2 normal.  Pulmonary:     Effort: Pulmonary effort is normal. No respiratory distress.     Breath sounds: Normal breath sounds and air entry.  Musculoskeletal:     Right lower leg: No edema.     Left lower leg:  No edema.  Skin:    General: Skin is warm and dry.     Capillary Refill: Capillary refill takes less than 2 seconds.  Neurological:     Mental Status: He is alert and oriented to person, place, and time.     Cranial Nerves: No cranial nerve deficit.     Sensory: No sensory deficit.     Gait: Gait normal.     Deep Tendon Reflexes: Reflexes are normal and symmetric.  Psychiatric:        Attention and Perception: Attention normal.        Mood and Affect: Mood and affect normal. Mood is not anxious or depressed.        Speech: Speech normal.        Behavior: Behavior normal. Behavior is cooperative.    Results for orders placed or performed in visit on 01/08/18  Lipid panel  Result Value Ref Range   Cholesterol 109 <200 mg/dL   HDL 41 >93 mg/dL   Triglycerides 810 <175 mg/dL   LDL Cholesterol (Calc) 48 mg/dL (calc)   Total CHOL/HDL Ratio 2.7 <5.0 (calc)   Non-HDL Cholesterol  (Calc) 68 <130 mg/dL (calc)  COMPLETE METABOLIC PANEL WITH GFR  Result Value Ref Range   Glucose, Bld 140 (H) 65 - 99 mg/dL   BUN 13 7 - 25 mg/dL   Creat 1.02 5.85 - 2.77 mg/dL   GFR, Est Non African American 93 > OR = 60 mL/min/1.23m2   GFR, Est African American 108 > OR = 60 mL/min/1.68m2   BUN/Creatinine Ratio NOT APPLICABLE 6 - 22 (calc)   Sodium 138 135 - 146 mmol/L   Potassium 4.8 3.5 - 5.3 mmol/L   Chloride 100 98 - 110 mmol/L   CO2 31 20 - 32 mmol/L   Calcium 9.6 8.6 - 10.3 mg/dL   Total Protein 7.3 6.1 - 8.1 g/dL   Albumin 4.6 3.6 - 5.1 g/dL   Globulin 2.7 1.9 - 3.7 g/dL (calc)   AG Ratio 1.7 1.0 - 2.5 (calc)   Total Bilirubin 0.6 0.2 - 1.2 mg/dL   Alkaline phosphatase (APISO) 51 40 - 115 U/L   AST 19 10 - 35 U/L   ALT 25 9 - 46 U/L  Hepatitis C antibody  Result Value Ref Range   Hepatitis C Ab NON-REACTIVE NON-REACTI   SIGNAL TO CUT-OFF 0.02 <1.00  POCT glycosylated hemoglobin (Hb A1C)  Result Value Ref Range   Hemoglobin A1C 5.9 (A) 4.0 - 5.6 %   HbA1c POC (<> result, manual entry)     HbA1c, POC (prediabetic range)     HbA1c, POC (controlled diabetic range)        Assessment & Plan:   Problem List Items Addressed This Visit      Cardiovascular and Mediastinum   Hypertension   Relevant Medications   lisinopril (PRINIVIL,ZESTRIL) 10 MG tablet    Other Visit Diagnoses    Insomnia, unspecified type    -  Primary   Chronic nonintractable headache, unspecified headache type          # Uncontrolled hypertension.  BP goal < 130/80.  Pt is not currently working on lifestyle modifications.  Taking medications tolerating well without side effects.  Likely contributing to headache.  Plan: 1. INCREASE lisinopril to 10 mg once daily 2. Last labs with normal kidney function  3. Encouraged heart healthy diet and increasing exercise to 30 minutes most days of the week. 4. Check BP 1-2 x per week  at home, keep log, and bring to clinic at next appointment. 5. Follow up  as scheduled in about 1 month.  # Insomnia Acute insomnia, but affecting patient's ability to function.  Now with only 2-3 hours sleep.  Only aided by benadryl x 1 night with good response. Suspect is going to be self-limited.  May be contributing to headache.  Plan: 1. Encourage good sleep hygiene 2. May continue benadryl 25 mg once daily at bedtime for 14 nights. 3. May also consider future melatonin or Trazodone.   # Headache - generalized Exact cause unknown, but likely due to hypertension uncontrolled and insomnia.  Continue with management of both conditions as above. Revisit at follow-up if needed.   Meds ordered this encounter  Medications  . lisinopril (PRINIVIL,ZESTRIL) 10 MG tablet    Sig: Take 1 tablet (10 mg total) by mouth daily.    Dispense:  90 tablet    Refill:  1    Order Specific Question:   Supervising Provider    Answer:   Smitty Cords [[id number]]    Follow up plan: No follow-ups on file.  Wilhelmina Mcardle, DNP, AGPCNP-BC Adult Gerontology Primary Care Nurse Practitioner [facility] 06/10/2018, 10:06 AM

## 2018-06-10 NOTE — Patient Instructions (Addendum)
Scott [name],   Thank you for coming in to clinic today.  1. Continue benadryl 25 mg once daily at bedtime as needed for sleep. - Take every night for 14 nights.  Then, only as needed.  If needing for more than 6 weeks, call clinic.  2. Sleep Hygiene Tips  Take medicines only as directed by your health care provider.  Keep regular sleeping and waking hours. Avoid naps.  Keep a sleep diary to help you and your health care provider figure out what could be causing your insomnia. Include:  When you sleep.  When you wake up during the night.  How well you sleep.  How rested you feel the next day.  Any side effects of medicines you are taking.  What you eat and drink.  Make your bedroom a comfortable place where it is easy to fall asleep:  Put up shades or special blackout curtains to block light from outside.  Use a white noise machine to block noise.  Keep the temperature cool.  Exercise regularly as directed by your health care provider. Avoid exercising right before bedtime.  Use relaxation techniques to manage stress. Ask your health care provider to suggest some techniques that may work well for you. These may include:  Breathing exercises.  Routines to release muscle tension.  Visualizing peaceful scenes.  Cut back on alcohol, caffeinated beverages, and cigarettes, especially close to bedtime. These can disrupt your sleep.  Do not overeat or eat spicy foods right before bedtime. This can lead to digestive discomfort that can make it hard for you to sleep.  Limit screen use before bedtime. This includes:  Watching TV.  Using your smartphone, tablet, and computer.  Stick to a routine. This can help you fall asleep faster. Try to do a quiet activity, brush your teeth, and go to bed at the same time each night.  Get out of bed if you are still awake after 15 minutes of trying to sleep. Keep the lights down, but try reading or doing a quiet activity. When  you feel sleepy, go back to bed.  Make sure that you drive carefully. Avoid driving if you feel very sleepy.  Keep all follow-up appointments as directed by your health care provider. This is important.  3. Blood pressure: - INCREASE lisinopril to 10 mg once daily.  Please schedule a follow-up appointment with Wilhelmina Mcardle, AGNP. Return if symptoms worsen or fail to improve.  If you have any other questions or concerns, please feel free to call the clinic or send a message through MyChart. You may also schedule an earlier appointment if necessary.  You will receive a survey after today's visit either digitally by e-mail or paper by [employer]. Your experiences and feedback matter to [place].  Please respond so we know how we are doing as we provide care for you.   Wilhelmina Mcardle, DNP, AGNP-BC Adult Gerontology Nurse Practitioner [facility]

## 2018-06-12 ENCOUNTER — Encounter: Payer: Self-pay | Admitting: Nurse Practitioner

## 2018-06-27 IMAGING — CR DG ABDOMEN 2V
1 series · 4 of 4 positions shown · non-contrast
Comparison: CT 5125

CLINICAL DATA: Chronic abdominal pain for 2 days. Prior bowel
obstruction

EXAM:
ABDOMEN - 2 VIEW

[Series 1: dg abd 2 views · 0.14mm/px · 4 of 4 slices shown]
[im 1/4]
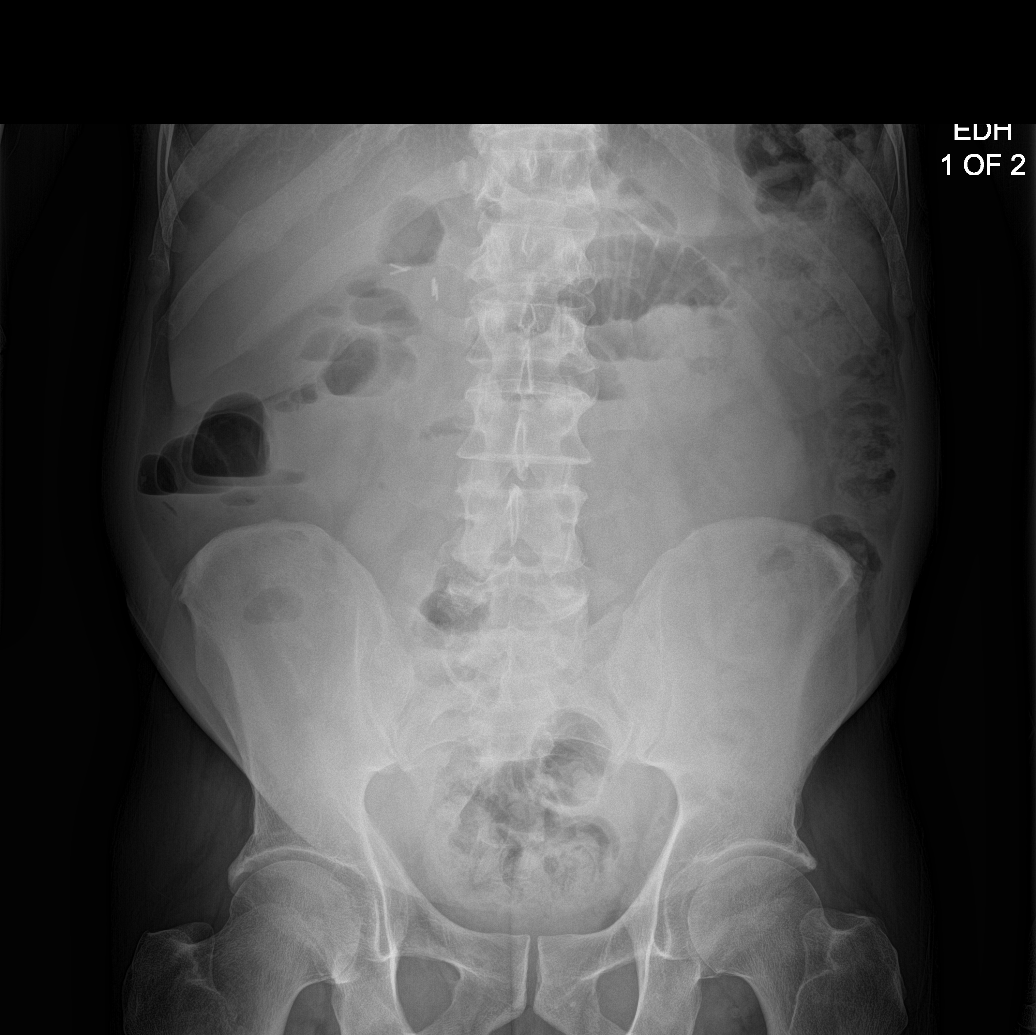
[im 2/4]
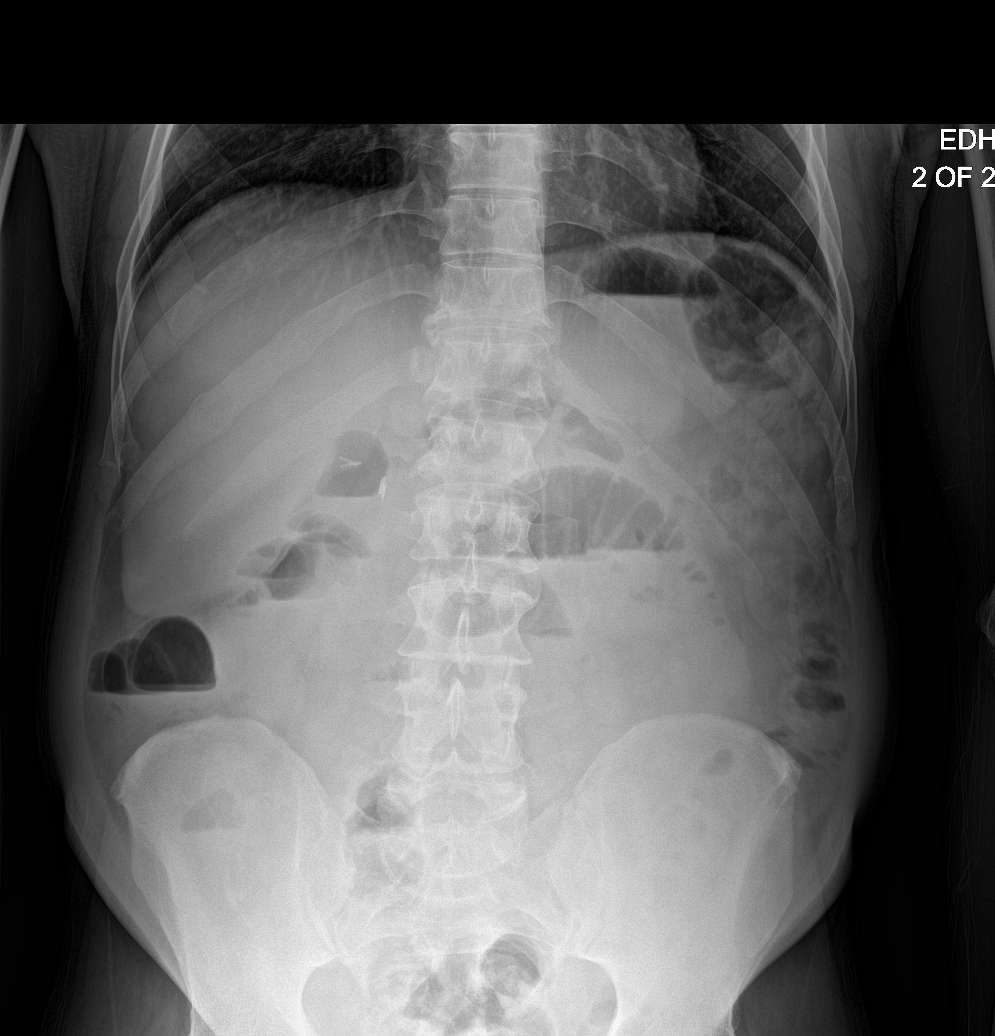
[im 3/4]
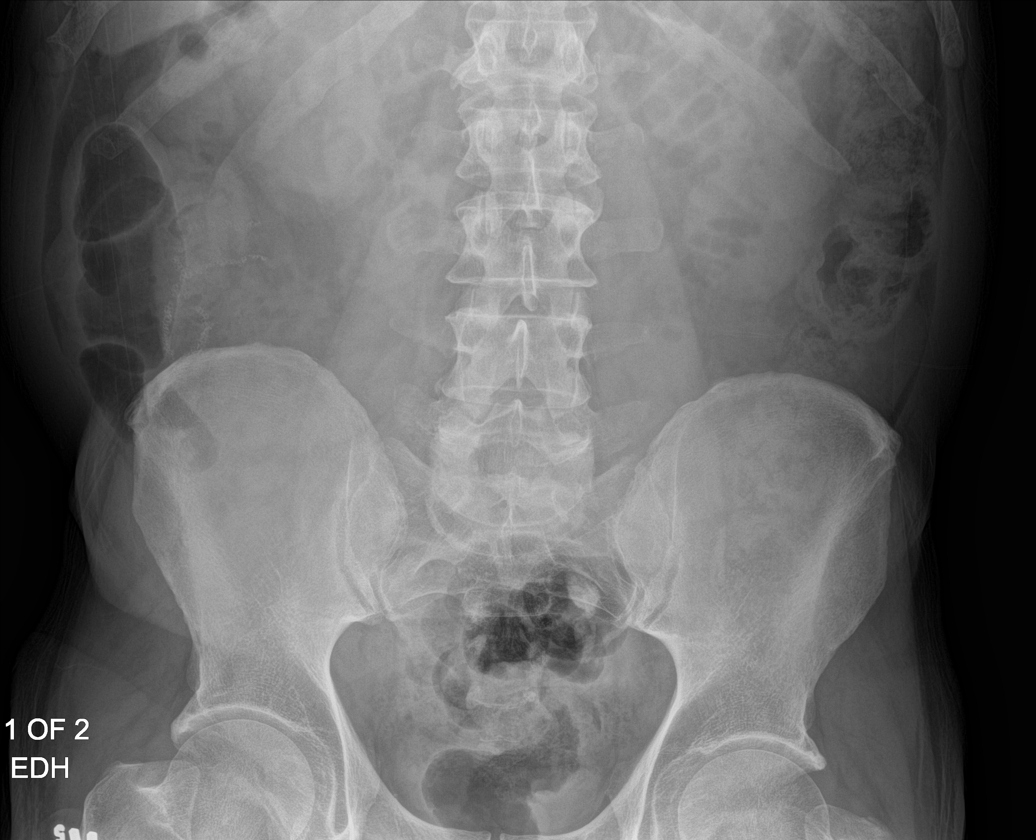
[im 4/4]
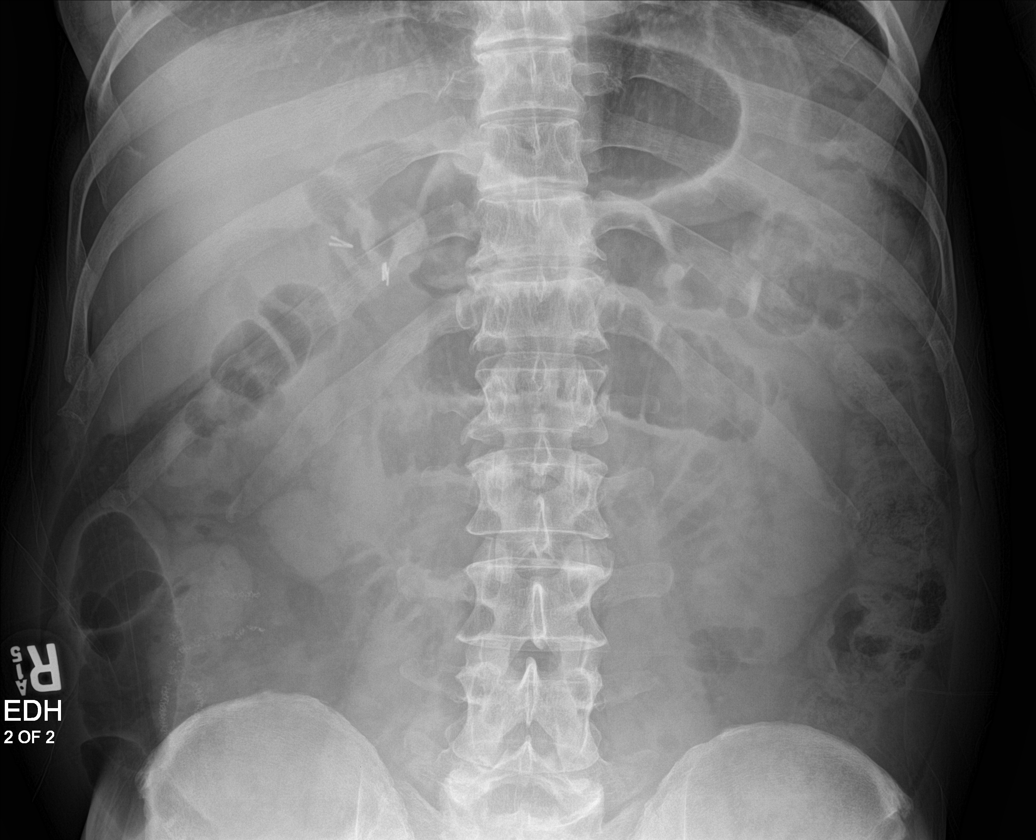

[4 of 4 positions shown; findings below may reference images not displayed]

FINDINGS: Dilated loop of small bowel centrally within the abdomen measuring
3.4 cm. Several short air-fluid levels in the central abdomen and
RIGHT lower quadrant. There is gas and stool in the descending colon
and rectosigmoid colon. Surgical clips in the RIGHT lower quadrant.

Prominent fat along the inferior margin of the RIGHT hepatic lobe
along the RIGHT pericolic gutter is not changed from comparison
exams. No evidence of free air beneath the diaphragms.
IMPRESSION: 1. Findings suggest focal small bowel ileus versus early small bowel
obstruction.
2. No evidence of high-grade obstruction or intraperitoneal free
air.

## 2018-07-05 ENCOUNTER — Other Ambulatory Visit: Payer: Self-pay | Admitting: Nurse Practitioner

## 2018-07-05 DIAGNOSIS — E119 Type 2 diabetes mellitus without complications: Secondary | ICD-10-CM

## 2018-07-05 DIAGNOSIS — E7849 Other hyperlipidemia: Secondary | ICD-10-CM

## 2018-07-05 DIAGNOSIS — I10 Essential (primary) hypertension: Secondary | ICD-10-CM

## 2018-07-05 NOTE — Addendum Note (Signed)
Addended by: Vernard Gambles on: 07/05/2018 04:13 PM   Modules accepted: Orders

## 2018-07-09 ENCOUNTER — Ambulatory Visit: Payer: Self-pay | Admitting: Nurse Practitioner

## 2018-07-09 ENCOUNTER — Ambulatory Visit: Payer: BLUE CROSS/BLUE SHIELD | Admitting: Nurse Practitioner

## 2018-07-28 ENCOUNTER — Observation Stay
Admission: EM | Admit: 2018-07-28 | Discharge: 2018-07-29 | Disposition: A | Discharge status: Home / Self Care | Payer: Worker's Compensation | Attending: Vascular Surgery | Admitting: Vascular Surgery

## 2018-07-28 ENCOUNTER — Emergency Department: Payer: Worker's Compensation | Admitting: Registered Nurse

## 2018-07-28 ENCOUNTER — Other Ambulatory Visit: Payer: Self-pay

## 2018-07-28 ENCOUNTER — Encounter: Payer: Self-pay | Admitting: Emergency Medicine

## 2018-07-28 ENCOUNTER — Encounter
Admission: EM | Disposition: A | Discharge status: Home / Self Care | Payer: Self-pay | Source: Home / Self Care | Attending: Emergency Medicine

## 2018-07-28 DIAGNOSIS — Z7984 Long term (current) use of oral hypoglycemic drugs: Secondary | ICD-10-CM | POA: Diagnosis not present

## 2018-07-28 DIAGNOSIS — Z79899 Other long term (current) drug therapy: Secondary | ICD-10-CM | POA: Diagnosis not present

## 2018-07-28 DIAGNOSIS — W268XXA Contact with other sharp object(s), not elsewhere classified, initial encounter: Secondary | ICD-10-CM | POA: Insufficient documentation

## 2018-07-28 DIAGNOSIS — S51819A Laceration without foreign body of unspecified forearm, initial encounter: Secondary | ICD-10-CM

## 2018-07-28 DIAGNOSIS — S51812A Laceration without foreign body of left forearm, initial encounter: Principal | ICD-10-CM | POA: Insufficient documentation

## 2018-07-28 DIAGNOSIS — E119 Type 2 diabetes mellitus without complications: Secondary | ICD-10-CM | POA: Diagnosis not present

## 2018-07-28 DIAGNOSIS — Y990 Civilian activity done for income or pay: Secondary | ICD-10-CM | POA: Insufficient documentation

## 2018-07-28 DIAGNOSIS — S55102D Unspecified injury of radial artery at forearm level, left arm, subsequent encounter: Secondary | ICD-10-CM | POA: Diagnosis present

## 2018-07-28 DIAGNOSIS — S55112A Laceration of radial artery at forearm level, left arm, initial encounter: Secondary | ICD-10-CM | POA: Diagnosis not present

## 2018-07-28 DIAGNOSIS — S55912A Laceration of unspecified blood vessel at forearm level, left arm, initial encounter: Secondary | ICD-10-CM | POA: Diagnosis not present

## 2018-07-28 DIAGNOSIS — I10 Essential (primary) hypertension: Secondary | ICD-10-CM | POA: Insufficient documentation

## 2018-07-28 HISTORY — PX: VEIN REPAIR: SHX6524

## 2018-07-28 LAB — COMPREHENSIVE METABOLIC PANEL
ALT: 62 U/L — ABNORMAL HIGH (ref 0–44)
AST: 52 U/L — ABNORMAL HIGH (ref 15–41)
Albumin: 4.4 g/dL (ref 3.5–5.0)
Alkaline Phosphatase: 52 U/L (ref 38–126)
Anion gap: 15 (ref 5–15)
BUN: 13 mg/dL (ref 6–20)
CO2: 23 mmol/L (ref 22–32)
Calcium: 9 mg/dL (ref 8.9–10.3)
Chloride: 94 mmol/L — ABNORMAL LOW (ref 98–111)
Creatinine, Ser: 0.82 mg/dL (ref 0.61–1.24)
GFR calc Af Amer: >60 mL/min (ref >60)
GFR calc non Af Amer: >60 mL/min (ref >60)
Glucose, Bld: 338 mg/dL — ABNORMAL HIGH (ref 70–99)
Potassium: 3.8 mmol/L (ref 3.5–5.1)
Sodium: 132 mmol/L — ABNORMAL LOW (ref 135–145)
Total Bilirubin: 0.9 mg/dL (ref 0.3–1.2)
Total Protein: 7.4 g/dL (ref 6.5–8.1)

## 2018-07-28 LAB — CBC WITH DIFFERENTIAL/PLATELET
Abs Immature Granulocytes: 0.03 K/uL (ref 0.00–0.07)
Basophils Absolute: 0.1 K/uL (ref 0.0–0.1)
Basophils Relative: 1 %
Eosinophils Absolute: 0.1 K/uL (ref 0.0–0.5)
Eosinophils Relative: 2 %
HCT: 44.6 % (ref 39.0–52.0)
Hemoglobin: 15.1 g/dL (ref 13.0–17.0)
Immature Granulocytes: 0 %
Lymphocytes Relative: 22 %
Lymphs Abs: 1.8 K/uL (ref 0.7–4.0)
MCH: 30.5 pg (ref 26.0–34.0)
MCHC: 33.9 g/dL (ref 30.0–36.0)
MCV: 90.1 fL (ref 80.0–100.0)
Monocytes Absolute: 0.5 K/uL (ref 0.1–1.0)
Monocytes Relative: 6 %
Neutro Abs: 5.7 K/uL (ref 1.7–7.7)
Neutrophils Relative %: 69 %
Platelets: 212 K/uL (ref 150–400)
RBC: 4.95 MIL/uL (ref 4.22–5.81)
RDW: 12.1 % (ref 11.5–15.5)
WBC: 8.1 K/uL (ref 4.0–10.5)
nRBC: 0 % (ref 0.0–0.2)

## 2018-07-28 LAB — URINE DRUG SCREEN, QUALITATIVE (ARMC ONLY)
Amphetamines, Ur Screen: NOT DETECTED
Barbiturates, Ur Screen: NOT DETECTED
Benzodiazepine, Ur Scrn: NOT DETECTED
Cannabinoid 50 Ng, Ur ~~LOC~~: NOT DETECTED
Cocaine Metabolite,Ur ~~LOC~~: NOT DETECTED
MDMA (Ecstasy)Ur Screen: NOT DETECTED
Methadone Scn, Ur: NOT DETECTED
Opiate, Ur Screen: POSITIVE — AB
Phencyclidine (PCP) Ur S: NOT DETECTED
Tricyclic, Ur Screen: NOT DETECTED

## 2018-07-28 LAB — GLUCOSE, CAPILLARY
Glucose-Capillary: 271 mg/dL — ABNORMAL HIGH (ref 70–99)
Glucose-Capillary: 209 mg/dL — ABNORMAL HIGH (ref 70–99)
Glucose-Capillary: 253 mg/dL — ABNORMAL HIGH (ref 70–99)

## 2018-07-28 LAB — APTT: aPTT: 26 seconds (ref 24–36)

## 2018-07-28 LAB — TYPE AND SCREEN
ABO/RH(D): A POS
Antibody Screen: NEGATIVE

## 2018-07-28 LAB — PROTIME-INR
INR: 1.1 (ref 0.8–1.2)
Prothrombin Time: 14 seconds (ref 11.4–15.2)

## 2018-07-28 SURGERY — VEIN REPAIR
Anesthesia: General | Site: Arm Lower | Laterality: Left

## 2018-07-28 MED ORDER — DOCUSATE SODIUM 100 MG PO CAPS
100.0000 mg | ORAL_CAPSULE | Freq: Two times a day (BID) | ORAL | Status: DC
  Administered 2018-07-28 – 2018-07-29 (×2): 100 mg via ORAL
  Filled 2018-07-28 (×2): qty 1

## 2018-07-28 MED ORDER — MORPHINE SULFATE (PF) 4 MG/ML IV SOLN
4.0000 mg | Freq: Once | INTRAVENOUS | Status: AC
  Administered 2018-07-28: 11:00:00 4 mg via INTRAVENOUS

## 2018-07-28 MED ORDER — PHENOL 1.4 % MT LIQD
1.0000 | OROMUCOSAL | Status: DC | PRN
  Administered 2018-07-28: 1 via OROMUCOSAL
  Filled 2018-07-28: qty 177

## 2018-07-28 MED ORDER — ONDANSETRON HCL 4 MG/2ML IJ SOLN
INTRAMUSCULAR | Status: AC
  Administered 2018-07-28: 11:00:00 4 mg via INTRAVENOUS
  Filled 2018-07-28: qty 2

## 2018-07-28 MED ORDER — CEFAZOLIN SODIUM-DEXTROSE 2-3 GM-%(50ML) IV SOLR
INTRAVENOUS | Status: DC | PRN
  Administered 2018-07-28: 2 g via INTRAVENOUS

## 2018-07-28 MED ORDER — PHENYLEPHRINE HCL (PRESSORS) 10 MG/ML IV SOLN
INTRAVENOUS | Status: AC
  Filled 2018-07-28: qty 1

## 2018-07-28 MED ORDER — LISINOPRIL 10 MG PO TABS
10.0000 mg | ORAL_TABLET | Freq: Every day | ORAL | Status: DC
  Administered 2018-07-28: 10 mg via ORAL
  Filled 2018-07-28: qty 1

## 2018-07-28 MED ORDER — LIDOCAINE HCL (PF) 2 % IJ SOLN
INTRAMUSCULAR | Status: AC
  Filled 2018-07-28: qty 10

## 2018-07-28 MED ORDER — FENTANYL CITRATE (PF) 100 MCG/2ML IJ SOLN: 25.0000 ug | INTRAMUSCULAR | Status: DC | PRN

## 2018-07-28 MED ORDER — INSULIN ASPART 100 UNIT/ML ~~LOC~~ SOLN
0.0000 [IU] | Freq: Three times a day (TID) | SUBCUTANEOUS | Status: DC
  Administered 2018-07-28: 17:00:00 5 [IU] via SUBCUTANEOUS
  Administered 2018-07-29 (×2): 2 [IU] via SUBCUTANEOUS
  Filled 2018-07-28 (×2): qty 1

## 2018-07-28 MED ORDER — ONDANSETRON HCL 4 MG/2ML IJ SOLN
4.0000 mg | Freq: Once | INTRAMUSCULAR | Status: AC
  Administered 2018-07-28: 11:00:00 4 mg via INTRAVENOUS

## 2018-07-28 MED ORDER — PROPOFOL 10 MG/ML IV BOLUS
INTRAVENOUS | Status: DC | PRN
  Administered 2018-07-28: 150 mg via INTRAVENOUS

## 2018-07-28 MED ORDER — EPHEDRINE SULFATE 50 MG/ML IJ SOLN
INTRAMUSCULAR | Status: DC | PRN
  Administered 2018-07-28 (×2): 10 mg via INTRAVENOUS

## 2018-07-28 MED ORDER — HEPARIN SODIUM (PORCINE) 1000 UNIT/ML IJ SOLN
INTRAMUSCULAR | Status: DC | PRN
  Administered 2018-07-28: 3000 [IU] via INTRAVENOUS

## 2018-07-28 MED ORDER — CEFAZOLIN SODIUM-DEXTROSE 1-4 GM/50ML-% IV SOLN
1.0000 g | Freq: Four times a day (QID) | INTRAVENOUS | Status: AC
  Administered 2018-07-28 – 2018-07-29 (×4): 1 g via INTRAVENOUS
  Filled 2018-07-28 (×4): qty 50

## 2018-07-28 MED ORDER — HYDROCODONE-ACETAMINOPHEN 5-325 MG PO TABS
1.0000 | ORAL_TABLET | ORAL | Status: DC | PRN
  Administered 2018-07-28: 2 via ORAL
  Administered 2018-07-29: 1 via ORAL
  Filled 2018-07-28 (×2): qty 2

## 2018-07-28 MED ORDER — FENTANYL CITRATE (PF) 100 MCG/2ML IJ SOLN
INTRAMUSCULAR | Status: DC | PRN
  Administered 2018-07-28 (×2): 50 ug via INTRAVENOUS

## 2018-07-28 MED ORDER — CEFAZOLIN SODIUM 1 G IJ SOLR
INTRAMUSCULAR | Status: AC
  Filled 2018-07-28: qty 20

## 2018-07-28 MED ORDER — FENTANYL CITRATE (PF) 100 MCG/2ML IJ SOLN
INTRAMUSCULAR | Status: AC
  Filled 2018-07-28: qty 2

## 2018-07-28 MED ORDER — OXYCODONE HCL 5 MG PO TABS: 5.0000 mg | ORAL_TABLET | Freq: Once | ORAL | Status: DC | PRN

## 2018-07-28 MED ORDER — DEXTROSE-NACL 5-0.9 % IV SOLN
INTRAVENOUS | Status: DC
  Administered 2018-07-28: 17:00:00 via INTRAVENOUS

## 2018-07-28 MED ORDER — SODIUM CHLORIDE 0.9 % IV SOLN
INTRAVENOUS | Status: DC | PRN
  Administered 2018-07-28: 500 mL via INTRAMUSCULAR

## 2018-07-28 MED ORDER — ACETAMINOPHEN 650 MG RE SUPP: 650.0000 mg | Freq: Four times a day (QID) | RECTAL | Status: DC | PRN

## 2018-07-28 MED ORDER — TETANUS-DIPHTH-ACELL PERTUSSIS 5-2.5-18.5 LF-MCG/0.5 IM SUSP
0.5000 mL | Freq: Once | INTRAMUSCULAR | Status: AC
  Administered 2018-07-28: 11:00:00 0.5 mL via INTRAMUSCULAR
  Filled 2018-07-28: qty 0.5

## 2018-07-28 MED ORDER — GLYCOPYRROLATE 0.2 MG/ML IJ SOLN
INTRAMUSCULAR | Status: AC
  Filled 2018-07-28: qty 1

## 2018-07-28 MED ORDER — MAGNESIUM HYDROXIDE 400 MG/5ML PO SUSP: 30.0000 mL | Freq: Every day | ORAL | Status: DC | PRN

## 2018-07-28 MED ORDER — BUPIVACAINE-EPINEPHRINE (PF) 0.5% -1:200000 IJ SOLN
INTRAMUSCULAR | Status: DC | PRN
  Administered 2018-07-28: 8 mL via PERINEURAL

## 2018-07-28 MED ORDER — MORPHINE SULFATE (PF) 4 MG/ML IV SOLN
INTRAVENOUS | Status: AC
  Administered 2018-07-28: 4 mg via INTRAVENOUS
  Filled 2018-07-28: qty 1

## 2018-07-28 MED ORDER — FLEET ENEMA 7-19 GM/118ML RE ENEM: 1.0000 | ENEMA | Freq: Once | RECTAL | Status: DC | PRN

## 2018-07-28 MED ORDER — DEXAMETHASONE SODIUM PHOSPHATE 10 MG/ML IJ SOLN
INTRAMUSCULAR | Status: DC | PRN
  Administered 2018-07-28: 4 mg via INTRAVENOUS

## 2018-07-28 MED ORDER — SODIUM CHLORIDE 0.9 % IV SOLN
INTRAVENOUS | Status: DC
  Administered 2018-07-28: 11:00:00 via INTRAVENOUS

## 2018-07-28 MED ORDER — ATORVASTATIN CALCIUM 20 MG PO TABS
20.0000 mg | ORAL_TABLET | Freq: Every day | ORAL | Status: DC
  Administered 2018-07-28: 22:00:00 20 mg via ORAL
  Filled 2018-07-28: qty 1

## 2018-07-28 MED ORDER — ASPIRIN EC 81 MG PO TBEC
81.0000 mg | DELAYED_RELEASE_TABLET | Freq: Every day | ORAL | Status: DC
  Administered 2018-07-28 – 2018-07-29 (×2): 81 mg via ORAL
  Filled 2018-07-28 (×2): qty 1

## 2018-07-28 MED ORDER — ONDANSETRON HCL 4 MG/2ML IJ SOLN
INTRAMUSCULAR | Status: AC
  Filled 2018-07-28: qty 2

## 2018-07-28 MED ORDER — ONDANSETRON HCL 4 MG/2ML IJ SOLN
INTRAMUSCULAR | Status: DC | PRN
  Administered 2018-07-28: 4 mg via INTRAVENOUS

## 2018-07-28 MED ORDER — METFORMIN HCL 500 MG PO TABS
500.0000 mg | ORAL_TABLET | Freq: Every day | ORAL | Status: DC
  Administered 2018-07-28: 500 mg via ORAL
  Filled 2018-07-28: qty 1

## 2018-07-28 MED ORDER — DEXAMETHASONE SODIUM PHOSPHATE 4 MG/ML IJ SOLN
INTRAMUSCULAR | Status: AC
  Filled 2018-07-28: qty 1

## 2018-07-28 MED ORDER — ONDANSETRON HCL 4 MG/2ML IJ SOLN: 4.0000 mg | Freq: Four times a day (QID) | INTRAMUSCULAR | Status: DC | PRN

## 2018-07-28 MED ORDER — SUCCINYLCHOLINE CHLORIDE 20 MG/ML IJ SOLN
INTRAMUSCULAR | Status: AC
  Filled 2018-07-28: qty 1

## 2018-07-28 MED ORDER — INSULIN ASPART 100 UNIT/ML ~~LOC~~ SOLN
0.0000 [IU] | Freq: Every day | SUBCUTANEOUS | Status: DC
  Administered 2018-07-28: 2 [IU] via SUBCUTANEOUS
  Filled 2018-07-28: qty 1

## 2018-07-28 MED ORDER — EPHEDRINE SULFATE 50 MG/ML IJ SOLN
INTRAMUSCULAR | Status: AC
  Filled 2018-07-28: qty 1

## 2018-07-28 MED ORDER — ACETAMINOPHEN 325 MG PO TABS: 650.0000 mg | ORAL_TABLET | Freq: Four times a day (QID) | ORAL | Status: DC | PRN

## 2018-07-28 MED ORDER — GLYCOPYRROLATE 0.2 MG/ML IJ SOLN
INTRAMUSCULAR | Status: DC | PRN
  Administered 2018-07-28: 0.2 mg via INTRAVENOUS

## 2018-07-28 MED ORDER — SUCCINYLCHOLINE CHLORIDE 20 MG/ML IJ SOLN
INTRAMUSCULAR | Status: DC | PRN
  Administered 2018-07-28: 100 mg via INTRAVENOUS

## 2018-07-28 MED ORDER — PHENYLEPHRINE HCL (PRESSORS) 10 MG/ML IV SOLN
INTRAVENOUS | Status: DC | PRN
  Administered 2018-07-28 (×4): 200 ug via INTRAVENOUS
  Administered 2018-07-28: 100 ug via INTRAVENOUS
  Administered 2018-07-28 (×2): 200 ug via INTRAVENOUS

## 2018-07-28 MED ORDER — ONDANSETRON HCL 4 MG PO TABS: 4.0000 mg | ORAL_TABLET | Freq: Four times a day (QID) | ORAL | Status: DC | PRN

## 2018-07-28 MED ORDER — HEPARIN SODIUM (PORCINE) 1000 UNIT/ML IJ SOLN
INTRAMUSCULAR | Status: AC
  Filled 2018-07-28: qty 1

## 2018-07-28 MED ORDER — OXYCODONE HCL 5 MG/5ML PO SOLN: 5.0000 mg | Freq: Once | ORAL | Status: DC | PRN

## 2018-07-28 MED ORDER — LIDOCAINE HCL (CARDIAC) PF 100 MG/5ML IV SOSY
PREFILLED_SYRINGE | INTRAVENOUS | Status: DC | PRN
  Administered 2018-07-28: 50 mg via INTRAVENOUS

## 2018-07-28 MED ORDER — SORBITOL 70 % SOLN: 30.0000 mL | Freq: Every day | Status: DC | PRN

## 2018-07-28 MED ORDER — PROPOFOL 10 MG/ML IV BOLUS
INTRAVENOUS | Status: AC
  Filled 2018-07-28: qty 20

## 2018-07-28 SURGICAL SUPPLY — 54 items
BAG DECANTER FOR FLEXI CONT (MISCELLANEOUS) ×2 IMPLANT
BLADE SURG SZ11 CARB STEEL (BLADE) ×2 IMPLANT
BOOT SUTURE AID YELLOW STND (SUTURE) ×4 IMPLANT
BRUSH SCRUB EZ  4% CHG (MISCELLANEOUS)
BRUSH SCRUB EZ 4% CHG (MISCELLANEOUS) IMPLANT
CANISTER SUCT 1200ML W/VALVE (MISCELLANEOUS) ×2 IMPLANT
CHLORAPREP W/TINT 26 (MISCELLANEOUS) IMPLANT
CLIP SPRNG 6MM S-JAW DBL (CLIP) ×2
COVER WAND RF STERILE (DRAPES) ×2 IMPLANT
DERMABOND ADVANCED (GAUZE/BANDAGES/DRESSINGS) ×1
DERMABOND ADVANCED .7 DNX12 (GAUZE/BANDAGES/DRESSINGS) ×1 IMPLANT
DRSG OPSITE POSTOP 4X6 (GAUZE/BANDAGES/DRESSINGS) ×2 IMPLANT
ELECT CAUTERY BLADE 6.4 (BLADE) ×2 IMPLANT
ELECT REM PT RETURN 9FT ADLT (ELECTROSURGICAL) ×2
ELECTRODE REM PT RTRN 9FT ADLT (ELECTROSURGICAL) ×1 IMPLANT
GAUZE XEROFORM 1X8 LF (GAUZE/BANDAGES/DRESSINGS) ×2 IMPLANT
GEL ULTRASOUND 20GR AQUASONIC (MISCELLANEOUS) IMPLANT
GLOVE BIO SURGEON STRL SZ7 (GLOVE) ×4 IMPLANT
GLOVE INDICATOR 7.5 STRL GRN (GLOVE) ×2 IMPLANT
GOWN STRL REUS W/ TWL LRG LVL3 (GOWN DISPOSABLE) ×2 IMPLANT
GOWN STRL REUS W/ TWL XL LVL3 (GOWN DISPOSABLE) ×1 IMPLANT
GOWN STRL REUS W/TWL LRG LVL3 (GOWN DISPOSABLE) ×2
GOWN STRL REUS W/TWL XL LVL3 (GOWN DISPOSABLE) ×1
HEMOSTAT SURGICEL 2X3 (HEMOSTASIS) ×2 IMPLANT
IV NS 500ML (IV SOLUTION) ×1
IV NS 500ML BAXH (IV SOLUTION) ×1 IMPLANT
KIT TURNOVER KIT A (KITS) ×2 IMPLANT
LABEL OR SOLS (LABEL) ×2 IMPLANT
LOOP RED MAXI  1X406MM (MISCELLANEOUS) ×1
LOOP VESSEL MAXI 1X406 RED (MISCELLANEOUS) ×1 IMPLANT
LOOP VESSEL MINI 0.8X406 BLUE (MISCELLANEOUS) ×1 IMPLANT
LOOPS BLUE MINI 0.8X406MM (MISCELLANEOUS) ×1
NEEDLE FILTER BLUNT 18X 1/2SAF (NEEDLE) ×1
NEEDLE FILTER BLUNT 18X1 1/2 (NEEDLE) ×1 IMPLANT
NS IRRIG 500ML POUR BTL (IV SOLUTION) ×2 IMPLANT
PACK EXTREMITY ARMC (MISCELLANEOUS) ×2 IMPLANT
PAD PREP 24X41 OB/GYN DISP (PERSONAL CARE ITEMS) ×2 IMPLANT
SOL PREP PROV IODINE SCRUB 4OZ (MISCELLANEOUS) ×2 IMPLANT
SOLUTION CELL SAVER (CLIP) ×1 IMPLANT
STAPLER SKIN PROX 35W (STAPLE) ×2 IMPLANT
STOCKINETTE STRL 4IN 9604848 (GAUZE/BANDAGES/DRESSINGS) ×2 IMPLANT
SUT MNCRL AB 4-0 PS2 18 (SUTURE) ×2 IMPLANT
SUT PROLENE 6 0 BV (SUTURE) ×26 IMPLANT
SUT SILK 2 0 (SUTURE) ×1
SUT SILK 2-0 18XBRD TIE 12 (SUTURE) ×1 IMPLANT
SUT SILK 3 0 (SUTURE) ×1
SUT SILK 3-0 18XBRD TIE 12 (SUTURE) ×1 IMPLANT
SUT SILK 4 0 (SUTURE) ×1
SUT SILK 4-0 18XBRD TIE 12 (SUTURE) ×1 IMPLANT
SUT VIC AB 3-0 SH 27 (SUTURE) ×1
SUT VIC AB 3-0 SH 27X BRD (SUTURE) ×1 IMPLANT
SYR 20CC LL (SYRINGE) ×2 IMPLANT
SYR 3ML LL SCALE MARK (SYRINGE) ×2 IMPLANT
SYR TB 1ML 27GX1/2 LL (SYRINGE) IMPLANT

## 2018-07-28 NOTE — Progress Notes (Signed)
Inpatient Diabetes Program Recommendations  AACE/ADA: New Consensus Statement on Inpatient Glycemic Control (2015)  Target Ranges:  Prepandial:   less than 140 mg/dL      Peak postprandial:   less than 180 mg/dL (1-2 hours)      Critically ill patients:  140 - 180 mg/dL   Results for BRANON, PETROSINO (MRN [id number]) as of 07/28/2018 13:41  Ref. Range 07/28/2018 11:19 07/28/2018 12:32  Glucose-Capillary Latest Ref Range: 70 - 99 mg/dL 500 (H) 370 (H)    To ED with Laceration to the left radial artery--Taken Immediately to the OR for repair  History: DM  Home DM Meds: Metformin 500 mg Daily (dose reduced back in Sept 2019 at PCP visit b/c A1c was down to 5.9% per notes--Was 500 mg BID prior)  Current Orders: Metformin 500 mg Daily    Taken to the OR this AM.   Got 4 mg Decadron X 1 dose at 11:30am today.    MD- Please consider adding orders for Novolog Sensitive Correction Scale/ SSI (0-9 units) TID AC + HS during hospitalization.  Thanks!   --Will follow patient during hospitalization--  Ambrose Finland RN, MSN, CDE Diabetes Coordinator Inpatient Glycemic Control Team Team Pager: [phone] (8a-5p)

## 2018-07-28 NOTE — ED Notes (Signed)
ED Provider Williams  at bedside to update pt.

## 2018-07-28 NOTE — Transfer of Care (Signed)
Immediate Anesthesia Transfer of Care Note  Patient: Scott [name]  Procedure(s) Performed: RADIAL ARTERY LIGATION LEFT (Left Arm Lower)  Patient Location: PACU  Anesthesia Type:General  Level of Consciousness: awake  Airway & Oxygen Therapy: Patient Spontanous Breathing and Patient connected to face mask oxygen  Post-op Assessment: Report given to RN and Post -op Vital signs reviewed and stable  Post vital signs: Reviewed  Last Vitals:  Vitals Value Taken Time  BP 102/65 07/28/2018 12:30 PM  Temp    Pulse 114 07/28/2018 12:30 PM  Resp 11 07/28/2018 12:30 PM  SpO2 97 % 07/28/2018 12:30 PM  Vitals shown include unvalidated device data.  Last Pain:  Vitals:   07/28/18 1050  TempSrc:   PainSc: 6          Complications: No apparent anesthesia complications

## 2018-07-28 NOTE — Op Note (Addendum)
[facility]   OPERATIVE NOTE  DATE: 07/28/2018  PRE-OPERATIVE DIAGNOSIS: Left radial artery injury with active arterial bleeding  POST-OPERATIVE DIAGNOSIS: same as above  PROCEDURE: 1.   Left forearm exploration, repair/ligation of radial artery  SURGEON: Festus BarrenJason , MD  ASSISTANT(S): Raul DelKim Stegmayer, PA-C  ANESTHESIA: general  ESTIMATED BLOOD LOSS: 50 cc  FINDING(S): 1.  The anterior wall of the radial artery was injured in multiple locations making primary repair difficult/impossible.  There was excellent triphasic flow through the ulnar artery into the hand and retrograde into the radial artery distal to the injury providing good perfusion to the hand.  SPECIMEN(S):  None  INDICATIONS:   Patient is a 58 y.o.male who presents with active arterial bleeding from the radial artery on the left forearm after an injury at work today.  He had squirting arterial blood that can only be controlled with direct pressure prior to coming to the operating room.  Emergency surgery is required to control the bleeding.  Risks and benefits are discussed. An assistant was present during the procedure to help facilitate the exposure and expedite the procedure.   DESCRIPTION: After obtaining full informed written consent, the patient was brought back to the operating room and placed supine upon the operating table.  The patient received IV antibiotics prior to incision.  After obtaining adequate anesthesia, the patient was prepped and draped in the standard fashion. The assistant provided retraction and mobilization to help facilitate exposure and expedite the procedure throughout the entire procedure.  This included following suture, using retractors, and optimizing lighting. I extended the open skin wound proximally and distally and on evaluation immediately saw what appeared to be a relatively small rent in the radial artery.  As the dissection continued, there was a second hole in  the radial artery that was significantly larger and more irregular.  The backwall of the radial artery was intact.  We dissected out proximally and distally to gain control and there was both forward bleeding and backbleeding present.  A small dose of intravenous heparin were then given and control was pulled up on the radial artery proximal and distal to the injuries with Vesseloops.  The small rent was easily repaired with two 6-0 Prolene sutures and was hemostatic.  The larger rent was freshened up to get the edges of the artery that appeared viable.  A series of interrupted 6-0 Prolene sutures were then performed but with each stitch the anterior wall of the artery would bleed proximal or distal to the repair.  Several additional 6-0 Prolene sutures were used to repair these areas with continued oozing and clear narrowing of the lumen that would not maintain patency.  At this point, I elected to ligate the artery proximally and distally after interrogating the ulnar artery.  The ulnar artery had brisk triphasic flow.  The hand was pink with good capillary refill.  There was good triphasic flow in the hand through the palmar arch and into the radial artery distal to the lesion at the wrist showing retrograde flow through the palmar arch.  It was clear his hand perfusion was well-maintained and ligation of the radial artery would be safe.  2-0 silk ties were used to ligate the radial artery proximally and distally as well as a 3-0 silk tie on a medial side branch.  The wound was then irrigated.  Some of the hematoma that was present in the tissue was removed and expressed.  The subcutaneous tissue was then closed  with a running 3-0 Vicryl.  Staples were placed in the skin.  The patient had no further bleeding and a warm well-perfused hand.  I elected to terminate the procedure.  Sterile dressing was placed. The patient was taken to the recovery room in stable condition having tolerated the procedure  well.  COMPLICATIONS: None  CONDITION: Stable   Festus Barren 07/28/2018 12:41 PM   This note was created with Dragon Medical transcription system. Any errors in dictation are purely unintentional.

## 2018-07-28 NOTE — Anesthesia Procedure Notes (Signed)
Procedure Name: Intubation Performed by: Mathews Argyle, CRNA Pre-anesthesia Checklist: Patient identified, Patient being monitored, Timeout performed, Emergency Drugs available and Suction available Patient Re-evaluated:Patient Re-evaluated prior to induction Oxygen Delivery Method: Circle system utilized Preoxygenation: Pre-oxygenation with 100% oxygen Induction Type: IV induction, Rapid sequence and Cricoid Pressure applied Laryngoscope Size: McGraph and 4 Grade View: Grade I Tube type: Oral Tube size: 7.5 mm Number of attempts: 1 Airway Equipment and Method: Stylet and Video-laryngoscopy Placement Confirmation: ETT inserted through vocal cords under direct vision,  positive ETCO2 and breath sounds checked- equal and bilateral Secured at: 21 cm Tube secured with: Tape Dental Injury: Teeth and Oropharynx as per pre-operative assessment

## 2018-07-28 NOTE — ED Notes (Signed)
OR  PA at bedside at this time.

## 2018-07-28 NOTE — Anesthesia Post-op Follow-up Note (Signed)
Anesthesia QCDR form completed.        

## 2018-07-28 NOTE — ED Triage Notes (Signed)
Pt in via POV, reports scissors going into left forearm, wound so bloody, unable to visualize severity.  Pt roomed at this time, EDP to bedside.

## 2018-07-28 NOTE — ED Provider Notes (Signed)
[facility] Emergency Department Provider Note       Time seen: ----------------------------------------- 10:24 AM on 07/28/2018 -----------------------------------------   I have reviewed the triage vital signs and the nursing notes.  HISTORY   Chief Complaint Laceration    HPI Scott [name] is a 58 y.o. male with a history of allergies, diabetes, kidney stones, hypertension who presents to the ED for laceration sustained to his left forearm.  Patient states he was at work and he reports scissors going into his left forearm vertically.  He was unable to visualize where the bleeding was coming from, he is concerned he may have struck a blood vessel.  He denies any other complaints.  Past Medical History:  Diagnosis Date  . Allergy   . Diabetes mellitus without complication (HCC)    pt. stated "I don't have diabetes", says last time he was in hospital the dx changed  . History of kidney stones    H/O STONES  . Hypertension     Patient Active Problem List   Diagnosis Date Noted  . Hypertension 10/29/2015  . Controlled diabetes mellitus type II without complication (HCC) 10/29/2015  . Onychomycosis 10/29/2015    Past Surgical History:  Procedure Laterality Date  . APPENDECTOMY    . CHOLECYSTECTOMY    . COLONOSCOPY WITH PROPOFOL N/A 06/24/2017   Procedure: COLONOSCOPY WITH PROPOFOL;  Surgeon: Earline Mayotte, MD;  Location: [facility] ENDOSCOPY;  Service: Endoscopy;  Laterality: N/A;  . LAPAROTOMY N/A 02/05/2016   Procedure: EXPLORATORY LAPAROTOMY FOR SMALL BOWEL OBSTRUCTION;  Surgeon: Kieth Brightly, MD;  Location: [facility] ORS;  Service: General;  Laterality: N/A;  . LYSIS OF ADHESION  02/05/2016   Procedure: LYSIS OF ADHESION;  Surgeon: Kieth Brightly, MD;  Location: [facility] ORS;  Service: General;;  . SMALL INTESTINE SURGERY  2005   Excision of Meckel's diverticulum    Allergies Patient has no known allergies.  Social  History Social History   Tobacco Use  . Smoking status: Never Smoker  . Smokeless tobacco: Never Used  Substance Use Topics  . Alcohol use: Yes    Alcohol/week: 0.0 standard drinks    Comment: occasional  . Drug use: No   Review of Systems Constitutional: Negative for fever. Musculoskeletal: Positive for left forearm pain Skin: Positive for left forearm laceration Neurological: Negative for headaches, focal weakness or numbness.  All systems negative/normal/unremarkable except as stated in the HPI  ____________________________________________   PHYSICAL EXAM:  VITAL SIGNS: ED Triage Vitals  Enc Vitals Group     BP 07/28/18 1019 (!) 137/99     Pulse Rate 07/28/18 1019 (!) 128     Resp --      Temp --      Temp src --      SpO2 07/28/18 1019 98 %     Weight 07/28/18 1015 165 lb (74.8 kg)     Height 07/28/18 1015 5\' 7"  (1.702 m)     Head Circumference --      Peak Flow --      Pain Score 07/28/18 1015 4     Pain Loc --      Pain Edu? --      Excl. in GC? --    Constitutional: Alert and oriented.  Anxious, no distress Eyes: Conjunctivae are normal. Normal extraocular movements. Cardiovascular: Rapid rate, regular rhythm. No murmurs, rubs, or gallops. Respiratory: Normal respiratory effort without tachypnea nor retractions. Breath sounds are clear and equal bilaterally. No wheezes/rales/rhonchi.  Musculoskeletal: Distal forearm laceration of 3 cm, overlying the radial artery Neurologic:  Normal speech and language. No gross focal neurologic deficits are appreciated.  Skin: Pulsatile laceration noted over the left distal forearm over the anterior aspect.  Pulsatile bleeding is noted concerning for vascular injury ____________________________________________  ED COURSE:  As part of my medical decision making, I reviewed the following data within the electronic MEDICAL RECORD NUMBER History obtained from family if available, nursing notes, old chart and ekg, as well as notes  from prior ED visits. Patient presented for left forearm laceration, we will assess with labs and imaging as indicated at this time.   Procedures  Scott [name] was evaluated in Emergency Department on 07/28/2018 for the symptoms described in the history of present illness. He was evaluated in the context of the global COVID-19 pandemic, which necessitated consideration that the patient might be at risk for infection with the SARS-CoV-2 virus that causes COVID-19. Institutional protocols and algorithms that pertain to the evaluation of patients at risk for COVID-19 are in a state of rapid change based on information released by regulatory bodies including the CDC and federal and state organizations. These policies and algorithms were followed during the patient's care in the ED.  ____________________________________________   LABS (pertinent positives/negatives)  Labs Reviewed  CBC WITH DIFFERENTIAL/PLATELET  COMPREHENSIVE METABOLIC PANEL  ____________________________________________   DIFFERENTIAL DIAGNOSIS   Laceration, vascular injury  FINAL ASSESSMENT AND PLAN  Left forearm laceration with arterial injury   Plan: The patient had presented for pulsatile laceration to the left distal forearm. Patient's labs are still pending at this time.  Using a blood pressure cuff I was able to achieve hemostasis.  However when the blood pressure cuff is let down there is obvious pulsatile bleeding.  I have consulted Dr. Wyn Quakerew for repair   Scott DashJohnathan [name] Aveah Castell, MD    Note: This note was generated in part or whole with voice recognition software. Voice recognition is usually quite accurate but there are transcription errors that can and very often do occur. I apologize for any typographical errors that were not detected and corrected.     Scott FilbertWilliams, Jamesia Linnen E, MD 07/28/18 1026

## 2018-07-28 NOTE — Anesthesia Preprocedure Evaluation (Signed)
Anesthesia Evaluation  Patient identified by MRN, date of birth, ID band Patient awake    Reviewed: Allergy & Precautions, H&P , NPO status , Patient's Chart, lab work & pertinent test results  History of Anesthesia Complications Negative for: history of anesthetic complications  Airway Mallampati: III  TM Distance: <3 FB Neck ROM: limited    Dental  (+) Chipped, Poor Dentition, Missing   Pulmonary neg pulmonary ROS, neg shortness of breath,           Cardiovascular Exercise Tolerance: Good hypertension, (-) angina(-) Past MI and (-) DOE      Neuro/Psych negative neurological ROS  negative psych ROS   GI/Hepatic negative GI ROS, Neg liver ROS, neg GERD  ,  Endo/Other  diabetes, Type 2, Insulin Dependent  Renal/GU      Musculoskeletal   Abdominal   Peds  Hematology negative hematology ROS (+)   Anesthesia Other Findings Past Medical History: No date: Allergy No date: Diabetes mellitus without complication (HCC)     Comment:  pt. stated "I don't have diabetes", says last time he               was in hospital the dx changed No date: History of kidney stones     Comment:  H/O STONES No date: Hypertension  Past Surgical History: No date: APPENDECTOMY No date: CHOLECYSTECTOMY 06/24/2017: COLONOSCOPY WITH PROPOFOL; N/A     Comment:  Procedure: COLONOSCOPY WITH PROPOFOL;  Surgeon: Earline Mayotte, MD;  Location: [facility] ENDOSCOPY;  Service:               Endoscopy;  Laterality: N/A; 02/05/2016: LAPAROTOMY; N/A     Comment:  Procedure: EXPLORATORY LAPAROTOMY FOR SMALL BOWEL               OBSTRUCTION;  Surgeon: Kieth Brightly, MD;                Location: [facility] ORS;  Service: General;  Laterality: N/A; 02/05/2016: LYSIS OF ADHESION     Comment:  Procedure: LYSIS OF ADHESION;  Surgeon: Kieth Brightly, MD;  Location: [facility] ORS;  Service: General;; 2005: SMALL INTESTINE  SURGERY     Comment:  Excision of Meckel's diverticulum  BMI    Body Mass Index:  25.84 kg/m      Reproductive/Obstetrics negative OB ROS                             Anesthesia Physical Anesthesia Plan  ASA: IV and emergent  Anesthesia Plan: General ETT   Post-op Pain Management:    Induction: Intravenous  PONV Risk Score and Plan: Ondansetron, Dexamethasone, Midazolam and Treatment may vary due to age or medical condition  Airway Management Planned: Oral ETT and Video Laryngoscope Planned  Additional Equipment:   Intra-op Plan:   Post-operative Plan: Extubation in OR  Informed Consent: I have reviewed the patients History and Physical, chart, labs and discussed the procedure including the risks, benefits and alternatives for the proposed anesthesia with the patient or authorized representative who has indicated his/her understanding and acceptance.     Dental Advisory Given  Plan Discussed with: Anesthesiologist, CRNA and Surgeon  Anesthesia Plan Comments: (Patient consented for risks of anesthesia including but not limited to:  - adverse reactions to medications -  damage to teeth, lips or other oral mucosa - sore throat or hoarseness - Damage to heart, brain, lungs or loss of life  Patient voiced understanding.)        Anesthesia Quick Evaluation  

## 2018-07-28 NOTE — H&P (Signed)
[facility] Vascular Consult Note  MRN : [id number]  Scott [name] is a 58 y.o. ([birth year]) male who presents with chief complaint of  Chief Complaint  Patient presents with  . Laceration   History of Present Illness:  The patient is a 58 year old male past medical history of hypertension, history of kidney stones, diabetes, environmental allergies who presents [facility] the [facility] with a chief complaint of laceration to left upper extremity.  Patient endorses a history of accidentally lacerating his lower forearm and noting refuse bleeding.  Did this profuse bleeding the patient sought medical attention in emergency department.  After examination by the emergency department physician he noted to have a laceration to the left radial artery.  Patient denies any other symptoms.  Patient denies any shortness of breath or chest pain.  Patient denies any fever, nausea vomiting.  Vascular surgery was consulted by the ED physician Dr. Mayford KnifeWilliams for surgical repair emergently. Current Facility-Administered Medications  Medication Dose Route Frequency Provider Last Rate Last Dose  . 0.9 %  sodium chloride infusion   Intravenous Continuous , Ranae PlumberKimberly A, PA-C       Current Outpatient Medications  Medication Sig Dispense Refill  . atorvastatin (LIPITOR) 20 MG tablet TAKE 1 TABLET(20 MG) BY MOUTH DAILY 30 tablet 0  . lisinopril (PRINIVIL,ZESTRIL) 10 MG tablet Take 1 tablet (10 mg total) by mouth daily. 90 tablet 1  . metFORMIN (GLUCOPHAGE) 500 MG tablet Take 1 tablet (500 mg total) by mouth daily with breakfast. 90 tablet 3   Past Medical History:  Diagnosis Date  . Allergy   . Diabetes mellitus without complication (HCC)    pt. stated "I don't have diabetes", says last time he was in hospital the dx changed  . History of kidney stones    H/O STONES  . Hypertension    Past Surgical History:  Procedure Laterality Date  . APPENDECTOMY     . CHOLECYSTECTOMY    . COLONOSCOPY WITH PROPOFOL N/A 06/24/2017   Procedure: COLONOSCOPY WITH PROPOFOL;  Surgeon: Earline MayotteByrnett, Jeffrey W, MD;  Location: [facility] ENDOSCOPY;  Service: Endoscopy;  Laterality: N/A;  . LAPAROTOMY N/A 02/05/2016   Procedure: EXPLORATORY LAPAROTOMY FOR SMALL BOWEL OBSTRUCTION;  Surgeon: Kieth BrightlySeeplaputhur G Sankar, MD;  Location: [facility] ORS;  Service: General;  Laterality: N/A;  . LYSIS OF ADHESION  02/05/2016   Procedure: LYSIS OF ADHESION;  Surgeon: Kieth BrightlySeeplaputhur G Sankar, MD;  Location: [facility] ORS;  Service: General;;  . SMALL INTESTINE SURGERY  2005   Excision of Meckel's diverticulum   Social History Social History   Tobacco Use  . Smoking status: Never Smoker  . Smokeless tobacco: Never Used  Substance Use Topics  . Alcohol use: Yes    Alcohol/week: 0.0 standard drinks    Comment: occasional  . Drug use: No   Family History Family History  Problem Relation Age of Onset  . Heart disease Mother   . Hypertension Mother   . Diabetes Mother   . Diabetes Sister   . Diabetes Brother   Denies any family history of peripheral artery disease, venous disease or bleeding/clotting disorders  No Known Allergies  REVIEW OF SYSTEMS (Negative unless checked)  Constitutional: [] Weight loss  [] Fever  [] Chills Cardiac: [] Chest pain   [] Chest pressure   [] Palpitations   [] Shortness of breath when laying flat   [] Shortness of breath at rest   [] Shortness of breath with exertion. Vascular:  [] Pain in legs with walking   [] Pain in  legs at rest   [] Pain in legs when laying flat   [] Claudication   [] Pain in feet when walking  [] Pain in feet at rest  [] Pain in feet when laying flat   [] History of DVT   [] Phlebitis   [] Swelling in legs   [] Varicose veins   [] Non-healing ulcers Pulmonary:   [] Uses home oxygen   [] Productive cough   [] Hemoptysis   [] Wheeze  [] COPD   [] Asthma Neurologic:  [] Dizziness  [] Blackouts   [] Seizures   [] History of stroke   [] History of TIA  [] Aphasia   [] Temporary  blindness   [] Dysphagia   [] Weakness or numbness in arms   [] Weakness or numbness in legs Musculoskeletal:  [] Arthritis   [] Joint swelling   [] Joint pain   [] Low back pain Hematologic:  [] Easy bruising  [] Easy bleeding   [] Hypercoagulable state   [] Anemic  [] Hepatitis Gastrointestinal:  [] Blood in stool   [] Vomiting blood  [] Gastroesophageal reflux/heartburn   [] Difficulty swallowing. Genitourinary:  [] Chronic kidney disease   [] Difficult urination  [] Frequent urination  [] Burning with urination   [] Blood in urine Skin:  [] Rashes   [] Ulcers   [x] Wounds Psychological:  [] History of anxiety   []  History of major depression.  Physical Examination  Vitals:   07/28/18 1015 07/28/18 1019 07/28/18 1043  BP:  (!) 137/99   Pulse:  (!) 128   Temp:   97.8 F (36.6 C)  TempSrc:   Oral  SpO2:  98%   Weight: 74.8 kg    Height: 5\' 7"  (1.702 m)     Body mass index is 25.84 kg/m. Gen:  WD/WN, NAD Head: [place]/AT, No temporalis wasting. Prominent temp pulse not noted. Ear/Nose/Throat: Hearing grossly intact, nares w/o erythema or drainage, oropharynx w/o Erythema/Exudate Eyes: Sclera non-icteric, conjunctiva clear Neck: Trachea midline.  No JVD.  Pulmonary:  Good air movement, respirations not labored, equal bilaterally.  Cardiac: RRR, normal S1, S2. Vascular:  Vessel Right Left  Radial Palpable Non-Palpable  Ulnar Palpable Palpable  Brachial Palpable Palpable  Carotid Palpable, without bruit Palpable, without bruit  Aorta Not palpable N/A  Femoral Palpable Palpable  Popliteal Palpable Palpable  PT Palpable Palpable  DP Palpable Palpable   Left upper extremity: with ulceration noted to the radial aspect of the forearm profusely bleeding when pressure is not held.  Left hand is warm and perfused with a good capillary refill.  Motor / Sensory is intact.  Gastrointestinal: soft, non-tender/non-distended. No guarding/reflex.  Musculoskeletal: M/S 5/5 throughout.  Extremities without ischemic  changes.  No deformity or atrophy. No edema. Neurologic: Sensation grossly intact in extremities.  Symmetrical.  Speech is fluent. Motor exam as listed above. Psychiatric: Judgment intact, Mood & affect appropriate for pt's clinical situation. Dermatologic: No rashes or ulcers noted.  No cellulitis or open wounds. Lymph : No Cervical, Axillary, or Inguinal lymphadenopathy.  CBC Lab Results  Component Value Date   WBC 8.1 07/28/2018   HGB 15.1 07/28/2018   HCT 44.6 07/28/2018   MCV 90.1 07/28/2018   PLT 212 07/28/2018   BMET    Component Value Date/Time   NA 138 01/15/2018 0757   NA 137 08/12/2016 1511   NA 135 07/18/2014 0654   K 4.8 01/15/2018 0757   K 4.1 07/18/2014 0654   CL 100 01/15/2018 0757   CL 104 07/18/2014 0654   CO2 31 01/15/2018 0757   CO2 26 07/18/2014 0654   GLUCOSE 140 (H) 01/15/2018 0757   GLUCOSE 172 (H) 07/18/2014 0654   BUN 13 01/15/2018  0757   BUN 13 08/12/2016 1511   BUN 7 07/18/2014 0654   CREATININE 0.92 01/15/2018 0757   CALCIUM 9.6 01/15/2018 0757   CALCIUM 8.6 (L) 07/18/2014 0654   GFRNONAA 93 01/15/2018 0757   GFRAA 108 01/15/2018 0757   CrCl cannot be calculated (Patient's most recent lab result is older than the maximum 21 days allowed.).  COAG No results found for: INR, PROTIME  Radiology No results found.  Assessment/Plan The patient is a 58 year old male past medical history of hypertension, history of kidney stones, diabetes, environmental allergies who presents to the [facility] with a chief complaint of laceration to left upper extremity.  1.  Left radial artery ulceration: On physical exam there is profuse bleeding when pressure is not held to the left radial artery.  This is clearly an emergent situation and then taken patient to the OR immediately for repair versus ligation.  Procedure, risks and benefits explained to the patient.  All questions answered.  Patient wishes to proceed. 2.  Hypertension: On  appropriate medications. Encouraged good control as its slows the progression of atherosclerotic disease 3.  Diabetes: On appropriate medications. Encouraged good control as its slows the progression of atherosclerotic disease  Discussed with Dr. Weldon Inches, PA-C  07/28/2018 10:54 AM  This note was created with Dragon medical transcription system.  Any error is purely unintentional.

## 2018-07-28 NOTE — Anesthesia Postprocedure Evaluation (Signed)
Anesthesia Post Note  Patient: Scott [name]  Procedure(s) Performed: RADIAL ARTERY LIGATION LEFT (Left Arm Lower)  Patient location during evaluation: PACU Anesthesia Type: General Level of consciousness: awake and alert Pain management: pain level controlled Vital Signs Assessment: post-procedure vital signs reviewed and stable Respiratory status: spontaneous breathing, nonlabored ventilation, respiratory function stable and patient connected to nasal cannula oxygen Cardiovascular status: blood pressure returned to baseline and stable Postop Assessment: no apparent nausea or vomiting Anesthetic complications: no     Last Vitals:  Vitals:   07/28/18 1315 07/28/18 1331  BP: 117/61 101/78  Pulse: (!) 116 (!) 104  Resp: (!) 22 18  Temp: 36.7 C (!) 36.3 C  SpO2: 96% 96%    Last Pain:  Vitals:   07/28/18 1331  TempSrc: Oral  PainSc:                  Cleda Mccreedy Akshith Moncus

## 2018-07-28 NOTE — ED Notes (Signed)
Pt transported to OR

## 2018-07-28 NOTE — ED Notes (Signed)
ED Provider at bedside. 

## 2018-07-29 LAB — CBC
HCT: 36.2 % — ABNORMAL LOW (ref 39.0–52.0)
Hemoglobin: 12.4 g/dL — ABNORMAL LOW (ref 13.0–17.0)
MCH: 30.7 pg (ref 26.0–34.0)
MCHC: 34.3 g/dL (ref 30.0–36.0)
MCV: 89.6 fL (ref 80.0–100.0)
Platelets: 197 10*3/uL (ref 150–400)
RBC: 4.04 MIL/uL — ABNORMAL LOW (ref 4.22–5.81)
RDW: 12.3 % (ref 11.5–15.5)
WBC: 14.1 10*3/uL — ABNORMAL HIGH (ref 4.0–10.5)
nRBC: 0 % (ref 0.0–0.2)

## 2018-07-29 LAB — GLUCOSE, CAPILLARY
Glucose-Capillary: 227 mg/dL — ABNORMAL HIGH (ref 70–99)
Glucose-Capillary: 168 mg/dL — ABNORMAL HIGH (ref 70–99)
Glucose-Capillary: 186 mg/dL — ABNORMAL HIGH (ref 70–99)

## 2018-07-29 LAB — BASIC METABOLIC PANEL
Anion gap: 11 (ref 5–15)
BUN: 15 mg/dL (ref 6–20)
CO2: 24 mmol/L (ref 22–32)
Calcium: 8.7 mg/dL — ABNORMAL LOW (ref 8.9–10.3)
Chloride: 100 mmol/L (ref 98–111)
Creatinine, Ser: 0.85 mg/dL (ref 0.61–1.24)
GFR calc Af Amer: >60 mL/min (ref >60)
GFR calc non Af Amer: >60 mL/min (ref >60)
Glucose, Bld: 210 mg/dL — ABNORMAL HIGH (ref 70–99)
Potassium: 4.2 mmol/L (ref 3.5–5.1)
Sodium: 135 mmol/L (ref 135–145)

## 2018-07-29 MED ORDER — ASPIRIN 81 MG PO TBEC: 81.0000 mg | DELAYED_RELEASE_TABLET | Freq: Every day | ORAL | 3 refills | Status: DC

## 2018-07-29 MED ORDER — HYDROCODONE-ACETAMINOPHEN 5-325 MG PO TABS: 1.0000 | ORAL_TABLET | Freq: Four times a day (QID) | ORAL | 0 refills | Status: DC | PRN

## 2018-07-29 NOTE — Discharge Instructions (Signed)
You may shower as of Saturday. Keep incision and staple line clean and dry.  Warm compress to area. Elevate your left arm if you notice swelling.  Pain medication has been sent electronically to your pharmacy   Patient may return to work on Monday August 09, 2018 If you have any questions please call [facility] @ [phone]

## 2018-07-29 NOTE — Evaluation (Signed)
Occupational Therapy Evaluation Patient Details Name: Scott [name] MRN: [id number] DOB: 03-17-1961 Today's Date: 07/29/2018    History of Present Illness 58yo male s/p Left forearm exploration and repair/ligation of radial artery on 07/28/18 after workplace injury.    Clinical Impression   Pt seen for OT evaluation this date. 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 Pt states, "95% of what I do involves my hands."  Pt lives with his wife in a 1 level mobile home with 5 steps to enter and bilateral rails. Currently pt demonstrates impairments in LUE pain, wrist/forearm ROM, and strength which impairs his ability to perform IADL and FMC tasks at baseline independence. Pt instructed in s/s infection, positioning to minimize swelling, and encouraged to avoid end ROM activities of the wrist and forearm until further instruction is provided by a hand therapist. No splint/brace required at this time. Defer to OP hand therapist for further instruction. Pt will benefit from skilled outpatient hand therapy OT upon discharge. No further acute OT needs.     Follow Up Recommendations  Outpatient OT(hand therapy OT)    Equipment Recommendations  None recommended by OT    Recommendations for Other Services       Precautions / Restrictions Precautions Precautions: None Restrictions Weight Bearing Restrictions: No Other Position/Activity Restrictions: no formal LUE precautions, encouraged pt to avoid end ROM for wrist extension and flexion, weightbearing through wrist, and lifting until start of outpatient OT/hand therapy      Mobility Bed Mobility Overal bed mobility: Modified Independent             General bed mobility comments: pt careful to protect LUE  Transfers Overall transfer level: Independent                     Balance Overall balance assessment: No apparent balance deficits (not formally assessed)                                         ADL either performed or assessed with clinical judgement   ADL Overall ADL's : Modified independent                                       General ADL Comments: pt able to perform ADL involving L dominant UE with modified independent, to avoid extreme wrist ROM and forearm supination/pronation     Vision Baseline Vision/History: (hx of extropia) Patient Visual Report: No change from baseline       Perception     Praxis      Pertinent Vitals/Pain Pain Assessment: 0-10 Pain Score: 4  Pain Location: L forearm Pain Descriptors / Indicators: Aching Pain Intervention(s): Limited activity within patient's tolerance;Monitored during session;Repositioned;Patient requesting pain meds-RN notified     Hand Dominance Left   Extremity/Trunk Assessment Upper Extremity Assessment Upper Extremity Assessment: LUE deficits/detail(RUE WFL) LUE Deficits / Details: shoulder/elbow strength/ROM WFL, end range wrist ext/flex limited by surgical incision site pain, forearm supination/pronation also painful, full thumb opposition and good grip strength, denies sensory all deficits LUE: Unable to fully assess due to pain LUE Sensation: WNL LUE Coordination: WNL   Lower Extremity  Assessment Lower Extremity Assessment: Overall WFL for tasks assessed   Cervical / Trunk Assessment Cervical / Trunk Assessment: Normal   Communication Communication Communication: No difficulties   Cognition Arousal/Alertness: Awake/alert Behavior During Therapy: WFL for tasks assessed/performed Overall Cognitive Status: Within Functional Limits for tasks assessed                                     General Comments  PA in room near start of session to remove bandage, surgical site dry and staples intact    Exercises Other  Exercises Other Exercises: pt instructed in precautions for LUE including keeping surgical site clean and dry, s/s infection, minimizing weightbearing and lifting through L wrist, positioning to elevate LUE to minimize swelling, and AROM ex for L hand/fingers   Shoulder Instructions      Home Living Family/patient expects to be discharged to:: Private residence Living Arrangements: Spouse/significant other Available Help at Discharge: Family;Available 24 hours/day Type of Home: Mobile home Home Access: Stairs to enter Entrance Stairs-Number of Steps: 5 Entrance Stairs-Rails: Right;Left;Can reach both Home Layout: One level     Bathroom Shower/Tub: Chief Strategy OfficerTub/shower unit   Bathroom Toilet: Handicapped height     Home Equipment: None          Prior Functioning/Environment Level of Independence: Needs assistance  Gait / Transfers Assistance Needed: indep with mobility, no falls ADL's / Homemaking Assistance Needed: indep with ADL and most IADL; spouse drives and pt works full time working with machinery ("I use my hands for 95% of my work")            OT Problem List: Decreased strength;Decreased range of motion;Impaired UE functional use;Pain      OT Treatment/Interventions:      OT Goals(Current goals can be found in the care plan section) Acute Rehab OT Goals Patient Stated Goal: to regain LUE functional use OT Goal Formulation: All assessment and education complete, DC therapy  OT Frequency:     Barriers to D/C:            Co-evaluation              AM-PAC OT "6 Clicks" Daily Activity     Outcome Measure Help from another person eating meals?: None Help from another person taking care of personal grooming?: None Help from another person toileting, which includes using toliet, bedpan, or urinal?: None Help from another person bathing (including washing, rinsing, drying)?: None Help from another person to put on and taking off regular upper body clothing?:  None Help from another person to put on and taking off regular lower body clothing?: None 6 Click Score: 24   End of Session Nurse Communication: Patient requests pain meds  Activity Tolerance: Patient tolerated treatment well Patient left: in bed;with call bell/phone within reach  OT Visit Diagnosis: Pain Pain - Right/Left: Left Pain - part of body: Arm;Hand                Time: [id number] OT Time Calculation (min): 21 min Charges:  OT General Charges $OT Visit: 1 Visit OT Evaluation $OT Eval Low Complexity: 1 Low OT Treatments $Therapeutic Exercise: 8-22 mins  Richrd PrimeJamie Stiller, MPH, MS, OTR/L ascom [phone] 07/29/18, 10:05 AM

## 2018-07-29 NOTE — Progress Notes (Signed)
[facility] [address] [place], [place] [address] [phone]   To whom it may concern:  The patient was seen and treated at [facility] on 07/28/2018-07/29/2018. He may return to work on 08/09/2018.  If you have any questions please call [phone].  Thank you.  Cleda Daub PA-C Vascular Surgery 07/29/2018

## 2018-07-29 NOTE — Progress Notes (Signed)
Scott [name]  A and O x 4. VSS. Pt tolerating diet well. No complaints of pain or nausea. IV removed intact, prescriptions given. Pt voiced understanding of discharge instructions with no further questions. Pt discharged via wheelchair.   Allergies as of 07/29/2018   No Known Allergies     Medication List    TAKE these medications   aspirin 81 MG EC tablet Take 1 tablet (81 mg total) by mouth daily. Start taking on:  July 30, 2018   atorvastatin 20 MG tablet Commonly known as:  LIPITOR TAKE 1 TABLET(20 MG) BY MOUTH DAILY What changed:  See the new instructions.   HYDROcodone-acetaminophen 5-325 MG tablet Commonly known as:  NORCO/VICODIN Take 1 tablet by mouth every 6 (six) hours as needed for moderate pain or severe pain.   lisinopril 10 MG tablet Commonly known as:  PRINIVIL,ZESTRIL Take 1 tablet (10 mg total) by mouth daily. What changed:  when to take this   metFORMIN 500 MG tablet Commonly known as:  GLUCOPHAGE Take 1 tablet (500 mg total) by mouth daily with breakfast. What changed:  when to take this       Vitals:   07/28/18 2205 07/29/18 0301  BP: 123/79 104/61  Pulse: (!) 102 78  Resp:  18  Temp:  (!) 97.3 F (36.3 C)  SpO2:  99%    Scott [name]

## 2018-07-29 NOTE — Progress Notes (Signed)
Per MD okay for RN to DC IVF orders.

## 2018-07-29 NOTE — Discharge Summary (Signed)
[facility]    Discharge Summary  Patient ID:  Scott [name] MRN: [id number] DOB/AGE: [birth year] 58 y.o.  Admit date: 07/28/2018 Discharge date: 07/29/2018 Date of Surgery: 07/28/2018 Surgeon: Surgeon(s): Annice Needy, MD  Admission Diagnosis: Forearm laceration with complication, initial encounter [S51.819A]  Discharge Diagnoses:  Forearm laceration with complication, initial encounter [S51.819A]  Secondary Diagnoses: Past Medical History:  Diagnosis Date  . Allergy   . Diabetes mellitus without complication (HCC)    pt. stated "I don't have diabetes", says last time he was in hospital the dx changed  . History of kidney stones    H/O STONES  . Hypertension    Procedure(s): RADIAL ARTERY LIGATION LEFT  Discharged Condition: good  HPI:  The patient is a 58 year old male with a past medical history of hypertension, history of kidney stones, diabetes, environmental allergies who presents to the [facility] with a chief complaint of laceration to left radial artery.  He sustained this injury with a saw while working.  The patient was emergently taken to the operating room on July 26, 2018 and underwent a left forearm exploration with repair/ligation of radial artery.  He tolerated the procedure well was transferred to the recovery room then surgical floor without issue.  The patient's night of surgery was unremarkable.  During the patient's brief inpatient stay, he was seen by Occupational Therapy who recommended outpatient occupational therapy services.  Upon discharge, the patient was tolerating regular diet, his pain was controlled with the use of p.o. pain medication he was urinating independently and ambulating without issue.  Hospital Course:  Scott [name] is a 58 y.o. male is S/P:  Procedure(s): RADIAL ARTERY LIGATION (LEFT)  Extubated: POD # 0  Physical exam:  A&Ox3, NAD CV: RRR Pulmonary: CTA  Bilaterally Abdomen: Soft, Non-tender, Non-distended, (+) Bowel Sounds Left Upper Extremity: OR dressing removed. Staples intact, clean and dry. Minimal swelling, no ecchymosis. Sensation and motor function present to hand / fingers. Good ulnar pulse. Hand is warm with a good capillary refill.   Post-op wounds clean, dry, intact or healing well  Pt. Ambulating, voiding and taking PO diet without difficulty.  Pt pain controlled with PO pain meds.  Labs as below  Complications: None  Consults: None  Significant Diagnostic Studies: CBC Lab Results  Component Value Date   WBC 14.1 (H) 07/29/2018   HGB 12.4 (L) 07/29/2018   HCT 36.2 (L) 07/29/2018   MCV 89.6 07/29/2018   PLT 197 07/29/2018   BMET    Component Value Date/Time   NA 135 07/29/2018 0403   NA 137 08/12/2016 1511   NA 135 07/18/2014 0654   K 4.2 07/29/2018 0403   K 4.1 07/18/2014 0654   CL 100 07/29/2018 0403   CL 104 07/18/2014 0654   CO2 24 07/29/2018 0403   CO2 26 07/18/2014 0654   GLUCOSE 210 (H) 07/29/2018 0403   GLUCOSE 172 (H) 07/18/2014 0654   BUN 15 07/29/2018 0403   BUN 13 08/12/2016 1511   BUN 7 07/18/2014 0654   CREATININE 0.85 07/29/2018 0403   CREATININE 0.92 01/15/2018 0757   CALCIUM 8.7 (L) 07/29/2018 0403   CALCIUM 8.6 (L) 07/18/2014 0654   GFRNONAA >60 07/29/2018 0403   GFRNONAA 93 01/15/2018 0757   GFRAA >60 07/29/2018 0403   GFRAA 108 01/15/2018 0757   COAG Lab Results  Component Value Date   INR 1.1 07/28/2018   Disposition:  Discharge to :Home  Allergies  as of 07/29/2018   No Known Allergies     Medication List    TAKE these medications   aspirin 81 MG EC tablet Take 1 tablet (81 mg total) by mouth daily. Start taking on:  July 30, 2018   atorvastatin 20 MG tablet Commonly known as:  LIPITOR TAKE 1 TABLET(20 MG) BY MOUTH DAILY What changed:  See the new instructions.   HYDROcodone-acetaminophen 5-325 MG tablet Commonly known as:  NORCO/VICODIN Take 1 tablet by  mouth every 6 (six) hours as needed for moderate pain or severe pain.   lisinopril 10 MG tablet Commonly known as:  PRINIVIL,ZESTRIL Take 1 tablet (10 mg total) by mouth daily. What changed:  when to take this   metFORMIN 500 MG tablet Commonly known as:  GLUCOPHAGE Take 1 tablet (500 mg total) by mouth daily with breakfast. What changed:  when to take this      Verbal and written Discharge instructions given to the patient. Wound care per Discharge AVS Follow-up Information    Georgiana SpinnerBrown, Fallon E, NP Follow up in 2 week(s).   Specialty:  Vascular Surgery Why:  First post-op. Staple removal. To see Vivia BirminghamFallon. Contact information: [address] [place] [address] [phone]          Signed: Tonette LedererKIMBERLY A Cheney Gosch, PA-C  07/29/2018, 10:09 AM

## 2018-07-30 LAB — HIV ANTIBODY (ROUTINE TESTING W REFLEX): HIV Screen 4th Generation wRfx: NONREACTIVE

## 2018-08-05 ENCOUNTER — Telehealth: Payer: Self-pay | Admitting: *Deleted

## 2018-08-05 ENCOUNTER — Telehealth (INDEPENDENT_AMBULATORY_CARE_PROVIDER_SITE_OTHER): Payer: Self-pay | Admitting: Vascular Surgery

## 2018-08-05 NOTE — Telephone Encounter (Signed)
Patient called the office stating that he has been having a problem going to the bathroom. Patient states this has happened because of the pain medication he was given. He reports he has done an enema but that has not helped. Patient reports it just trickles out.   Patient states he had surgery last week completed with Dr. Wyn Quaker but is also a patient of Dr. Lemar Livings.   The patient was instructed to call Dr. Driscilla Grammes office to discuss with them since they did the surgery and prescribed the medication.   He verbalizes understanding.

## 2018-08-05 NOTE — Telephone Encounter (Signed)
Per Sheppard Plumber, NP it is okay to give the patient an extended work note.

## 2018-08-05 NOTE — Telephone Encounter (Signed)
PATIENT HAD SGY LAST WEEK AND AS PER HIS DISCUSSION WITH JD HE WAS TO CALL THE OFFICE IF HE NEEDED A NOTE FOR WORK EXTENDED PAST THIS Friday AND HE DOES ONE THAT NOTE. HE WOULD LIKE TO REMAIN OUT OF WORK THROUGH NEXT WEEK BECAUSE HE IS STILL HAVING ISSUES. IF THIS IS OK I WILL WRITE LETTER FOR PATIENT.

## 2018-08-06 ENCOUNTER — Telehealth (INDEPENDENT_AMBULATORY_CARE_PROVIDER_SITE_OTHER): Payer: Self-pay | Admitting: Vascular Surgery

## 2018-08-06 ENCOUNTER — Encounter (INDEPENDENT_AMBULATORY_CARE_PROVIDER_SITE_OTHER): Payer: Self-pay | Admitting: Vascular Surgery

## 2018-08-06 ENCOUNTER — Encounter (INDEPENDENT_AMBULATORY_CARE_PROVIDER_SITE_OTHER): Payer: Self-pay | Admitting: Nurse Practitioner

## 2018-08-06 ENCOUNTER — Encounter (INDEPENDENT_AMBULATORY_CARE_PROVIDER_SITE_OTHER): Payer: Self-pay

## 2018-08-06 NOTE — Telephone Encounter (Signed)
Patient has receive work note

## 2018-08-12 ENCOUNTER — Encounter (INDEPENDENT_AMBULATORY_CARE_PROVIDER_SITE_OTHER): Payer: Self-pay | Admitting: Nurse Practitioner

## 2018-08-12 ENCOUNTER — Other Ambulatory Visit: Payer: Self-pay

## 2018-08-12 ENCOUNTER — Ambulatory Visit (INDEPENDENT_AMBULATORY_CARE_PROVIDER_SITE_OTHER): Payer: Worker's Compensation | Admitting: Nurse Practitioner

## 2018-08-12 VITALS — BP 156/110 | HR 89 | Resp 12 | Ht 67.0 in | Wt 162.0 lb

## 2018-08-12 DIAGNOSIS — E119 Type 2 diabetes mellitus without complications: Secondary | ICD-10-CM

## 2018-08-12 DIAGNOSIS — S55102D Unspecified injury of radial artery at forearm level, left arm, subsequent encounter: Secondary | ICD-10-CM

## 2018-08-12 DIAGNOSIS — Z7984 Long term (current) use of oral hypoglycemic drugs: Secondary | ICD-10-CM

## 2018-08-12 DIAGNOSIS — Z79899 Other long term (current) drug therapy: Secondary | ICD-10-CM

## 2018-08-12 DIAGNOSIS — I10 Essential (primary) hypertension: Secondary | ICD-10-CM

## 2018-08-12 MED ORDER — DOXYCYCLINE HYCLATE 100 MG PO CAPS: 100.0000 mg | ORAL_CAPSULE | Freq: Two times a day (BID) | ORAL | 0 refills | Status: DC

## 2018-08-12 NOTE — Progress Notes (Signed)
SUBJECTIVE:  Scott [name], male    DOB: May 06, 1960, 58 y.o.   MRN: [id number] Chief Complaint  Patient presents with  . Follow-up    HPI  Scott [name] is a 58 y.o. male that presents today after accidental laceration of his left radial artery on 07/28/2018.  This happened during an accident at the workplace involving scissors.  Since his repair he has not had any issues with the incision or wound.  The wound appears clean dry and intact.  The wound is well approximated with no areas of oozing.  At the very distal portion of the wound there is a very small eruption with some purulent appearing drainage.  Patient denies any fevers, chills, nausea, vomiting or diarrhea.  He denies any chest pain or shortness of breath he denies any TIA-like symptoms.  Past Medical History:  Diagnosis Date  . Allergy   . Diabetes mellitus without complication (HCC)    pt. stated "I don't have diabetes", says last time he was in hospital the dx changed  . History of kidney stones    H/O STONES  . Hypertension     Past Surgical History:  Procedure Laterality Date  . APPENDECTOMY    . CHOLECYSTECTOMY    . COLONOSCOPY WITH PROPOFOL N/A 06/24/2017   Procedure: COLONOSCOPY WITH PROPOFOL;  Surgeon: Earline Mayotte, MD;  Location: [facility] ENDOSCOPY;  Service: Endoscopy;  Laterality: N/A;  . LAPAROTOMY N/A 02/05/2016   Procedure: EXPLORATORY LAPAROTOMY FOR SMALL BOWEL OBSTRUCTION;  Surgeon: Kieth Brightly, MD;  Location: [facility] ORS;  Service: General;  Laterality: N/A;  . LYSIS OF ADHESION  02/05/2016   Procedure: LYSIS OF ADHESION;  Surgeon: Kieth Brightly, MD;  Location: [facility] ORS;  Service: General;;  . SMALL INTESTINE SURGERY  2005   Excision of Meckel's diverticulum  . VEIN REPAIR Left 07/28/2018   Procedure: RADIAL ARTERY LIGATION LEFT;  Surgeon: Annice Needy, MD;  Location: [facility] ORS;  Service: Vascular;  Laterality: Left;    Social History   Socioeconomic History   . Marital status: Married    Spouse name: Not on file  . Number of children: Not on file  . Years of education: Not on file  . Highest education level: Not on file  Occupational History  . Not on file  Social Needs  . Financial resource strain: Not on file  . Food insecurity:    Worry: Not on file    Inability: Not on file  . Transportation needs:    Medical: Not on file    Non-medical: Not on file  Tobacco Use  . Smoking status: Never Smoker  . Smokeless tobacco: Never Used  Substance and Sexual Activity  . Alcohol use: Yes    Alcohol/week: 0.0 standard drinks    Comment: occasional  . Drug use: No  . Sexual activity: Not on file  Lifestyle  . Physical activity:    Days per week: Not on file    Minutes per session: Not on file  . Stress: Not on file  Relationships  . Social connections:    Talks on phone: Not on file    Gets together: Not on file    Attends religious service: Not on file    Active member of club or organization: Not on file    Attends meetings of clubs or organizations: Not on file    Relationship status: Not on file  . Intimate partner violence:    Fear  of current or ex partner: Not on file    Emotionally abused: Not on file    Physically abused: Not on file    Forced sexual activity: Not on file  Other Topics Concern  . Not on file  Social History Narrative  . Not on file    Family History  Problem Relation Age of Onset  . Heart disease Mother   . Hypertension Mother   . Diabetes Mother   . Diabetes Sister   . Diabetes Brother     No Known Allergies   Review of Systems   Review of Systems: Negative Unless Checked Constitutional: [] Weight loss  [] Fever  [] Chills Cardiac: [] Chest pain   []  Atrial Fibrillation  [] Palpitations   [] Shortness of breath when laying flat   [] Shortness of breath with exertion. [] Shortness of breath at rest Vascular:  [] Pain in legs with walking   [] Pain in legs with standing [] Pain in legs when laying flat    [] Claudication    [] Pain in feet when laying flat    [] History of DVT   [] Phlebitis   [] Swelling in legs   [] Varicose veins   [] Non-healing ulcers Pulmonary:   [] Uses home oxygen   [] Productive cough   [] Hemoptysis   [] Wheeze  [] COPD   [] Asthma Neurologic:  [] Dizziness   [] Seizures  [] Blackouts [] History of stroke   [] History of TIA  [] Aphasia   [] Temporary Blindness   [x] Weakness or numbness in arm   [] Weakness or numbness in leg Musculoskeletal:   [] Joint swelling   [] Joint pain   [] Low back pain  []  History of Knee Replacement [] Arthritis [] back Surgeries  []  Spinal Stenosis    Hematologic:  [] Easy bruising  [] Easy bleeding   [] Hypercoagulable state   [] Anemic Gastrointestinal:  [] Diarrhea   [] Vomiting  [] Gastroesophageal reflux/heartburn   [] Difficulty swallowing. [] Abdominal pain Genitourinary:  [] Chronic kidney disease   [] Difficult urination  [] Anuric   [] Blood in urine [] Frequent urination  [] Burning with urination   [] Hematuria Skin:  [] Rashes   [] Ulcers [x] Wounds Psychological:  [] History of anxiety   []  History of major depression  []  Memory Difficulties      OBJECTIVE:   Physical Exam  BP (!) 156/110 (BP Location: Left Arm, Patient Position: Sitting, Cuff Size: Small)   Pulse 89   Resp 12   Ht 5\' 7"  (1.702 m)   Wt 162 lb (73.5 kg)   BMI 25.37 kg/m   Gen: WD/WN, NAD Head: [place]/AT, No temporalis wasting.  Ear/Nose/Throat: Hearing grossly intact, nares w/o erythema or drainage Eyes: PER, EOMI, sclera nonicteric.  Neck: Supple, no masses.  No JVD.  Pulmonary:  Good air movement, no use of accessory muscles.  Cardiac: RRR Vascular:  Well approximated radial wound. Vessel Right Left  Radial Palpable Palpable   Gastrointestinal: soft, non-distended. No guarding/no peritoneal signs.  Musculoskeletal: M/S 5/5 throughout.  No deformity or atrophy.  Neurologic: Pain and light touch intact in extremities.  Symmetrical.  Speech is fluent. Motor exam as listed above. Psychiatric:  Judgment intact, Mood & affect appropriate for pt's clinical situation. Dermatologic: No Venous rashes. No Ulcers Noted.  No changes consistent with cellulitis. Lymph : No Cervical lymphadenopathy, no lichenification or skin changes of chronic lymphedema.       ASSESSMENT AND PLAN:  1. Injury of radial artery, left, subsequent encounter The patient tolerated staple removal well.  Little bleeding after staple removal.  There was a small blister with some purulent drainage near the distal end of his incision.  In  order to ensure there is no further infection we will do 10 days of doxycycline.  Steri-Strips were applied to the wound and he was notified that he can wash it and should allow the Steri-Strips to fall off.  We will have the patient follow-up in office in 6 weeks to determine if the wound has completely healed. - doxycycline (VIBRAMYCIN) 100 MG capsule; Take 1 capsule (100 mg total) by mouth 2 (two) times daily.  Dispense: 20 capsule; Refill: 0  2. Essential hypertension Continue antihypertensive medications as already ordered, these medications have been reviewed and there are no changes at this time.   3. Controlled type 2 diabetes mellitus without complication, without long-term current use of insulin (HCC) Continue hypoglycemic medications as already ordered, these medications have been reviewed and there are no changes at this time.  Hgb A1C to be monitored as already arranged by primary service    Current Outpatient Medications on File Prior to Visit  Medication Sig Dispense Refill  . aspirin EC 81 MG EC tablet Take 1 tablet (81 mg total) by mouth daily. 90 tablet 3  . atorvastatin (LIPITOR) 20 MG tablet TAKE 1 TABLET(20 MG) BY MOUTH DAILY (Patient taking differently: Take 20 mg by mouth at bedtime. ) 30 tablet 0  . HYDROcodone-acetaminophen (NORCO/VICODIN) 5-325 MG tablet Take 1 tablet by mouth every 6 (six) hours as needed for moderate pain or severe pain. 28 tablet 0   . lisinopril (PRINIVIL,ZESTRIL) 10 MG tablet Take 1 tablet (10 mg total) by mouth daily. (Patient taking differently: Take 10 mg by mouth at bedtime. ) 90 tablet 1  . metFORMIN (GLUCOPHAGE) 500 MG tablet Take 1 tablet (500 mg total) by mouth daily with breakfast. (Patient taking differently: Take 500 mg by mouth at bedtime. ) 90 tablet 3   No current facility-administered medications on file prior to visit.     There are no Patient Instructions on file for this visit. No follow-ups on file.   Georgiana SpinnerFallon E Charmika Macdonnell, NP  This note was completed with Office managerDragon Dictation.  Any errors are purely unintentional.

## 2018-08-31 ENCOUNTER — Telehealth: Payer: Self-pay

## 2018-09-01 NOTE — Telephone Encounter (Signed)
The pt called and updated his phone number in the system.

## 2018-09-07 ENCOUNTER — Other Ambulatory Visit: Payer: Self-pay

## 2018-09-07 ENCOUNTER — Encounter: Payer: Self-pay | Admitting: Family Medicine

## 2018-09-07 ENCOUNTER — Ambulatory Visit (INDEPENDENT_AMBULATORY_CARE_PROVIDER_SITE_OTHER): Payer: BLUE CROSS/BLUE SHIELD | Admitting: Family Medicine

## 2018-09-07 VITALS — BP 140/92 | HR 103 | Temp 98.1°F | Resp 16 | Ht 67.0 in | Wt 162.0 lb

## 2018-09-07 DIAGNOSIS — M5442 Lumbago with sciatica, left side: Secondary | ICD-10-CM

## 2018-09-07 MED ORDER — PREDNISONE 10 MG PO TABS: ORAL_TABLET | ORAL | 0 refills | Status: DC

## 2018-09-07 MED ORDER — NAPROXEN 500 MG PO TABS: 500.0000 mg | ORAL_TABLET | Freq: Two times a day (BID) | ORAL | 0 refills | Status: DC

## 2018-09-07 NOTE — Progress Notes (Signed)
Subjective:    Patient ID: Scott [name], male    DOB: 01/12/1961, 58 y.o.   MRN: [id number]  Scott [name] is a 58 y.o. male presenting on 09/07/2018 for Hip Pain (left side onset couple of weeks )  PCP is Wilhelmina McardleLauren Kennedy, AGPCNP-BC - I am currently covering during her maternity leave.   HPI   LOW BACK PAIN vs Left Hip pain / LEFT SCIATICA - Reports symptoms started about 3-4 ago without inciting injury. 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 - Today seems to be gradually worsening vs persistent. Describes pain as mild to moderate at rest, and more severe with radiating pain down left leg with stinging quality at times, worse with lifting bending twisting, or prolong standing and sitting, improve with rest off his feet - Taking Tylenol rarely and BC powder rarely - Tried heating pad at night, ice, muscle rub with temporary relief - History of lumbar OA/DJD, had been to chiropractor in past. No history of back surgery - Admits difficulty sleeping due to pain at times if radiating pain not lately however - Denies any fevers/chills, numbness, tingling, weakness, loss of control bladder/bowel incontinence or retention, unintentional wt loss, night sweats   Depression screen [facility] 2/9 09/07/2018 06/10/2018 10/06/2017  Decreased Interest 0 0 0  Down, Depressed, Hopeless 0 0 0  PHQ - 2 Score 0 0 0    Social History   Tobacco Use  . Smoking status: Never Smoker  . Smokeless tobacco: Never Used  Substance Use Topics  . Alcohol use: Yes    Alcohol/week: 0.0 standard drinks    Comment: occasional  . Drug use: No    Review of Systems Per HPI unless specifically indicated above     Objective:    BP (!) 140/92   Pulse (!) 103   Temp 98.1 F (36.7 C) (Oral)   Resp 16   Ht 5\' 7"  (1.702 m)   Wt 162 lb (73.5 kg)   BMI 25.37 kg/m   Wt Readings from Last 3 Encounters:  09/07/18 162 lb (73.5 kg)  08/12/18  162 lb (73.5 kg)  07/29/18 159 lb 2.8 oz (72.2 kg)    Physical Exam Vitals signs and nursing note reviewed.  Constitutional:      General: He is not in acute distress.    Appearance: He is well-developed. He is not diaphoretic.  Musculoskeletal:     Comments: Low Back Inspection: Normal appearance, no spinal deformity, symmetrical. Palpation: No tenderness over spinous processes. Bilateral lumbar paraspinal muscles non-tender and without hypertonicity/spasm. Pain localized to left lower paraspinal region SI ROM: Full active ROM forward flex / back extension, rotation L/R without discomfort Special Testing: Seated SLR POSITIVE on LEFT for pain reproduced radicular symptoms, Right negative for radicular pain  Strength: Bilateral hip flex/ext 5/5, knee flex/ext 5/5, ankle dorsiflex/plantarflex 5/5 Neurovascular: intact distal sensation to light touch   Skin:    General: Skin is warm and dry.  Neurological:     Mental Status: He is alert.        Results for orders placed or performed during the hospital encounter of 07/28/18  CBC with Differential/Platelet  Result Value Ref Range   WBC 8.1 4.0 - 10.5 K/uL   RBC 4.95 4.22 - 5.81 MIL/uL   Hemoglobin 15.1 13.0 - 17.0 g/dL   HCT 40.944.6 81.139.0 - 91.452.0 %   MCV 90.1 80.0 - 100.0 fL  MCH 30.5 26.0 - 34.0 pg   MCHC 33.9 30.0 - 36.0 g/dL   RDW 29.7 98.9 - 21.1 %   Platelets 212 150 - 400 K/uL   nRBC 0.0 0.0 - 0.2 %   Neutrophils Relative % 69 %   Neutro Abs 5.7 1.7 - 7.7 K/uL   Lymphocytes Relative 22 %   Lymphs Abs 1.8 0.7 - 4.0 K/uL   Monocytes Relative 6 %   Monocytes Absolute 0.5 0.1 - 1.0 K/uL   Eosinophils Relative 2 %   Eosinophils Absolute 0.1 0.0 - 0.5 K/uL   Basophils Relative 1 %   Basophils Absolute 0.1 0.0 - 0.1 K/uL   Immature Granulocytes 0 %   Abs Immature Granulocytes 0.03 0.00 - 0.07 K/uL  Comprehensive metabolic panel  Result Value Ref Range   Sodium 132 (L) 135 - 145 mmol/L   Potassium 3.8 3.5 - 5.1 mmol/L    Chloride 94 (L) 98 - 111 mmol/L   CO2 23 22 - 32 mmol/L   Glucose, Bld 338 (H) 70 - 99 mg/dL   BUN 13 6 - 20 mg/dL   Creatinine, Ser 9.41 0.61 - 1.24 mg/dL   Calcium 9.0 8.9 - 74.0 mg/dL   Total Protein 7.4 6.5 - 8.1 g/dL   Albumin 4.4 3.5 - 5.0 g/dL   AST 52 (H) 15 - 41 U/L   ALT 62 (H) 0 - 44 U/L   Alkaline Phosphatase 52 38 - 126 U/L   Total Bilirubin 0.9 0.3 - 1.2 mg/dL   GFR calc non Af Amer >60 >60 mL/min   GFR calc Af Amer >60 >60 mL/min   Anion gap 15 5 - 15  Glucose, capillary  Result Value Ref Range   Glucose-Capillary 271 (H) 70 - 99 mg/dL  Glucose, capillary  Result Value Ref Range   Glucose-Capillary 209 (H) 70 - 99 mg/dL  HIV antibody (Routine Testing)  Result Value Ref Range   HIV Screen 4th Generation wRfx Non Reactive Non Reactive  Glucose, capillary  Result Value Ref Range   Glucose-Capillary 253 (H) 70 - 99 mg/dL   Comment 1 Notify RN   Urine Drug Screen, Qualitative ([facility] only)  Result Value Ref Range   Tricyclic, Ur Screen NONE DETECTED NONE DETECTED   Amphetamines, Ur Screen NONE DETECTED NONE DETECTED   MDMA (Ecstasy)Ur Screen NONE DETECTED NONE DETECTED   Cocaine Metabolite,Ur [place] NONE DETECTED NONE DETECTED   Opiate, Ur Screen POSITIVE (A) NONE DETECTED   Phencyclidine (PCP) Ur S NONE DETECTED NONE DETECTED   Cannabinoid 50 Ng, Ur [place] NONE DETECTED NONE DETECTED   Barbiturates, Ur Screen NONE DETECTED NONE DETECTED   Benzodiazepine, Ur Scrn NONE DETECTED NONE DETECTED   Methadone Scn, Ur NONE DETECTED NONE DETECTED  Basic metabolic panel  Result Value Ref Range   Sodium 135 135 - 145 mmol/L   Potassium 4.2 3.5 - 5.1 mmol/L   Chloride 100 98 - 111 mmol/L   CO2 24 22 - 32 mmol/L   Glucose, Bld 210 (H) 70 - 99 mg/dL   BUN 15 6 - 20 mg/dL   Creatinine, Ser 8.14 0.61 - 1.24 mg/dL   Calcium 8.7 (L) 8.9 - 10.3 mg/dL   GFR calc non Af Amer >60 >60 mL/min   GFR calc Af Amer >60 >60 mL/min   Anion gap 11 5 - 15  CBC  Result Value Ref Range   WBC  14.1 (H) 4.0 - 10.5 K/uL   RBC 4.04 (L) 4.22 -  5.81 MIL/uL   Hemoglobin 12.4 (L) 13.0 - 17.0 g/dL   HCT 96.0 (L) 45.4 - 09.8 %   MCV 89.6 80.0 - 100.0 fL   MCH 30.7 26.0 - 34.0 pg   MCHC 34.3 30.0 - 36.0 g/dL   RDW 11.9 14.7 - 82.9 %   Platelets 197 150 - 400 K/uL   nRBC 0.0 0.0 - 0.2 %  Protime-INR  Result Value Ref Range   Prothrombin Time 14.0 11.4 - 15.2 seconds   INR 1.1 0.8 - 1.2  APTT  Result Value Ref Range   aPTT 26 24 - 36 seconds  Glucose, capillary  Result Value Ref Range   Glucose-Capillary 168 (H) 70 - 99 mg/dL   Comment 1 Notify RN   Glucose, capillary  Result Value Ref Range   Glucose-Capillary 227 (H) 70 - 99 mg/dL  Glucose, capillary  Result Value Ref Range   Glucose-Capillary 186 (H) 70 - 99 mg/dL   Comment 1 Notify RN   Type and screen  Result Value Ref Range   ABO/RH(D) A POS    Antibody Screen NEG    Sample Expiration      07/31/2018 Performed at [facility], [address], [place], [place] [address]       Assessment & Plan:   Problem List Items Addressed This Visit    None    Visit Diagnoses    Acute left-sided low back pain with left-sided sciatica    -  Primary   Relevant Medications   predniSONE (DELTASONE) 10 MG tablet   naproxen (NAPROSYN) 500 MG tablet      Subacute L LBP with associated L sciatica. Suspect likely due to muscle spasm/strain, without known injury or trauma.  Suspected OA/DJD with other history of arthritis - No prior back surgery - No red flag symptoms. Except positive LEFT SLR for radiculopathy - Inadequate conservative therapy   Plan: 1. Start Prednisone dosepak taper 60 to  over 6 days, caution with hyperglycemia / HTN as diabetic, he is aware of risks and benefits 2. After prednisone then - start NSAID trial with rx Naprosyn  BID wc x 1-2 weeks, then PRN 3. May use Tylenol PRN for breakthrough 4. Encouraged use of heating pad 1-2x daily for now then PRN 5. Follow-up 4-6 weeks if not  improved for re-evaluation, consider X-ray imaging, trial of PT, and possibly referral to Orthopedic   Meds ordered this encounter  Medications  . predniSONE (DELTASONE) 10 MG tablet    Sig: Take 6 tabs with breakfast Day 1, 5 tabs Day 2, 4 tabs Day 3, 3 tabs Day 4, 2 tabs Day 5, 1 tab Day 6.    Dispense:  21 tablet    Refill:  0  . naproxen (NAPROSYN) 500 MG tablet    Sig: Take 1 tablet (500 mg total) by mouth 2 (two) times daily with a meal. For 1-2 weeks then as needed    Dispense:  60 tablet    Refill:  0      Follow up plan: Return in about 6 weeks (around 10/19/2018), or if symptoms worsen or fail to improve, for back pain sciatica.   Saralyn Pilar, DO [facility] 09/07/2018, 3:21 PM

## 2018-09-07 NOTE — Patient Instructions (Addendum)
Thank you for coming to the office today.  1. For your Back Pain - I think that this is due to Muscle Spasms or strain. Your Sciatic Nerve can be affected causing some of your radiation and numbness down your legs. 2. Start with anti-inflammatory   - Start Prednisone taper (steroid anti-inflammatory) for nerve irritation with pain in legs. Each pill is 10mg . Take 6 pills (60mg  daily) for 1 day at same time with breakfast, then each day reduce dose by 1 pill, so 5 pills, then 4, then 3, then 2 then 1 (last 6 days). Do not take any Ibuprofen or Aleve while taking the Prednisone.  - Once finished Prednisone, then start with other anti-inflammatory Naproxen 500mg  one pill with food every 12 hours (or 2 times a day) take it every day for at least 1 to 2 weeks then only as needed  May use Tylenol Extra Str 500mg  tabs - may take 1-2 tablets every 6 hours as needed  Recommend to start using heating pad on your lower back 1-2x daily for few weeks  This pain may take weeks to months to fully resolve, but hopefully it will respond to the medicine initially. All back injuries (small or serious) are slow to heal since we use our back muscles every day. Be careful with turning, twisting, lifting, sitting / standing for prolonged periods, and avoid re-injury.  If your symptoms significantly worsen with more pain, or new symptoms with weakness in one or both legs, new or different shooting leg pains, numbness in legs or groin, loss of control or retention of urine or bowel movements, please call back for advice and you may need to go directly to the Emergency Department.   Please schedule a Follow-up Appointment to: Return in about 6 weeks (around 10/19/2018), or if symptoms worsen or fail to improve, for back pain sciatica.  If you have any other questions or concerns, please feel free to call the office or send a message through MyChart. You may also schedule an earlier appointment if necessary.  Additionally,  you may be receiving a survey about your experience at our office within a few days to 1 week by e-mail or mail. We value your feedback.  Saralyn Pilar, DO [facility], [place]

## 2018-09-24 ENCOUNTER — Ambulatory Visit (INDEPENDENT_AMBULATORY_CARE_PROVIDER_SITE_OTHER): Payer: BC Managed Care – PPO | Admitting: Vascular Surgery

## 2018-09-24 ENCOUNTER — Encounter (INDEPENDENT_AMBULATORY_CARE_PROVIDER_SITE_OTHER): Payer: Self-pay

## 2018-09-28 ENCOUNTER — Ambulatory Visit (INDEPENDENT_AMBULATORY_CARE_PROVIDER_SITE_OTHER): Payer: BC Managed Care – PPO | Admitting: Vascular Surgery

## 2018-09-28 ENCOUNTER — Encounter (INDEPENDENT_AMBULATORY_CARE_PROVIDER_SITE_OTHER): Payer: Self-pay

## 2018-10-05 ENCOUNTER — Other Ambulatory Visit: Payer: Self-pay | Admitting: Family Medicine

## 2018-10-05 DIAGNOSIS — M5442 Lumbago with sciatica, left side: Secondary | ICD-10-CM

## 2018-10-13 ENCOUNTER — Encounter (INDEPENDENT_AMBULATORY_CARE_PROVIDER_SITE_OTHER): Payer: Self-pay | Admitting: Vascular Surgery

## 2019-01-03 ENCOUNTER — Other Ambulatory Visit: Payer: Self-pay

## 2019-01-03 DIAGNOSIS — Z20822 Contact with and (suspected) exposure to covid-19: Secondary | ICD-10-CM

## 2019-01-03 DIAGNOSIS — R6889 Other general symptoms and signs: Secondary | ICD-10-CM | POA: Diagnosis not present

## 2019-01-04 ENCOUNTER — Ambulatory Visit (INDEPENDENT_AMBULATORY_CARE_PROVIDER_SITE_OTHER): Payer: BC Managed Care – PPO | Admitting: Vascular Surgery

## 2019-01-05 LAB — NOVEL CORONAVIRUS, NAA: SARS-CoV-2, NAA: NOT DETECTED

## 2019-01-21 ENCOUNTER — Other Ambulatory Visit: Payer: Self-pay

## 2019-01-21 ENCOUNTER — Ambulatory Visit (INDEPENDENT_AMBULATORY_CARE_PROVIDER_SITE_OTHER): Payer: Worker's Compensation | Admitting: Vascular Surgery

## 2019-01-21 ENCOUNTER — Encounter (INDEPENDENT_AMBULATORY_CARE_PROVIDER_SITE_OTHER): Payer: Self-pay | Admitting: Vascular Surgery

## 2019-01-21 VITALS — BP 179/120 | HR 85 | Resp 16 | Wt 156.0 lb

## 2019-01-21 DIAGNOSIS — S55102D Unspecified injury of radial artery at forearm level, left arm, subsequent encounter: Secondary | ICD-10-CM

## 2019-01-21 DIAGNOSIS — I10 Essential (primary) hypertension: Secondary | ICD-10-CM

## 2019-01-21 DIAGNOSIS — E119 Type 2 diabetes mellitus without complications: Secondary | ICD-10-CM

## 2019-01-21 NOTE — Progress Notes (Signed)
MRN : [id number]  Scott [name] is a 58 y.o. ([birth year]) male who presents with chief complaint of  Chief Complaint  Patient presents with  . Follow-up  .  History of Present Illness: Patient returns today in follow up of his left arm.  About 6 months ago, he had a traumatic injury involving a laceration of his left radial artery with major hemorrhage.  After forearm exploration ultimately we ended up ligating his radial artery as the ulnar was providing good perfusion to the hand and the multiple locations of the injury and the radial artery made primary repair impossible.  He did well.  He had some mild wound issues the first couple of weeks which healed up.  He has been back to work without restrictions.  He has no complaints in his hand.  No pain or numbness.  His incision is well-healed today  Current Outpatient Medications  Medication Sig Dispense Refill  . aspirin EC 81 MG EC tablet Take 1 tablet (81 mg total) by mouth daily. 90 tablet 3  . atorvastatin (LIPITOR) 20 MG tablet TAKE 1 TABLET(20 MG) BY MOUTH DAILY (Patient taking differently: Take 20 mg by mouth at bedtime. ) 30 tablet 0  . lisinopril (PRINIVIL,ZESTRIL) 10 MG tablet Take 1 tablet (10 mg total) by mouth daily. (Patient taking differently: Take 10 mg by mouth at bedtime. ) 90 tablet 1  . metFORMIN (GLUCOPHAGE) 500 MG tablet Take 1 tablet (500 mg total) by mouth daily with breakfast. (Patient taking differently: Take 500 mg by mouth at bedtime. ) 90 tablet 3  . naproxen (NAPROSYN) 500 MG tablet TAKE 1 TABLET(500 MG) BY MOUTH TWICE DAILY WITH A MEAL FOR 1 TO 2 WEEKS THEN AS NEEDED 60 tablet 0  . predniSONE (DELTASONE) 10 MG tablet Take 6 tabs with breakfast Day 1, 5 tabs Day 2, 4 tabs Day 3, 3 tabs Day 4, 2 tabs Day 5, 1 tab Day 6. 21 tablet 0   No current facility-administered medications for this visit.     Past Medical History:  Diagnosis Date  . Allergy   . Diabetes mellitus without complication (HCC)    pt.  stated "I don't have diabetes", says last time he was in hospital the dx changed  . History of kidney stones    H/O STONES  . Hypertension     Past Surgical History:  Procedure Laterality Date  . APPENDECTOMY    . CHOLECYSTECTOMY    . COLONOSCOPY WITH PROPOFOL N/A 06/24/2017   Procedure: COLONOSCOPY WITH PROPOFOL;  Surgeon: Earline MayotteByrnett, Jeffrey W, MD;  Location: [facility] ENDOSCOPY;  Service: Endoscopy;  Laterality: N/A;  . LAPAROTOMY N/A 02/05/2016   Procedure: EXPLORATORY LAPAROTOMY FOR SMALL BOWEL OBSTRUCTION;  Surgeon: Kieth BrightlySeeplaputhur G Sankar, MD;  Location: [facility] ORS;  Service: General;  Laterality: N/A;  . LYSIS OF ADHESION  02/05/2016   Procedure: LYSIS OF ADHESION;  Surgeon: Kieth BrightlySeeplaputhur G Sankar, MD;  Location: [facility] ORS;  Service: General;;  . SMALL INTESTINE SURGERY  2005   Excision of Meckel's diverticulum  . VEIN REPAIR Left 07/28/2018   Procedure: RADIAL ARTERY LIGATION LEFT;  Surgeon: Annice Needyew, Jason S, MD;  Location: [facility] ORS;  Service: Vascular;  Laterality: Left;    Social History Social History   Tobacco Use  . Smoking status: Never Smoker  . Smokeless tobacco: Never Used  Substance Use Topics  . Alcohol use: Yes    Alcohol/week: 0.0 standard drinks    Comment: occasional  . Drug use: No  Family History Family History  Problem Relation Age of Onset  . Heart disease Mother   . Hypertension Mother   . Diabetes Mother   . Diabetes Sister   . Diabetes Brother     No Known Allergies   REVIEW OF SYSTEMS (Negative unless checked)  Constitutional: [] Weight loss  [] Fever  [] Chills Cardiac: [] Chest pain   [] Chest pressure   [] Palpitations   [] Shortness of breath when laying flat   [] Shortness of breath at rest   [] Shortness of breath with exertion. Vascular:  [] Pain in legs with walking   [] Pain in legs at rest   [] Pain in legs when laying flat   [] Claudication   [] Pain in feet when walking  [] Pain in feet at rest  [] Pain in feet when laying flat   [] History of DVT    [] Phlebitis   [] Swelling in legs   [] Varicose veins   [] Non-healing ulcers Pulmonary:   [] Uses home oxygen   [] Productive cough   [] Hemoptysis   [] Wheeze  [] COPD   [] Asthma Neurologic:  [] Dizziness  [] Blackouts   [] Seizures   [] History of stroke   [] History of TIA  [] Aphasia   [] Temporary blindness   [] Dysphagia   [] Weakness or numbness in arms   [] Weakness or numbness in legs Musculoskeletal:  [] Arthritis   [] Joint swelling   [] Joint pain   [] Low back pain Hematologic:  [] Easy bruising  [] Easy bleeding   [] Hypercoagulable state   [] Anemic   Gastrointestinal:  [] Blood in stool   [] Vomiting blood  [] Gastroesophageal reflux/heartburn   [] Abdominal pain Genitourinary:  [] Chronic kidney disease   [x] Difficult urination  [] Frequent urination  [] Burning with urination   [] Hematuria Skin:  [] Rashes   [] Ulcers   [] Wounds Psychological:  [] History of anxiety   []  History of major depression.  Physical Examination  BP (!) 179/120 (BP Location: Left Arm)   Pulse 85   Resp 16   Wt 156 lb (70.8 kg)   BMI 24.43 kg/m  Gen:  WD/WN, NAD Head: [place]/AT, No temporalis wasting. Ear/Nose/Throat: Hearing grossly intact, nares w/o erythema or drainage Eyes: Conjunctiva clear. Sclera non-icteric Neck: Supple.  Trachea midline Pulmonary:  Good air movement, no use of accessory muscles.  Cardiac: RRR, no JVD Vascular:  Vessel Right Left  Radial  2+ palpable  not palpable                   Musculoskeletal: M/S 5/5 throughout.  No deformity or atrophy.  No significant edema. Neurologic: Sensation grossly intact in extremities.  Symmetrical.  Speech is fluent.  Psychiatric: Judgment intact, Mood & affect appropriate for pt's clinical situation. Dermatologic: No rashes or ulcers noted.  No cellulitis or open wounds.       Labs Recent Results (from the past 2160 hour(s))  Novel Coronavirus, NAA ([facility])     Status: None   Collection Time: 01/03/19 12:00 AM   Specimen: Oropharyngeal(OP) collection in  vial transport medium   OROPHARYNGEA  TESTING  Result Value Ref Range   SARS-CoV-2, NAA Not Detected Not Detected    Comment: This nucleic acid amplification test was developed and its performance characteristics determined by [employer]. Nucleic acid amplification tests include PCR and TMA. This test has not been FDA cleared or approved. This test has been authorized by FDA under an Emergency Use Authorization (EUA). This test is only authorized for the duration of time the declaration that circumstances exist justifying the authorization of the emergency use of in vitro diagnostic tests for detection  of SARS-CoV-2 virus and/or diagnosis of COVID-19 infection under section 564(b)(1) of the Act, 21 U.S.C. 829FAO-1(H) (1), unless the authorization is terminated or revoked sooner. When diagnostic testing is negative, the possibility of a false negative result should be considered in the context of a patient's recent exposures and the presence of clinical signs and symptoms consistent with COVID-19. An individual without symptoms of COVID-19 and who is not shedding SARS-CoV-2 virus would  expect to have a negative (not detected) result in this assay.     Radiology No results found.  Assessment/Plan  Hypertension blood pressure control important in reducing the progression of atherosclerotic disease. On appropriate oral medications.   Controlled diabetes mellitus type II without complication (HCC) blood glucose control important in reducing the progression of atherosclerotic disease. Also, involved in wound healing. On appropriate medications.   Injury of radial artery, left, subsequent encounter Patient is doing well.  He is completely recovered and has no restrictions or limitations after his left radial artery injury about 6 months ago.  He may resume all normal activities.  He can return to our office as needed.    Festus Barren, MD  01/21/2019 12:54 PM    This  note was created with Dragon medical transcription system.  Any errors from dictation are purely unintentional

## 2019-01-21 NOTE — Assessment & Plan Note (Signed)
blood pressure control important in reducing the progression of atherosclerotic disease. On appropriate oral medications.  

## 2019-01-21 NOTE — Assessment & Plan Note (Signed)
Patient is doing well.  He is completely recovered and has no restrictions or limitations after his left radial artery injury about 6 months ago.  He may resume all normal activities.  He can return to our office as needed.

## 2019-01-21 NOTE — Assessment & Plan Note (Signed)
blood glucose control important in reducing the progression of atherosclerotic disease. Also, involved in wound healing. On appropriate medications.  

## 2019-01-27 ENCOUNTER — Ambulatory Visit (INDEPENDENT_AMBULATORY_CARE_PROVIDER_SITE_OTHER): Payer: Self-pay | Admitting: Nurse Practitioner

## 2019-01-27 ENCOUNTER — Encounter: Payer: Self-pay | Admitting: Nurse Practitioner

## 2019-01-27 ENCOUNTER — Other Ambulatory Visit: Payer: Self-pay

## 2019-01-27 VITALS — BP 154/97 | HR 98 | Ht 67.0 in | Wt 159.4 lb

## 2019-01-27 DIAGNOSIS — I10 Essential (primary) hypertension: Secondary | ICD-10-CM

## 2019-01-27 DIAGNOSIS — L608 Other nail disorders: Secondary | ICD-10-CM

## 2019-01-27 DIAGNOSIS — N529 Male erectile dysfunction, unspecified: Secondary | ICD-10-CM

## 2019-01-27 DIAGNOSIS — E119 Type 2 diabetes mellitus without complications: Secondary | ICD-10-CM

## 2019-01-27 DIAGNOSIS — B351 Tinea unguium: Secondary | ICD-10-CM

## 2019-01-27 LAB — POCT GLYCOSYLATED HEMOGLOBIN (HGB A1C): Hemoglobin A1C: 7.2 % — AB (ref 4.0–5.6)

## 2019-01-27 MED ORDER — LISINOPRIL 10 MG PO TABS: 10.0000 mg | ORAL_TABLET | Freq: Every day | ORAL | 1 refills | Status: AC

## 2019-01-27 MED ORDER — METFORMIN HCL 500 MG PO TABS: 500.0000 mg | ORAL_TABLET | Freq: Two times a day (BID) | ORAL | 1 refills | Status: AC

## 2019-01-27 MED ORDER — SILDENAFIL CITRATE 20 MG PO TABS: 20.0000 mg | ORAL_TABLET | Freq: Once | ORAL | 1 refills | Status: DC

## 2019-01-27 MED ORDER — ATORVASTATIN CALCIUM 20 MG PO TABS: ORAL_TABLET | ORAL | 1 refills | Status: DC

## 2019-01-27 NOTE — Progress Notes (Signed)
Subjective:    Patient ID: Scott [name], male    DOB: Jun 21, 1960, 58 y.o.   MRN: [id number]  Scott [name] is a 58 y.o. male presenting on 01/27/2019 for Annual Exam (pt been completely out medication x 2 mth) and Erectile Dysfunction (pt requesting a prescription for sildenafil)   HPI Diabetes Pt presents today for follow up of Type 2 diabetes mellitus. He is not checking CBG at home - Current diabetic medications include: no medications for about 3 mos - He is not currently symptomatic.  - He denies polydipsia, polyphagia, polyuria, headaches, diaphoresis, shakiness, chills, pain, numbness or tingling in extremities and changes in vision.   - Clinical course has been worsening. - He  reports no regular exercise routine outside of work/work around the hous. - His diet is moderate in salt, moderate in fat, and moderate in carbohydrates.  Restaurant meals about 1 time per week. - Weight trend: stable  PREVENTION: Eye exam current (within one year): no Foot exam current (within one year): no  Lipid/ASCVD risk reduction - on statin: atorvastatin in past Kidney protection - on ace or arb: lisinopril Recent Labs    01/27/19 1529  HGBA1C 7.2*   Hypertension - He is not checking BP at home or outside of clinic.    - Current medications: lisinopril 10 mg once daily in past.  Off medications x 3 months.  Previously, patient was tolerating well without side effects - He is not currently symptomatic. - Pt denies headache, lightheadedness, dizziness, changes in vision, chest tightness/pressure, palpitations, leg swelling, sudden loss of speech or loss of consciousness.  Erectile Dysfunction Never taken medications in past. Problems started about 6 months ago.  This worsened with increased stress, patient believes.  Patient is able to obtain partial erection, only sometimes sufficient for penetration.  Patient also sometimes has difficulty with duration of erection for intercourse.    - No full erection in last 3-4 months.  Desires to start medication if possible.  Social History   Tobacco Use  . Smoking status: Never Smoker  . Smokeless tobacco: Never Used  Substance Use Topics  . Alcohol use: Yes    Alcohol/week: 0.0 standard drinks    Comment: occasional  . Drug use: No    Review of Systems Per HPI unless specifically indicated above     Objective:    BP (!) 154/97 (BP Location: Right Arm, Patient Position: Sitting, Cuff Size: Normal)   Pulse 98   Ht 5\' 7"  (1.702 m)   Wt 159 lb 6.4 oz (72.3 kg)   BMI 24.97 kg/m   Wt Readings from Last 3 Encounters:  01/27/19 159 lb 6.4 oz (72.3 kg)  01/21/19 156 lb (70.8 kg)  09/07/18 162 lb (73.5 kg)    Physical Exam Vitals signs reviewed.  Constitutional:      General: He is not in acute distress.    Appearance: He is well-developed. He is obese.  HENT:     Head: Normocephalic and atraumatic.  Neck:     Musculoskeletal: Normal range of motion and neck supple.     Vascular: No carotid bruit.  Cardiovascular:     Rate and Rhythm: Normal rate and regular rhythm.     Pulses: Normal pulses.     Heart sounds: Normal heart sounds, S1 normal and S2 normal. No murmur. No friction rub. No gallop.   Pulmonary:     Effort: Pulmonary effort is normal. No respiratory distress.  Breath sounds: Normal breath sounds.  Abdominal:     General: Bowel sounds are normal.     Palpations: Abdomen is soft.     Comments: Protuberant  Skin:    General: Skin is warm and dry.     Capillary Refill: Capillary refill takes less than 2 seconds.  Neurological:     General: No focal deficit present.     Mental Status: He is alert and oriented to person, place, and time. Mental status is at baseline.  Psychiatric:        Mood and Affect: Mood normal.        Behavior: Behavior normal.        Thought Content: Thought content normal.        Judgment: Judgment normal.    Diabetic Foot Exam - Simple   Simple Foot Form Diabetic  Foot exam was performed with the following findings: Yes 01/28/2019  4:00 PM  Visual Inspection See comments: Yes Sensation Testing Intact to touch and monofilament testing bilaterally: Yes Pulse Check Posterior Tibialis and Dorsalis pulse intact bilaterally: Yes Comments Patient with incurvated nail RIGHT 2nd digit.  Thickened nails with onychomycosis bilateral great toes with possible ingrown nail RIGHT great toe without complication (erythema, redness, swelling, drainage).  No current ulcerations or skin breakdown     Results for orders placed or performed in visit on 01/27/19  POCT glycosylated hemoglobin (Hb A1C)  Result Value Ref Range   Hemoglobin A1C 7.2 (A) 4.0 - 5.6 %   HbA1c POC (<> result, manual entry)     HbA1c, POC (prediabetic range)     HbA1c, POC (controlled diabetic range)         Assessment & Plan:   Problem List Items Addressed This Visit      Cardiovascular and Mediastinum   Hypertension Currently uncontrolled hypertension off medications.  BP goal < 130/80.  Pt is not currently working on lifestyle modifications.  Taking medications tolerating well without side effects. Complications:   Plan: 1. Resume meds 2. Obtain labs   3. Encouraged heart healthy diet and increasing exercise to 30 minutes most days of the week. 4. Check BP 1-2 x per week at home, keep log, and bring to clinic at next appointment. 5. Follow up 3 months.     Relevant Medications   lisinopril (ZESTRIL) 10 MG tablet   atorvastatin (LIPITOR) 20 MG tablet   sildenafil (REVATIO) 20 MG tablet     Endocrine   Controlled diabetes mellitus type II without complication (HCC) - Primary UncontrolledDM with A1c Worsening control from last visit and goal A1c < 7.0%. - Complications - dyslipidemia, peripheral neuropathy and hyperglycemia.  Plan:  1. Continue current therapy: resume meds, increase metformin to bid 2. Encourage improved lifestyle: - low carb/low glycemic diet reinforced  prior education - Increase physical activity to 30 minutes most days of the week.  Explained that increased physical activity increases body's use of sugar for energy. 3. Check fasting am CBG and bring log to next visit for review 4. Continue ASA, ACEi and Statin 5. DM Foot exam done today with abnormal nail findings.   and Advised to schedule DM ophtho exam, send record. 6. Follow-up 3 mos    Relevant Medications   metFORMIN (GLUCOPHAGE) 500 MG tablet   lisinopril (ZESTRIL) 10 MG tablet   atorvastatin (LIPITOR) 20 MG tablet   Other Relevant Orders   POCT glycosylated hemoglobin (Hb A1C) (Completed)    Other Visit Diagnoses    Erectile dysfunction,  unspecified erectile dysfunction type     New complaint, likely vasculogenic and worsening with poor dm control.  Never on mediations, but desires to start today.  - START sildenafil 20 mg tablet.  Take 1-3 tablets prior to sexual intercourse for obtaining an erection.  Start w/ one tablet at first dose, then increase to 3 tablets total per dose as needed if unable to have an erection.   - Avoid all nitrates/nitroglycerin for any chest pain emergencies if using this medication.  - Priapism or having an erection lasting longer than 4 hours is an emergency condition possible when taking this medication.  Seek care in ED if this occurs. - Follow-up 3 months.   Relevant Medications   sildenafil (REVATIO) 20 MG tablet      Meds ordered this encounter  Medications  . metFORMIN (GLUCOPHAGE) 500 MG tablet    Sig: Take 1 tablet (500 mg total) by mouth 2 (two) times daily with a meal.    Dispense:  180 tablet    Refill:  1    Order Specific Question:   Supervising Provider    Answer:   Smitty Cords [[id number]]  . lisinopril (ZESTRIL) 10 MG tablet    Sig: Take 1 tablet (10 mg total) by mouth daily.    Dispense:  90 tablet    Refill:  1    Order Specific Question:   Supervising Provider    Answer:   Smitty Cords [[id number]]  .  atorvastatin (LIPITOR) 20 MG tablet    Sig: TAKE 1 TABLET(20 MG) BY MOUTH DAILY    Dispense:  90 tablet    Refill:  1    Order Specific Question:   Supervising Provider    Answer:   Smitty Cords [[id number]]  . sildenafil (REVATIO) 20 MG tablet    Sig: Take 1-3 tablets (20-60 mg total) by mouth once for 1 dose.    Dispense:  30 tablet    Refill:  1    Order Specific Question:   Supervising Provider    Answer:   Smitty Cords [[id number]]   Follow up plan: Return in about 4 months (around 05/30/2019) for hypertension, diabetes.  Wilhelmina Mcardle, DNP, AGPCNP-BC Adult Gerontology Primary Care Nurse Practitioner [facility] 01/27/2019, 3:32 PM

## 2019-01-27 NOTE — Patient Instructions (Addendum)
Scott [name],   Thank you for coming in to clinic today.  1. Restart medications.  - Take metformin twice daily.  This is one more dose than in past. - All other medications are without change.  2. For your erectile dysfunction, take sildenafil as needed prior to sex.  Be careful for taking nitrates (in emergencies) if you have taken this medication within the last 24-48 hours.  3. You will be due for FASTING BLOOD WORK.  This means you should eat no food or drink after midnight.  Drink only water or coffee without cream/sugar on the morning of your lab visit. - Please go ahead and schedule a "Lab Only" visit in the morning at the clinic for lab draw in the next 7 days. - Your results will be available about 2-3 days after blood draw.  If you have set up a MyChart account, you can can log in to MyChart online to view your results and a brief explanation. Also, we can discuss your results together at your next office visit if you would like.  Please schedule a follow-up appointment. Return in about 4 months (around 05/30/2019) for hypertension, diabetes.  If you have any other questions or concerns, please feel free to call the clinic or send a message through [place]. You may also schedule an earlier appointment if necessary.  You will receive a survey after today's visit either digitally by e-mail or paper by [employer]. Your experiences and feedback matter to [place].  Please respond so we know how we are doing as we provide care for you.  Cassell Smiles, DNP, AGNP-BC Adult Gerontology Nurse Practitioner [place]

## 2019-01-27 NOTE — Progress Notes (Signed)
Scott [name]  

## 2019-01-31 ENCOUNTER — Other Ambulatory Visit: Payer: Self-pay | Admitting: Nurse Practitioner

## 2019-01-31 DIAGNOSIS — N529 Male erectile dysfunction, unspecified: Secondary | ICD-10-CM

## 2019-01-31 MED ORDER — SILDENAFIL CITRATE 20 MG PO TABS: 20.0000 mg | ORAL_TABLET | ORAL | 1 refills | Status: DC | PRN

## 2019-02-01 DIAGNOSIS — I10 Essential (primary) hypertension: Secondary | ICD-10-CM | POA: Diagnosis not present

## 2019-02-01 DIAGNOSIS — E119 Type 2 diabetes mellitus without complications: Secondary | ICD-10-CM | POA: Diagnosis not present

## 2019-02-02 LAB — LIPID PANEL
Cholesterol: 89 mg/dL (ref <200)
HDL: 34 mg/dL — ABNORMAL LOW (ref >=40)
LDL Cholesterol (Calc): 38 mg/dL (calc)
Non-HDL Cholesterol (Calc): 55 mg/dL (calc) (ref <130)
Total CHOL/HDL Ratio: 2.6 (calc) (ref <5.0)
Triglycerides: 85 mg/dL (ref <150)

## 2019-02-02 LAB — COMPREHENSIVE METABOLIC PANEL
AG Ratio: 1.7 (calc) (ref 1.0–2.5)
ALT: 27 U/L (ref 9–46)
AST: 23 U/L (ref 10–35)
Albumin: 4.3 g/dL (ref 3.6–5.1)
Alkaline phosphatase (APISO): 54 U/L (ref 35–144)
BUN: 12 mg/dL (ref 7–25)
CO2: 31 mmol/L (ref 20–32)
Calcium: 9.6 mg/dL (ref 8.6–10.3)
Chloride: 103 mmol/L (ref 98–110)
Creat: 0.82 mg/dL (ref 0.70–1.33)
Globulin: 2.6 g/dL (calc) (ref 1.9–3.7)
Glucose, Bld: 134 mg/dL — ABNORMAL HIGH (ref 65–99)
Potassium: 4.7 mmol/L (ref 3.5–5.3)
Sodium: 140 mmol/L (ref 135–146)
Total Bilirubin: 0.8 mg/dL (ref 0.2–1.2)
Total Protein: 6.9 g/dL (ref 6.1–8.1)

## 2019-02-07 ENCOUNTER — Ambulatory Visit: Payer: BC Managed Care – PPO | Admitting: Podiatry

## 2019-02-14 ENCOUNTER — Ambulatory Visit: Payer: BC Managed Care – PPO | Admitting: Podiatry

## 2019-02-17 ENCOUNTER — Other Ambulatory Visit: Payer: Self-pay

## 2019-02-17 ENCOUNTER — Encounter: Payer: Self-pay | Admitting: Podiatry

## 2019-02-17 ENCOUNTER — Ambulatory Visit (INDEPENDENT_AMBULATORY_CARE_PROVIDER_SITE_OTHER): Payer: BC Managed Care – PPO | Admitting: Podiatry

## 2019-02-17 DIAGNOSIS — E119 Type 2 diabetes mellitus without complications: Secondary | ICD-10-CM | POA: Insufficient documentation

## 2019-02-17 DIAGNOSIS — M79675 Pain in left toe(s): Secondary | ICD-10-CM | POA: Diagnosis not present

## 2019-02-17 DIAGNOSIS — M79674 Pain in right toe(s): Secondary | ICD-10-CM

## 2019-02-17 DIAGNOSIS — B351 Tinea unguium: Secondary | ICD-10-CM

## 2019-02-17 NOTE — Progress Notes (Signed)
This patient presents to the office with chief complaint of long thick big  nails and diabetic feet.  This patient  says there  is  no pain and discomfort in his  feet.  This patient says there are long thick painful  big nails.  These nails are painful walking and wearing shoes.  Patient has no history of infection or drainage from both feet.  Patient is unable to  self treat his own nails . This patient presents  to the office today for treatment of the  long nails and a foot evaluation due to history of  diabetes.  General Appearance  Alert, conversant and in no acute stress.  Vascular  Dorsalis pedis and posterior tibial  pulses are palpable  bilaterally.  Capillary return is within normal limits  bilaterally. Temperature is within normal limits  bilaterally.  Neurologic  Senn-Weinstein monofilament wire test within normal limits  bilaterally. Muscle power within normal limits bilaterally.  Nails Thick disfigured discolored nails with subungual debris  from hallux to fifth toes bilaterally. No evidence of bacterial infection or drainage bilaterally.  Orthopedic  No limitations of motion of motion feet .  No crepitus or effusions noted.  No bony pathology or digital deformities noted.  Cavus foot type.  Skin  normotropic skin with no porokeratosis noted bilaterally.  No signs of infections or ulcers noted.     Onychomycosis  Diabetes with no foot complications  IE  Debride nails x 2.  A diabetic foot exam was performed and there is no evidence of any vascular or neurologic pathology.   RTC 1 year   Gardiner Barefoot DPM

## 2019-03-24 ENCOUNTER — Other Ambulatory Visit: Payer: Self-pay

## 2019-03-24 DIAGNOSIS — Z20822 Contact with and (suspected) exposure to covid-19: Secondary | ICD-10-CM

## 2019-03-26 LAB — NOVEL CORONAVIRUS, NAA: SARS-CoV-2, NAA: NOT DETECTED

## 2019-03-28 ENCOUNTER — Ambulatory Visit: Payer: BC Managed Care – PPO | Admitting: Podiatry

## 2019-04-27 ENCOUNTER — Other Ambulatory Visit: Payer: Self-pay

## 2019-04-27 ENCOUNTER — Ambulatory Visit (INDEPENDENT_AMBULATORY_CARE_PROVIDER_SITE_OTHER): Payer: Self-pay | Admitting: Family Medicine

## 2019-04-27 ENCOUNTER — Encounter: Payer: Self-pay | Admitting: Family Medicine

## 2019-04-27 VITALS — BP 117/72 | HR 80 | Temp 97.7°F | Ht 67.0 in | Wt 158.6 lb

## 2019-04-27 DIAGNOSIS — M7552 Bursitis of left shoulder: Secondary | ICD-10-CM

## 2019-04-27 DIAGNOSIS — M25512 Pain in left shoulder: Secondary | ICD-10-CM

## 2019-04-27 MED ORDER — CYCLOBENZAPRINE HCL 10 MG PO TABS: 5.0000 mg | ORAL_TABLET | Freq: Every evening | ORAL | 0 refills | Status: DC | PRN

## 2019-04-27 MED ORDER — NAPROXEN 500 MG PO TABS: 500.0000 mg | ORAL_TABLET | Freq: Two times a day (BID) | ORAL | 1 refills | Status: DC

## 2019-04-27 NOTE — Patient Instructions (Addendum)
Thank you for coming to the office today.  Most likely you have bursitis of your shoulder. This is inflammation of the shoulder joint caused most often by arthritis or wear and tear. Often it can flare up to cause bursitis due to repetitive activities or other triggers. It may take time to heal, possibly 2 to 6 weeks, and it is important to avoid over use of shoulder especially above head motions that can re-aggravate the problem.  Recommend trial of Anti-inflammatory with Naproxen (Naprosyn) 500mg  tabs - take one with food and plenty of water TWICE daily every day (breakfast and dinner), for next 2 to 4 weeks, then you may take only as needed  - DO NOT TAKE any ibuprofen, aleve, motrin while you are taking this medicine  Start Cyclobenzapine (Flexeril) 10mg  tablets (muscle relaxant) - start with half (cut) to one whole pill at night for muscle relaxant - may make you sedated or sleepy (be careful driving or working on this) if tolerated you can take half to whole tab 2 to 3 times daily or every 8 hours as needed   Recommend to start taking Tylenol Extra Strength 500mg  tabs - take 1 to 2 tabs per dose (max 1000mg ) every 6-8 hours for pain (take regularly, don't skip a dose for next 7 days), max 24 hour daily dose is 6 tablets or 3000mg . In the future you can repeat the same everyday Tylenol course for 1-2 weeks at a time.   Also we can refer you to Physical Therapy if just need to improve on range of motion.  Lastly if not improved by about 4-6 weeks - or just need treatment sooner - call and return for a Steroid Injection in shoulder.   Please schedule a Follow-up Appointment to: Return in about 4 weeks (around 05/25/2019), or if symptoms worsen or fail to improve, for shoulder pain.  If you have any other questions or concerns, please feel free to call the office or send a message through MyChart. You may also schedule an earlier appointment if necessary.  Additionally, you may be receiving a  survey about your experience at our office within a few days to 1 week by e-mail or mail. We value your feedback.  , DO [facility], [facility]  Range of Motion Shoulder Exercises  Pendulum Circles - Lean with your good arm against a counter or table for support [facility] forward with a wide stance (make sure your body is comfortable) - Your painful shoulder should hang down and feel "heavy" - Gently move your painful arm in small circles "clockwise" for several turns - Switch to "counterclockwise" for several turns - Early on keep circles narrow and move slowly - Later in rehab, move in larger circles and faster movement   Wall Crawl - Stand close (about 1-2 ft away) to a wall, facing it directly - Reach out with your arm of painful shoulder and place fingers (not palm) on wall - You should make contact with wall at your waist level - Slowly walk your fingers up the wall. Stay in contact with wall entire time, do not remove fingers - Keep walking fingers up wall until you reach shoulder level - You may feel tightening or mild discomfort, once you reach a height that causes pain or if you are already above your shoulder height then stop. Repeat from starting position. - Early on stand closer to wall, move fingers slowly, and stay at or below shoulder level - Later  in rehab, stand farther away from wall (fingertips), move fingers quicker, go above shoulder level

## 2019-04-27 NOTE — Progress Notes (Signed)
Subjective:    Patient ID: Scott [name], male    DOB: 01/04/1961, 59 y.o.   MRN: [id number]  [name] is a 59 y.o. male presenting on 04/27/2019 for Shoulder Pain (left shoulder pain w/ difficulty with ROM and mild headache x 12 hrs )   HPI   Left Shoulder Pain, Acute, Bursitis New problem Left shoulder pain, with some radiating symptoms occasional tingling into forearm, onset over past 1 day approximately, yesterday he was lifting Boxes 40-50 lbs, and has done repetitive activity, recent worsening problem now with shoulder pain, it did not hurt immediately after lifting, seemed to gradual develop. He has soreness with lifting arm out now causing discomfort and pain, worse at night occasionally. Not taking any medicine for it except one dose of BC some relief No prior shoulder injury Denies any other joint pain or swelling Admits headache   Depression screen [facility] 2/9 09/07/2018 06/10/2018 10/06/2017  Decreased Interest 0 0 0  Down, Depressed, Hopeless 0 0 0  PHQ - 2 Score 0 0 0    Social History   Tobacco Use  . Smoking status: Never Smoker  . Smokeless tobacco: Never Used  Substance Use Topics  . Alcohol use: Not Currently    Alcohol/week: 0.0 standard drinks    Comment: occasional  . Drug use: No    Review of Systems Per HPI unless specifically indicated above     Objective:    BP 117/72 (BP Location: Left Arm, Patient Position: Sitting, Cuff Size: Normal)   Pulse 80   Temp 97.7 F (36.5 C) (Oral)   Ht 5\' 7"  (1.702 m)   Wt 158 lb 9.6 oz (71.9 kg)   BMI 24.84 kg/m   Wt Readings from Last 3 Encounters:  04/27/19 158 lb 9.6 oz (71.9 kg)  01/27/19 159 lb 6.4 oz (72.3 kg)  01/21/19 156 lb (70.8 kg)    Physical Exam Vitals and nursing note reviewed.  Constitutional:      General: He is not in acute distress.    Appearance: He is well-developed. He is not diaphoretic.     Comments: Well-appearing, comfortable, cooperative  HENT:     Head:  Normocephalic and atraumatic.  Eyes:     General:        Right eye: No discharge.        Left eye: No discharge.     Conjunctiva/sclera: Conjunctivae normal.  Cardiovascular:     Rate and Rhythm: Normal rate.  Pulmonary:     Effort: Pulmonary effort is normal.  Musculoskeletal:     Comments: Left Shoulder Inspection: Normal appearance bilateral symmetrical Palpation: Non-tender to palpation over anterior, lateral, or posterior shoulder  ROM: Reduced active ROM forward flex abduction and internal rotation, up to 90* approx due to pain Special Testing: Rotator cuff testing negative for weakness with supraspinatus full can and empty can test but positive for some pain POSITIVE impingement testing and negative AC strength testing Strength: Normal strength 5/5 flex/ext, ext rot / int rot, grip, rotator cuff str testing. Neurovascular: Distally intact pulses, sensation to light touch  Skin:    General: Skin is warm and dry.     Findings: No erythema or rash.  Neurological:     Mental Status: He is alert and oriented to person, place, and time.  Psychiatric:        Behavior: Behavior normal.     Comments: Well groomed, good eye contact, normal speech and thoughts  Results for orders placed or performed in visit on 03/24/19  Novel Coronavirus, NAA ([facility])   Specimen: Nasopharyngeal(NP) swabs in vial transport medium   NASOPHARYNGE  TESTING  Result Value Ref Range   SARS-CoV-2, NAA Not Detected Not Detected      Assessment & Plan:   Problem List Items Addressed This Visit    None    Visit Diagnoses    Acute bursitis of left shoulder    -  Primary   Relevant Medications   naproxen (NAPROSYN) 500 MG tablet   cyclobenzaprine (FLEXERIL) 10 MG tablet   Acute pain of left shoulder          Consistent with acute L-shoulder bursitis  with some reduced active ROM but without significant evidence of muscle tear (no weakness). Known repetitive overhead/strenuous activity as likely  etiology  Possible underlying osteoarthritis, no prior imaging available  Plan: 1. Start rx Naproxen 500mg  twice daily (with food) for 2 weeks, then as needed - Add Flexeril 5-10mg  nightly QHS PRN - caution sedation 2. May take Tylenol Ex Str 1-2 q 6 hr PRN 3. Relative rest but keep shoulder mobile, demonstrated ROM exercises, avoid heavy lifting 4. May try heating pad PRN 5. Follow-up 4-6 weeks if not improved for re-evaluation, consider referral to Physical Therapy, X-rays, and or subacromial steroid injection   Meds ordered this encounter  Medications  . naproxen (NAPROSYN) 500 MG tablet    Sig: Take 1 tablet (500 mg total) by mouth 2 (two) times daily with a meal. For 1-2 weeks then as needed    Dispense:  60 tablet    Refill:  1  . cyclobenzaprine (FLEXERIL) 10 MG tablet    Sig: Take 0.5-1 tablets (5-10 mg total) by mouth at bedtime as needed for muscle spasms.    Dispense:  30 tablet    Refill:  0     Follow up plan: Return in about 4 weeks (around 05/25/2019), or if symptoms worsen or fail to improve, for shoulder pain.   07/23/2019, DO [facility] 04/27/2019, 10:25 AM

## 2019-05-06 ENCOUNTER — Other Ambulatory Visit: Payer: Self-pay | Admitting: Nurse Practitioner

## 2019-05-06 DIAGNOSIS — N529 Male erectile dysfunction, unspecified: Secondary | ICD-10-CM

## 2019-05-27 DIAGNOSIS — I10 Essential (primary) hypertension: Secondary | ICD-10-CM | POA: Diagnosis present

## 2019-05-27 DIAGNOSIS — E1169 Type 2 diabetes mellitus with other specified complication: Secondary | ICD-10-CM | POA: Diagnosis not present

## 2019-05-27 DIAGNOSIS — Z0000 Encounter for general adult medical examination without abnormal findings: Secondary | ICD-10-CM | POA: Diagnosis not present

## 2019-05-27 DIAGNOSIS — E782 Mixed hyperlipidemia: Secondary | ICD-10-CM | POA: Diagnosis not present

## 2019-07-02 ENCOUNTER — Ambulatory Visit: Payer: Self-pay | Attending: Internal Medicine

## 2019-07-02 DIAGNOSIS — Z23 Encounter for immunization: Secondary | ICD-10-CM

## 2019-07-02 NOTE — Progress Notes (Signed)
   Covid-19 Vaccination Clinic  Name:  SEQUAN AUXIER    MRN: [id number] DOB: 07-Nov-1960  07/02/2019  Mr. Mangual was observed post Covid-19 immunization for 15 minutes without incident. He was provided with Vaccine Information Sheet and instruction to access the V-Safe system.   Mr. Nanna was instructed to call 911 with any severe reactions post vaccine: Marland Kitchen Difficulty breathing  . Swelling of face and throat  . A fast heartbeat  . A bad rash all over body  . Dizziness and weakness   Immunizations Administered    Name Date Dose VIS Date Route   Pfizer COVID-19 Vaccine 07/02/2019  3:59 PM 0.3 mL 03/25/2019 Intramuscular   Manufacturer: [employer], [employer]   Lot: [id number]   NDC: [address]-8

## 2019-07-07 ENCOUNTER — Other Ambulatory Visit: Payer: Self-pay | Admitting: Family Medicine

## 2019-07-07 DIAGNOSIS — M7552 Bursitis of left shoulder: Secondary | ICD-10-CM

## 2019-07-23 ENCOUNTER — Ambulatory Visit: Payer: Self-pay | Attending: Internal Medicine

## 2019-07-23 DIAGNOSIS — Z23 Encounter for immunization: Secondary | ICD-10-CM

## 2019-07-23 NOTE — Progress Notes (Signed)
   Covid-19 Vaccination Clinic  Name:  Scott [name]    MRN: [id number] DOB: 12/12/1960  07/23/2019  Scott [name] was observed post Covid-19 immunization for 15 minutes without incident. He was provided with Vaccine Information Sheet and instruction to access the V-Safe system.   Scott [name] was instructed to call 911 with any severe reactions post vaccine: Marland Kitchen Difficulty breathing  . Swelling of face and throat  . A fast heartbeat  . A bad rash all over body  . Dizziness and weakness   Immunizations Administered    Name Date Dose VIS Date Route   Pfizer COVID-19 Vaccine 07/23/2019  3:40 PM 0.3 mL 03/25/2019 Intramuscular   Manufacturer: [employer], [employer]   Lot: [id number]   NDC: [address]-5

## 2019-09-01 ENCOUNTER — Other Ambulatory Visit (INDEPENDENT_AMBULATORY_CARE_PROVIDER_SITE_OTHER): Payer: Self-pay | Admitting: Vascular Surgery

## 2019-09-07 DIAGNOSIS — E538 Deficiency of other specified B group vitamins: Secondary | ICD-10-CM | POA: Diagnosis not present

## 2019-11-18 DIAGNOSIS — Z125 Encounter for screening for malignant neoplasm of prostate: Secondary | ICD-10-CM | POA: Diagnosis not present

## 2019-11-18 DIAGNOSIS — Z0000 Encounter for general adult medical examination without abnormal findings: Secondary | ICD-10-CM | POA: Diagnosis not present

## 2019-11-18 DIAGNOSIS — E1169 Type 2 diabetes mellitus with other specified complication: Secondary | ICD-10-CM | POA: Diagnosis not present

## 2019-11-18 DIAGNOSIS — E782 Mixed hyperlipidemia: Secondary | ICD-10-CM | POA: Diagnosis not present

## 2019-12-01 DIAGNOSIS — E782 Mixed hyperlipidemia: Secondary | ICD-10-CM | POA: Diagnosis not present

## 2019-12-01 DIAGNOSIS — E1169 Type 2 diabetes mellitus with other specified complication: Secondary | ICD-10-CM | POA: Diagnosis not present

## 2019-12-01 DIAGNOSIS — E538 Deficiency of other specified B group vitamins: Secondary | ICD-10-CM | POA: Diagnosis not present

## 2020-03-10 ENCOUNTER — Emergency Department: Payer: BC Managed Care – PPO

## 2020-03-10 ENCOUNTER — Other Ambulatory Visit: Payer: Self-pay

## 2020-03-10 ENCOUNTER — Inpatient Hospital Stay
Admission: EM | Admit: 2020-03-10 | Discharge: 2020-03-16 | DRG: 390 | Disposition: A | Discharge status: Home / Self Care | Payer: BC Managed Care – PPO | Attending: Internal Medicine | Admitting: Internal Medicine

## 2020-03-10 DIAGNOSIS — Z7984 Long term (current) use of oral hypoglycemic drugs: Secondary | ICD-10-CM | POA: Diagnosis not present

## 2020-03-10 DIAGNOSIS — Z79899 Other long term (current) drug therapy: Secondary | ICD-10-CM | POA: Diagnosis not present

## 2020-03-10 DIAGNOSIS — E119 Type 2 diabetes mellitus without complications: Secondary | ICD-10-CM | POA: Diagnosis not present

## 2020-03-10 DIAGNOSIS — R1110 Vomiting, unspecified: Secondary | ICD-10-CM | POA: Diagnosis not present

## 2020-03-10 DIAGNOSIS — Z9049 Acquired absence of other specified parts of digestive tract: Secondary | ICD-10-CM

## 2020-03-10 DIAGNOSIS — I10 Essential (primary) hypertension: Secondary | ICD-10-CM | POA: Diagnosis not present

## 2020-03-10 DIAGNOSIS — Z23 Encounter for immunization: Secondary | ICD-10-CM

## 2020-03-10 DIAGNOSIS — R000 Tachycardia, unspecified: Secondary | ICD-10-CM | POA: Diagnosis not present

## 2020-03-10 DIAGNOSIS — R109 Unspecified abdominal pain: Secondary | ICD-10-CM | POA: Diagnosis not present

## 2020-03-10 DIAGNOSIS — Z4682 Encounter for fitting and adjustment of non-vascular catheter: Secondary | ICD-10-CM | POA: Diagnosis not present

## 2020-03-10 DIAGNOSIS — Z7982 Long term (current) use of aspirin: Secondary | ICD-10-CM

## 2020-03-10 DIAGNOSIS — K6389 Other specified diseases of intestine: Secondary | ICD-10-CM | POA: Diagnosis not present

## 2020-03-10 DIAGNOSIS — Z20822 Contact with and (suspected) exposure to covid-19: Secondary | ICD-10-CM | POA: Diagnosis present

## 2020-03-10 DIAGNOSIS — E1165 Type 2 diabetes mellitus with hyperglycemia: Secondary | ICD-10-CM | POA: Diagnosis present

## 2020-03-10 DIAGNOSIS — D72828 Other elevated white blood cell count: Secondary | ICD-10-CM | POA: Diagnosis present

## 2020-03-10 DIAGNOSIS — E782 Mixed hyperlipidemia: Secondary | ICD-10-CM | POA: Diagnosis not present

## 2020-03-10 DIAGNOSIS — R112 Nausea with vomiting, unspecified: Secondary | ICD-10-CM | POA: Diagnosis not present

## 2020-03-10 DIAGNOSIS — E1169 Type 2 diabetes mellitus with other specified complication: Secondary | ICD-10-CM | POA: Diagnosis present

## 2020-03-10 DIAGNOSIS — K565 Intestinal adhesions [bands], unspecified as to partial versus complete obstruction: Principal | ICD-10-CM | POA: Diagnosis present

## 2020-03-10 DIAGNOSIS — K56609 Unspecified intestinal obstruction, unspecified as to partial versus complete obstruction: Secondary | ICD-10-CM

## 2020-03-10 DIAGNOSIS — K567 Ileus, unspecified: Secondary | ICD-10-CM | POA: Diagnosis not present

## 2020-03-10 LAB — CBC
HCT: 47.9 % (ref 39.0–52.0)
Hemoglobin: 16.5 g/dL (ref 13.0–17.0)
MCH: 30.7 pg (ref 26.0–34.0)
MCHC: 34.4 g/dL (ref 30.0–36.0)
MCV: 89.2 fL (ref 80.0–100.0)
Platelets: 255 10*3/uL (ref 150–400)
RBC: 5.37 MIL/uL (ref 4.22–5.81)
RDW: 12.1 % (ref 11.5–15.5)
WBC: 15.1 10*3/uL — ABNORMAL HIGH (ref 4.0–10.5)
nRBC: 0 % (ref 0.0–0.2)

## 2020-03-10 LAB — COMPREHENSIVE METABOLIC PANEL
ALT: 60 U/L — ABNORMAL HIGH (ref 0–44)
AST: 41 U/L (ref 15–41)
Albumin: 4.3 g/dL (ref 3.5–5.0)
Alkaline Phosphatase: 56 U/L (ref 38–126)
Anion gap: 13 (ref 5–15)
BUN: 12 mg/dL (ref 6–20)
CO2: 26 mmol/L (ref 22–32)
Calcium: 9.7 mg/dL (ref 8.9–10.3)
Chloride: 95 mmol/L — ABNORMAL LOW (ref 98–111)
Creatinine, Ser: 0.92 mg/dL (ref 0.61–1.24)
GFR, Estimated: >60 mL/min (ref >60)
Glucose, Bld: 280 mg/dL — ABNORMAL HIGH (ref 70–99)
Potassium: 4.4 mmol/L (ref 3.5–5.1)
Sodium: 134 mmol/L — ABNORMAL LOW (ref 135–145)
Total Bilirubin: 1 mg/dL (ref 0.3–1.2)
Total Protein: 7.6 g/dL (ref 6.5–8.1)

## 2020-03-10 LAB — HEMOGLOBIN A1C
Hgb A1c MFr Bld: 7.4 % — ABNORMAL HIGH (ref 4.8–5.6)
Mean Plasma Glucose: 165.68 mg/dL

## 2020-03-10 LAB — HIV ANTIBODY (ROUTINE TESTING W REFLEX): HIV Screen 4th Generation wRfx: NONREACTIVE

## 2020-03-10 LAB — CBG MONITORING, ED
Glucose-Capillary: 217 mg/dL — ABNORMAL HIGH (ref 70–99)
Glucose-Capillary: 205 mg/dL — ABNORMAL HIGH (ref 70–99)
Glucose-Capillary: 147 mg/dL — ABNORMAL HIGH (ref 70–99)
Glucose-Capillary: 132 mg/dL — ABNORMAL HIGH (ref 70–99)

## 2020-03-10 LAB — RESP PANEL BY RT-PCR (FLU A&B, COVID) ARPGX2
Influenza A by PCR: NEGATIVE
Influenza B by PCR: NEGATIVE
SARS Coronavirus 2 by RT PCR: NEGATIVE

## 2020-03-10 LAB — GLUCOSE, CAPILLARY: Glucose-Capillary: 156 mg/dL — ABNORMAL HIGH (ref 70–99)

## 2020-03-10 LAB — TROPONIN I (HIGH SENSITIVITY): Troponin I (High Sensitivity): 5 ng/L (ref <18)

## 2020-03-10 LAB — LIPASE, BLOOD: Lipase: 22 U/L (ref 11–51)

## 2020-03-10 MED ORDER — INFLUENZA VAC SPLIT QUAD 0.5 ML IM SUSY
0.5000 mL | PREFILLED_SYRINGE | INTRAMUSCULAR | Status: AC
  Administered 2020-03-12: 0.5 mL via INTRAMUSCULAR
  Filled 2020-03-10: qty 0.5

## 2020-03-10 MED ORDER — MORPHINE SULFATE (PF) 2 MG/ML IV SOLN
2.0000 mg | INTRAVENOUS | Status: DC | PRN
  Administered 2020-03-10 – 2020-03-13 (×4): 2 mg via INTRAVENOUS
  Filled 2020-03-10 (×4): qty 1

## 2020-03-10 MED ORDER — ONDANSETRON 4 MG PO TBDP
4.0000 mg | ORAL_TABLET | Freq: Once | ORAL | Status: AC | PRN
  Administered 2020-03-10: 4 mg via ORAL
  Filled 2020-03-10: qty 1

## 2020-03-10 MED ORDER — LACTATED RINGERS IV SOLN: INTRAVENOUS | Status: DC

## 2020-03-10 MED ORDER — FENTANYL CITRATE (PF) 100 MCG/2ML IJ SOLN
50.0000 ug | Freq: Once | INTRAMUSCULAR | Status: AC
  Administered 2020-03-10: 50 ug via INTRAVENOUS
  Filled 2020-03-10: qty 2

## 2020-03-10 MED ORDER — ONDANSETRON HCL 4 MG/2ML IJ SOLN
4.0000 mg | Freq: Four times a day (QID) | INTRAMUSCULAR | Status: DC | PRN
  Administered 2020-03-10 – 2020-03-13 (×2): 4 mg via INTRAVENOUS
  Filled 2020-03-10 (×2): qty 2

## 2020-03-10 MED ORDER — INSULIN ASPART 100 UNIT/ML ~~LOC~~ SOLN
0.0000 [IU] | SUBCUTANEOUS | Status: DC
  Administered 2020-03-10: 1 [IU] via SUBCUTANEOUS
  Administered 2020-03-10: 3 [IU] via SUBCUTANEOUS
  Administered 2020-03-10: 1 [IU] via SUBCUTANEOUS
  Administered 2020-03-10: 3 [IU] via SUBCUTANEOUS
  Administered 2020-03-10 – 2020-03-11 (×3): 2 [IU] via SUBCUTANEOUS
  Administered 2020-03-11 – 2020-03-14 (×5): 1 [IU] via SUBCUTANEOUS
  Administered 2020-03-14: 2 [IU] via SUBCUTANEOUS
  Administered 2020-03-15: 3 [IU] via SUBCUTANEOUS
  Administered 2020-03-15: 2 [IU] via SUBCUTANEOUS
  Filled 2020-03-10 (×15): qty 1

## 2020-03-10 MED ORDER — IOHEXOL 300 MG/ML  SOLN
100.0000 mL | Freq: Once | INTRAMUSCULAR | Status: AC | PRN
  Administered 2020-03-10: 100 mL via INTRAVENOUS

## 2020-03-10 MED ORDER — ONDANSETRON HCL 4 MG PO TABS: 4.0000 mg | ORAL_TABLET | Freq: Four times a day (QID) | ORAL | Status: DC | PRN

## 2020-03-10 MED ORDER — LABETALOL HCL 5 MG/ML IV SOLN
5.0000 mg | Freq: Four times a day (QID) | INTRAVENOUS | Status: DC | PRN
  Administered 2020-03-13: 5 mg via INTRAVENOUS
  Filled 2020-03-10: qty 4

## 2020-03-10 MED ORDER — METOPROLOL TARTRATE 5 MG/5ML IV SOLN
5.0000 mg | Freq: Four times a day (QID) | INTRAVENOUS | Status: DC
  Administered 2020-03-10 – 2020-03-14 (×15): 5 mg via INTRAVENOUS
  Filled 2020-03-10 (×15): qty 5

## 2020-03-10 MED ORDER — LACTATED RINGERS IV BOLUS
1000.0000 mL | Freq: Once | INTRAVENOUS | Status: AC
  Administered 2020-03-10: 1000 mL via INTRAVENOUS

## 2020-03-10 MED ORDER — ENOXAPARIN SODIUM 40 MG/0.4ML ~~LOC~~ SOLN
40.0000 mg | SUBCUTANEOUS | Status: DC
  Administered 2020-03-10 – 2020-03-16 (×7): 40 mg via SUBCUTANEOUS
  Filled 2020-03-10 (×7): qty 0.4

## 2020-03-10 MED ORDER — ENALAPRILAT 1.25 MG/ML IV SOLN: 0.6250 mg | Freq: Four times a day (QID) | INTRAVENOUS | Status: DC

## 2020-03-10 NOTE — ED Notes (Addendum)
39F NG tube placed by this RN and Ted Mcalpine. Pt tolerated well. NG tube secured at the nose at 57 with securing device.

## 2020-03-10 NOTE — Consult Note (Signed)
Date of Consultation:  03/10/2020  Requesting Physician:  Lindajo Royal, MD  Reason for Consultation:  Small bowel obstruction  History of Present Illness: Scott [name] is a 59 y.o. male presenting with a 1 day history of abdominal pain associated with nausea but no emesis.  Patient does have a prior history of abdominal surgeries including appendectomy, cholecystectomy, prior small bowel resection for Meckel's diverticulum, prior exploratory laparotomy with lysis of adhesions and small bowel resection for small bowel obstruction.  He presents with another episode of small bowel obstruction diagnosed on CT scan today.  Patient denies any fevers, chills, chest pain, shortness of breath.  Reports that he has not had a bowel movement today and has not had any flatus either.  He feels that he over ate too much the day before which was Thanksgiving day.  Presenting to the emergency room, the patient had laboratory work-up showing some hemoconcentration with a white blood cell count of 15.1 hemoglobin of 16.5.  Creatinine is normal at 0.92.  His CT scan of the abdomen pelvis showed small bowel obstruction with a transition point at the patient's previous small bowel anastomosis in the distal ileum.  I have independently viewed the patient's imaging study and agree with the findings.  There is no evidence of pneumatosis, ischemia, or internal hernia.  He had an NG tube placed reports that he is feeling better with less pain today.  Past Medical History: Past Medical History:  Diagnosis Date  . Allergy   . Diabetes mellitus without complication (HCC)    pt. stated "I don't have diabetes", says last time he was in hospital the dx changed  . History of kidney stones    H/O STONES  . Hypertension      Past Surgical History: Past Surgical History:  Procedure Laterality Date  . APPENDECTOMY    . CHOLECYSTECTOMY    . COLONOSCOPY WITH PROPOFOL N/A 06/24/2017   Procedure: COLONOSCOPY WITH PROPOFOL;   Surgeon: Earline Mayotte, MD;  Location: [facility] ENDOSCOPY;  Service: Endoscopy;  Laterality: N/A;  . LAPAROTOMY N/A 02/05/2016   Procedure: EXPLORATORY LAPAROTOMY FOR SMALL BOWEL OBSTRUCTION;  Surgeon: Kieth Brightly, MD;  Location: [facility] ORS;  Service: General;  Laterality: N/A;  . LYSIS OF ADHESION  02/05/2016   Procedure: LYSIS OF ADHESION;  Surgeon: Kieth Brightly, MD;  Location: [facility] ORS;  Service: General;;  . SMALL INTESTINE SURGERY  2005   Excision of Meckel's diverticulum  . VEIN REPAIR Left 07/28/2018   Procedure: RADIAL ARTERY LIGATION LEFT;  Surgeon: Annice Needy, MD;  Location: [facility] ORS;  Service: Vascular;  Laterality: Left;    Home Medications: Prior to Admission medications   Medication Sig Start Date End Date Taking? Authorizing Provider  ASPIRIN LOW DOSE 81 MG EC tablet TAKE 1 TABLET BY MOUTH DAILY 09/01/19  Yes Georgiana Spinner, NP  Aspirin-Salicylamide-Caffeine (BC HEADACHE POWDER PO) Take 1 packet by mouth daily as needed.   Yes [provider]  lisinopril (ZESTRIL) 10 MG tablet Take 1 tablet (10 mg total) by mouth daily. 01/27/19  Yes Galen Manila, NP  metFORMIN (GLUCOPHAGE) 500 MG tablet Take 1 tablet (500 mg total) by mouth 2 (two) times daily with a meal. 01/27/19  Yes Galen Manila, NP  rosuvastatin (CRESTOR) 5 MG tablet Take 5 mg by mouth daily. 03/02/20  Yes [provider]  sildenafil (REVATIO) 20 MG tablet TAKE 1-3 TABLETS (20-60 MG TOTAL) BY MOUTH AS NEEDED (TO OBTAIN AN ERECTION).  05/06/19  Yes Karamalegos, Netta Neat, DO    Allergies: No Known Allergies  Social History:  reports that he has never smoked. He has never used smokeless tobacco. He reports previous alcohol use. He reports that he does not use drugs.   Family History: Family History  Problem Relation Age of Onset  . Heart disease Mother   . Hypertension Mother   . Diabetes Mother   . Diabetes Sister   . Diabetes Brother     Review of  Systems: Review of Systems  Constitutional: Negative for chills and fever.  HENT: Negative for hearing loss.   Respiratory: Negative for shortness of breath.   Cardiovascular: Negative for chest pain.  Gastrointestinal: Positive for abdominal pain and nausea. Negative for constipation, diarrhea and vomiting.  Genitourinary: Negative for dysuria.  Musculoskeletal: Negative for myalgias.  Skin: Negative for rash.  Neurological: Negative for dizziness.  Psychiatric/Behavioral: Negative for depression.    Physical Exam BP (!) 135/101   Pulse (!) 110   Temp 97.9 F (36.6 C) (Oral)   Resp 20   Ht 5\' 7"  (1.702 m)   Wt 72.6 kg   SpO2 94%   BMI 25.06 kg/m  CONSTITUTIONAL: No acute distress HEENT:  Normocephalic, atraumatic, extraocular motion intact. NECK: Trachea is midline, and there is no jugular venous distension. RESPIRATORY:  Normal respiratory effort without pathologic use of accessory muscles. CARDIOVASCULAR: Normal sinus rhythm with low-grade tachycardia. GI: The abdomen is soft, distended, with no tenderness to palpation at this point.  Patient's prior midline incision is well-healed.  MUSCULOSKELETAL:  Normal muscle strength and tone in all four extremities.  No peripheral edema or cyanosis. SKIN: Skin turgor is normal. There are no pathologic skin lesions.  NEUROLOGIC:  Motor and sensation is grossly normal.  Cranial nerves are grossly intact. PSYCH:  Alert and oriented to person, place and time. Affect is normal.  Laboratory Analysis: Results for orders placed or performed during the hospital encounter of 03/10/20 (from the past 24 hour(s))  Lipase, blood     Status: None   Collection Time: 03/10/20 12:34 AM  Result Value Ref Range   Lipase 22 11 - 51 U/L  Comprehensive metabolic panel     Status: Abnormal   Collection Time: 03/10/20 12:34 AM  Result Value Ref Range   Sodium 134 (L) 135 - 145 mmol/L   Potassium 4.4 3.5 - 5.1 mmol/L   Chloride 95 (L) 98 - 111 mmol/L    CO2 26 22 - 32 mmol/L   Glucose, Bld 280 (H) 70 - 99 mg/dL   BUN 12 6 - 20 mg/dL   Creatinine, Ser 03/12/20 0.61 - 1.24 mg/dL   Calcium 9.7 8.9 - 0.93 mg/dL   Total Protein 7.6 6.5 - 8.1 g/dL   Albumin 4.3 3.5 - 5.0 g/dL   AST 41 15 - 41 U/L   ALT 60 (H) 0 - 44 U/L   Alkaline Phosphatase 56 38 - 126 U/L   Total Bilirubin 1.0 0.3 - 1.2 mg/dL   GFR, Estimated 81.8 >29 mL/min   Anion gap 13 5 - 15  CBC     Status: Abnormal   Collection Time: 03/10/20 12:34 AM  Result Value Ref Range   WBC 15.1 (H) 4.0 - 10.5 K/uL   RBC 5.37 4.22 - 5.81 MIL/uL   Hemoglobin 16.5 13.0 - 17.0 g/dL   HCT 03/12/20 39 - 52 %   MCV 89.2 80.0 - 100.0 fL   MCH 30.7 26.0 - 34.0 pg  MCHC 34.4 30.0 - 36.0 g/dL   RDW 16.1 09.6 - 04.5 %   Platelets 255 150 - 400 K/uL   nRBC 0.0 0.0 - 0.2 %  Troponin I (High Sensitivity)     Status: None   Collection Time: 03/10/20 12:34 AM  Result Value Ref Range   Troponin I (High Sensitivity) 5 <18 ng/L  Resp Panel by RT-PCR (Flu A&B, Covid) Nasopharyngeal Swab     Status: None   Collection Time: 03/10/20  3:29 AM   Specimen: Nasopharyngeal Swab; Nasopharyngeal(NP) swabs in vial transport medium  Result Value Ref Range   SARS Coronavirus 2 by RT PCR NEGATIVE NEGATIVE   Influenza A by PCR NEGATIVE NEGATIVE   Influenza B by PCR NEGATIVE NEGATIVE  Hemoglobin A1c     Status: Abnormal   Collection Time: 03/10/20  5:16 AM  Result Value Ref Range   Hgb A1c MFr Bld 7.4 (H) 4.8 - 5.6 %   Mean Plasma Glucose 165.68 mg/dL  CBG monitoring, ED     Status: Abnormal   Collection Time: 03/10/20  5:22 AM  Result Value Ref Range   Glucose-Capillary 217 (H) 70 - 99 mg/dL  CBG monitoring, ED     Status: Abnormal   Collection Time: 03/10/20  7:54 AM  Result Value Ref Range   Glucose-Capillary 205 (H) 70 - 99 mg/dL    Imaging: DG Abdomen 1 View  Result Date: 03/10/2020 CLINICAL DATA:  NG tube placement EXAM: ABDOMEN - 1 VIEW COMPARISON:  CT from same day FINDINGS: The enteric tube tip  projects over the gastric body. There is no definite free air. The patient's previously demonstrated small-bowel obstruction is better visualized on the prior CT. IMPRESSION: Enteric tube projects over the gastric body. Electronically Signed   By: Katherine Mantle M.D.   On: 03/10/2020 03:38   CT ABDOMEN PELVIS W CONTRAST  Result Date: 03/10/2020 CLINICAL DATA:  Nausea and vomiting since last night. EXAM: CT ABDOMEN AND PELVIS WITH CONTRAST TECHNIQUE: Multidetector CT imaging of the abdomen and pelvis was performed using the standard protocol following bolus administration of intravenous contrast. CONTRAST:  OMNIPAQUE IOHEXOL 300 MG/ML  SOLN COMPARISON:  CT chest 02/06/2016.  CT abdomen and pelvis 09/27/2015 FINDINGS: Lower chest: The lung bases are clear. Hepatobiliary: No focal liver abnormality is seen. Status post cholecystectomy. No biliary dilatation. Diffuse fatty infiltration of the liver. Pancreas: Unremarkable. No pancreatic ductal dilatation or surrounding inflammatory changes. Spleen: Normal in size without focal abnormality. Adrenals/Urinary Tract: Cyst in the upper pole left kidney. Adrenal glands, kidneys, ureters, and bladder are otherwise normal. Stomach/Bowel: Moderate fluid distended stomach without wall thickening. Dilated fluid-filled mid/distal small bowel with decompressed terminal ileum. Transition zone appears to be at a surgical anastomosis in the distal ileum. The colon is not abnormally distended with scattered stool present. The appendix is normal. Vascular/Lymphatic: Aortic atherosclerosis. No enlarged abdominal or pelvic lymph nodes. Reproductive: Prostate is unremarkable. Other: Small amount of free fluid in the pelvis. No free air. Abdominal wall musculature appears intact. Musculoskeletal: No acute or significant osseous findings. IMPRESSION: 1. Small bowel obstruction with transition zone at a surgical anastomosis in the distal ileum. 2. Small amount of free fluid in  the pelvis. 3. Diffuse fatty infiltration of the liver. 4. Aortic atherosclerosis. Aortic Atherosclerosis (ICD10-I70.0). Electronically Signed   By: Burman Nieves M.D.   On: 03/10/2020 02:34    Assessment and Plan: This is a 59 y.o. male with small bowel obstruction.  -Discussed with the patient  conservative management for small bowel obstruction.  He will be n.p.o., with NG tube to suction, with an IV fluid hydration.  Will order KUB for the morning time and depending on his progression, discussed with the patient that we may order Gastrografin challenge.  Unfortunately given his multiple surgical history, the patient is at risk of requiring surgery still if this SBO episode does not resolve on its own with conservative measures.  Patient understands this. -We will continue to follow along with you.  Face-to-face time spent with the patient and care providers was 55 minutes, with more than 50% of the time spent counseling, educating, and coordinating care of the patient.     Howie IllJose Luis Maciej Schweitzer, MD  [facility]:  [phone]

## 2020-03-10 NOTE — ED Provider Notes (Signed)
[facility] Emergency Department Provider Note  ____________________________________________  Time seen: Approximately 3:53 AM  I have reviewed the triage vital signs and the nursing notes.   HISTORY  Chief Complaint Nausea   HPI Scott [name] is a 59 y.o. male with a history of diabetes, hypertension, partial colectomy, appendectomy, cholecystectomy, SBO who presents for evaluation of abdominal pain, nausea and vomiting.   Patient reports that his symptoms started at 7 PM.  His complaint of abdominal distention, severe nausea, a few episodes of vomiting the emergency room, and diffuse cramping abdominal pain.  He reports that after vomiting the pain is gotten significantly better.  Last bowel movement was today.  No fever, chills, dysuria, chest pain, shortness of breath.  Past Medical History:  Diagnosis Date  . Allergy   . Diabetes mellitus without complication (HCC)    pt. stated "I don't have diabetes", says last time he was in hospital the dx changed  . History of kidney stones    H/O STONES  . Hypertension     Patient Active Problem List   Diagnosis Date Noted  . History of partial colectomy 03/10/2020  . History of small bowel obstruction 05/27/2019  . DM type 2 with diabetic mixed hyperlipidemia (HCC) 05/27/2019  . Benign essential hypertension 05/27/2019  . Diabetes mellitus without complication (HCC) 02/17/2019  . Pain due to onychomycosis of toenails of both feet 02/17/2019  . Injury of radial artery, left, subsequent encounter 07/28/2018  . Hypertension 10/29/2015  . Controlled diabetes mellitus type II without complication (HCC) 10/29/2015  . Onychomycosis 10/29/2015  . SBO (small bowel obstruction) (HCC) 09/27/2015    Past Surgical History:  Procedure Laterality Date  . APPENDECTOMY    . CHOLECYSTECTOMY    . COLONOSCOPY WITH PROPOFOL N/A 06/24/2017   Procedure: COLONOSCOPY WITH PROPOFOL;  Surgeon: Earline Mayotte, MD;   Location: [facility] ENDOSCOPY;  Service: Endoscopy;  Laterality: N/A;  . LAPAROTOMY N/A 02/05/2016   Procedure: EXPLORATORY LAPAROTOMY FOR SMALL BOWEL OBSTRUCTION;  Surgeon: Kieth Brightly, MD;  Location: [facility] ORS;  Service: General;  Laterality: N/A;  . LYSIS OF ADHESION  02/05/2016   Procedure: LYSIS OF ADHESION;  Surgeon: Kieth Brightly, MD;  Location: [facility] ORS;  Service: General;;  . SMALL INTESTINE SURGERY  2005   Excision of Meckel's diverticulum  . VEIN REPAIR Left 07/28/2018   Procedure: RADIAL ARTERY LIGATION LEFT;  Surgeon: Annice Needy, MD;  Location: [facility] ORS;  Service: Vascular;  Laterality: Left;    Prior to Admission medications   Medication Sig Start Date End Date Taking? Authorizing Provider  ASPIRIN LOW DOSE 81 MG EC tablet TAKE 1 TABLET BY MOUTH DAILY 09/01/19  Yes Georgiana Spinner, NP  Aspirin-Salicylamide-Caffeine (BC HEADACHE POWDER PO) Take 1 packet by mouth daily as needed.   Yes [provider]  lisinopril (ZESTRIL) 10 MG tablet Take 1 tablet (10 mg total) by mouth daily. 01/27/19  Yes Galen Manila, NP  metFORMIN (GLUCOPHAGE) 500 MG tablet Take 1 tablet (500 mg total) by mouth 2 (two) times daily with a meal. 01/27/19  Yes Galen Manila, NP  rosuvastatin (CRESTOR) 5 MG tablet Take 5 mg by mouth daily. 03/02/20  Yes [provider]  sildenafil (REVATIO) 20 MG tablet TAKE 1-3 TABLETS (20-60 MG TOTAL) BY MOUTH AS NEEDED (TO OBTAIN AN ERECTION). 05/06/19  Yes Karamalegos, Netta Neat, DO    Allergies Patient has no known allergies.  Family History  Problem Relation Age  of Onset  . Heart disease Mother   . Hypertension Mother   . Diabetes Mother   . Diabetes Sister   . Diabetes Brother     Social History Social History   Tobacco Use  . Smoking status: Never Smoker  . Smokeless tobacco: Never Used  Vaping Use  . Vaping Use: Never used  Substance Use Topics  . Alcohol use: Not Currently    Alcohol/week: 0.0 standard  drinks    Comment: occasional  . Drug use: No    Review of Systems  Constitutional: Negative for fever. Eyes: Negative for visual changes. ENT: Negative for sore throat. Neck: No neck pain  Cardiovascular: Negative for chest pain. Respiratory: Negative for shortness of breath. Gastrointestinal: + abdominal pain, nausea, and vomiting. no diarrhea. Genitourinary: Negative for dysuria. Musculoskeletal: Negative for back pain. Skin: Negative for rash. Neurological: Negative for headaches, weakness or numbness. Psych: No SI or HI  ____________________________________________   PHYSICAL EXAM:  VITAL SIGNS: ED Triage Vitals  Enc Vitals Group     BP 03/10/20 0022 112/78     Pulse Rate 03/10/20 0022 (!) 107     Resp 03/10/20 0022 20     Temp 03/10/20 0022 97.9 F (36.6 C)     Temp Source 03/10/20 0022 Oral     SpO2 03/10/20 0018 97 %     Weight 03/10/20 0023 160 lb (72.6 kg)     Height 03/10/20 0023 5\' 7"  (1.702 m)     Head Circumference --      Peak Flow --      Pain Score 03/10/20 0022 8     Pain Loc --      Pain Edu? --      Excl. in GC? --     Constitutional: Alert and oriented. Well appearing and in no apparent distress. HEENT:      Head: Normocephalic and atraumatic.         Eyes: Conjunctivae are normal. Sclera is non-icteric.       Mouth/Throat: Mucous membranes are moist.       Neck: Supple with no signs of meningismus. Cardiovascular: Regular rate and rhythm. No murmurs, gallops, or rubs.  Respiratory: Normal respiratory effort. Lungs are clear to auscultation bilaterally.  Gastrointestinal: Distended with mild diffuse tenderness to palpation, decreased bowel sounds. Musculoskeletal:  No edema, cyanosis, or erythema of extremities. Neurologic: Normal speech and language. Face is symmetric. Moving all extremities. No gross focal neurologic deficits are appreciated. Skin: Skin is warm, dry and intact. No rash noted. Psychiatric: Mood and affect are normal.  Speech and behavior are normal.  ____________________________________________   LABS (all labs ordered are listed, but only abnormal results are displayed)  Labs Reviewed  COMPREHENSIVE METABOLIC PANEL - Abnormal; Notable for the following components:      Result Value   Sodium 134 (*)    Chloride 95 (*)    Glucose, Bld 280 (*)    ALT 60 (*)    All other components within normal limits  CBC - Abnormal; Notable for the following components:   WBC 15.1 (*)    All other components within normal limits  RESP PANEL BY RT-PCR (FLU A&B, COVID) ARPGX2  LIPASE, BLOOD  URINALYSIS, COMPLETE (UACMP) WITH MICROSCOPIC  HEMOGLOBIN A1C  HIV ANTIBODY (ROUTINE TESTING W REFLEX)  TROPONIN I (HIGH SENSITIVITY)   ____________________________________________  EKG  ED ECG REPORT I, Nita Sicklearolina Jamilett Ferrante, the attending physician, personally viewed and interpreted this ECG.  Sinus tachycardia, rate of 105,  normal intervals, normal axis, no ST elevations or depressions.  Otherwise normal EKG. ____________________________________________  RADIOLOGY  I have personally reviewed the images performed during this visit and I agree with the Radiologist's read.   Interpretation by Radiologist:  DG Abdomen 1 View  Result Date: 03/10/2020 CLINICAL DATA:  NG tube placement EXAM: ABDOMEN - 1 VIEW COMPARISON:  CT from same day FINDINGS: The enteric tube tip projects over the gastric body. There is no definite free air. The patient's previously demonstrated small-bowel obstruction is better visualized on the prior CT. IMPRESSION: Enteric tube projects over the gastric body. Electronically Signed   By: Katherine Mantle M.D.   On: 03/10/2020 03:38   CT ABDOMEN PELVIS W CONTRAST  Result Date: 03/10/2020 CLINICAL DATA:  Nausea and vomiting since last night. EXAM: CT ABDOMEN AND PELVIS WITH CONTRAST TECHNIQUE: Multidetector CT imaging of the abdomen and pelvis was performed using the standard protocol following  bolus administration of intravenous contrast. CONTRAST:  OMNIPAQUE IOHEXOL 300 MG/ML  SOLN COMPARISON:  CT chest 02/06/2016.  CT abdomen and pelvis 09/27/2015 FINDINGS: Lower chest: The lung bases are clear. Hepatobiliary: No focal liver abnormality is seen. Status post cholecystectomy. No biliary dilatation. Diffuse fatty infiltration of the liver. Pancreas: Unremarkable. No pancreatic ductal dilatation or surrounding inflammatory changes. Spleen: Normal in size without focal abnormality. Adrenals/Urinary Tract: Cyst in the upper pole left kidney. Adrenal glands, kidneys, ureters, and bladder are otherwise normal. Stomach/Bowel: Moderate fluid distended stomach without wall thickening. Dilated fluid-filled mid/distal small bowel with decompressed terminal ileum. Transition zone appears to be at a surgical anastomosis in the distal ileum. The colon is not abnormally distended with scattered stool present. The appendix is normal. Vascular/Lymphatic: Aortic atherosclerosis. No enlarged abdominal or pelvic lymph nodes. Reproductive: Prostate is unremarkable. Other: Small amount of free fluid in the pelvis. No free air. Abdominal wall musculature appears intact. Musculoskeletal: No acute or significant osseous findings. IMPRESSION: 1. Small bowel obstruction with transition zone at a surgical anastomosis in the distal ileum. 2. Small amount of free fluid in the pelvis. 3. Diffuse fatty infiltration of the liver. 4. Aortic atherosclerosis. Aortic Atherosclerosis (ICD10-I70.0). Electronically Signed   By: Burman Nieves M.D.   On: 03/10/2020 02:34     ____________________________________________   PROCEDURES  Procedure(s) performed:yes .1-3 Lead EKG Interpretation Performed by: Nita Sickle, MD Authorized by: Nita Sickle, MD     Interpretation: non-specific     ECG rate assessment: tachycardic     Rhythm: sinus tachycardia     Ectopy: none     Critical Care performed:   None ____________________________________________   INITIAL IMPRESSION / ASSESSMENT AND PLAN / ED COURSE   59 y.o. male with a history of diabetes, hypertension, partial colectomy, appendectomy, cholecystectomy, SBO who presents for evaluation of abdominal pain, nausea and vomiting.   Patient arrives actively vomiting in triage with a pretty distended abdomen and decreased bowel sounds concerning for an SBO.  He was sent for CT which was visualized by me showing an SBO, confirmed by radiology.  Old medical records reviewed showing that patient has had prior SBO's in the past due to adhesions from prior several prior surgeries.  Labs showing mild hyperglycemia no evidence of DKA, leukocytosis with white count of 15.  NG tube was placed and connected to intermittent suction.  Abdominal x-ray visualized by me showing appropriate location of NG tube, confirmed by radiology.  Patient given IV fentanyl for pain.  Discussed with Dr. Para March for admission.  Patient placed on  telemetry for close monitoring.      _____________________________________________ Please note:  Patient was evaluated in Emergency Department today for the symptoms described in the history of present illness. Patient was evaluated in the context of the global COVID-19 pandemic, which necessitated consideration that the patient might be at risk for infection with the SARS-CoV-2 virus that causes COVID-19. Institutional protocols and algorithms that pertain to the evaluation of patients at risk for COVID-19 are in a state of rapid change based on information released by regulatory bodies including the CDC and federal and state organizations. These policies and algorithms were followed during the patient's care in the ED.  Some ED evaluations and interventions may be delayed as a result of limited staffing during the pandemic.   [place] Controlled Substance Database was reviewed by me. ____________________________________________   FINAL  CLINICAL IMPRESSION(S) / ED DIAGNOSES   Final diagnoses:  SBO (small bowel obstruction) (HCC)      NEW MEDICATIONS STARTED DURING THIS VISIT:  ED Discharge Orders    None       Note:  This document was prepared using Dragon voice recognition software and may include unintentional dictation errors.    Nita Sickle, MD 03/10/20 [phone]

## 2020-03-10 NOTE — Progress Notes (Signed)
PROGRESS NOTE    MACARIO SHEAR III  NIO:[id number] DOB: 16-Jul-1960 DOA: 03/10/2020 PCP: Danella Penton, MD    Brief Narrative:  59 y.o. male with medical history significant for DM, HTN, partial small bowel resection for Meckel's diverticulum with recurrent episodes of small bowel obstruction who presents to the emergency room with several hour history of abdominal pain and distention and vomiting. Imaging confirmed evidence of SBO with transition zone at surgical anastomosis and distal ileum  Assessment & Plan:   Principal Problem:   SBO (small bowel obstruction) (HCC) Active Problems:   Controlled diabetes mellitus type II without complication (HCC)   Benign essential hypertension   History of resection of small bowel    SBO (small bowel obstruction) (HCC)   History of resection of small bowel for Meckel's diverticulum   History of recurrent SBO at anastomotic site -Patient with abdominal pain and distention and vomiting typical for prior SBO's -CT abdomen and pelvis showingSmall bowel obstruction with transition zone at a surgical anastomosis in the distal ileum -Pt has been continued with NG -Surgery consulted. Recommendation to continue NG. Pt is considered at risk for ultimately requiring surgery -Will repeat CBC and bmet in AM    Controlled diabetes mellitus type II without complication (HCC) -Sliding scale insulin coverage -Glucose currently stable    Benign essential hypertension -IV labetalol as needed while n.p.o. -Will continue pt on scheduled IV lopressor with hold parameters    DVT prophylaxis: Lovenox subQ Code Status: Full Family Communication: Pt in room, family not at bedside  Status is: Inpatient  Remains inpatient appropriate because:Ongoing diagnostic testing needed not appropriate for outpatient work up, Unsafe d/c plan, IV treatments appropriate due to intensity of illness or inability to take PO and Inpatient level of care appropriate due to  severity of illness   Dispo: The patient is from: Home              Anticipated d/c is to: Home              Anticipated d/c date is: > 3 days              Patient currently is not medically stable to d/c.       Consultants:   General Surgery  Procedures:     Antimicrobials: Anti-infectives (From admission, onward)   None       Subjective: Reports feeling better following NG placement  Objective: Vitals:   03/10/20 0830 03/10/20 0900 03/10/20 1000 03/10/20 1200  BP: (!) 135/101 (!) 142/96 (!) 139/94 (!) 122/92  Pulse: (!) 110 (!) 112 (!) 116 (!) 114  Resp: 20 18 18 16   Temp:      TempSrc:      SpO2: 94% 94% 97% 94%  Weight:      Height:        Intake/Output Summary (Last 24 hours) at 03/10/2020 1503 Last data filed at 03/10/2020 1400 Gross per 24 hour  Intake 1000 ml  Output 1000 ml  Net 0 ml   Filed Weights   03/10/20 0023  Weight: 72.6 kg    Examination:  General exam: Appears calm and comfortable  Respiratory system: Clear to auscultation. Respiratory effort normal. Cardiovascular system: S1 & S2 heard, Regular Gastrointestinal system: decreased BS, NG in place Central nervous system: Alert and oriented. No focal neurological deficits. Extremities: Symmetric 5 x 5 power. Skin: No rashes, lesions Psychiatry: Judgement and insight appear normal. Mood & affect appropriate.   Data Reviewed: I  have personally reviewed following labs and imaging studies  CBC: Recent Labs  Lab 03/10/20 0034  WBC 15.1*  HGB 16.5  HCT 47.9  MCV 89.2  PLT 255   Basic Metabolic Panel: Recent Labs  Lab 03/10/20 0034  NA 134*  K 4.4  CL 95*  CO2 26  GLUCOSE 280*  BUN 12  CREATININE 0.92  CALCIUM 9.7   GFR: Estimated Creatinine Clearance: 82.8 mL/min (by C-G formula based on SCr of 0.92 mg/dL). Liver Function Tests: Recent Labs  Lab 03/10/20 0034  AST 41  ALT 60*  ALKPHOS 56  BILITOT 1.0  PROT 7.6  ALBUMIN 4.3   Recent Labs  Lab  03/10/20 0034  LIPASE 22   No results for input(s): AMMONIA in the last 168 hours. Coagulation Profile: No results for input(s): INR, PROTIME in the last 168 hours. Cardiac Enzymes: No results for input(s): CKTOTAL, CKMB, CKMBINDEX, TROPONINI in the last 168 hours. BNP (last 3 results) No results for input(s): PROBNP in the last 8760 hours. HbA1C: Recent Labs    03/10/20 0516  HGBA1C 7.4*   CBG: Recent Labs  Lab 03/10/20 0522 03/10/20 0754 03/10/20 1136  GLUCAP 217* 205* 147*   Lipid Profile: No results for input(s): CHOL, HDL, LDLCALC, TRIG, CHOLHDL, LDLDIRECT in the last 72 hours. Thyroid Function Tests: No results for input(s): TSH, T4TOTAL, FREET4, T3FREE, THYROIDAB in the last 72 hours. Anemia Panel: No results for input(s): VITAMINB12, FOLATE, FERRITIN, TIBC, IRON, RETICCTPCT in the last 72 hours. Sepsis Labs: No results for input(s): PROCALCITON, LATICACIDVEN in the last 168 hours.  Recent Results (from the past 240 hour(s))  Resp Panel by RT-PCR (Flu A&B, Covid) Nasopharyngeal Swab     Status: None   Collection Time: 03/10/20  3:29 AM   Specimen: Nasopharyngeal Swab; Nasopharyngeal(NP) swabs in vial transport medium  Result Value Ref Range Status   SARS Coronavirus 2 by RT PCR NEGATIVE NEGATIVE Final    Comment: (NOTE) SARS-CoV-2 target nucleic acids are NOT DETECTED.  The SARS-CoV-2 RNA is generally detectable in upper respiratory specimens during the acute phase of infection. The lowest concentration of SARS-CoV-2 viral copies this assay can detect is 138 copies/mL. A negative result does not preclude SARS-Cov-2 infection and should not be used as the sole basis for treatment or other patient management decisions. A negative result may occur with  improper specimen collection/handling, submission of specimen other than nasopharyngeal swab, presence of viral mutation(s) within the areas targeted by this assay, and inadequate number of viral copies(<138  copies/mL). A negative result must be combined with clinical observations, patient history, and epidemiological information. The expected result is Negative.  Fact Sheet for Patients:  BloggerCourse.com  Fact Sheet for Healthcare Providers:  SeriousBroker.it  This test is no t yet approved or cleared by the [place] FDA and  has been authorized for detection and/or diagnosis of SARS-CoV-2 by FDA under an Emergency Use Authorization (EUA). This EUA will remain  in effect (meaning this test can be used) for the duration of the COVID-19 declaration under Section 564(b)(1) of the Act, 21 U.S.C.section 360bbb-3(b)(1), unless the authorization is terminated  or revoked sooner.       Influenza A by PCR NEGATIVE NEGATIVE Final   Influenza B by PCR NEGATIVE NEGATIVE Final    Comment: (NOTE) The Xpert Xpress SARS-CoV-2/FLU/RSV plus assay is intended as an aid in the diagnosis of influenza from Nasopharyngeal swab specimens and should not be used as a sole basis for treatment. Nasal washings  and aspirates are unacceptable for Xpert Xpress SARS-CoV-2/FLU/RSV testing.  Fact Sheet for Patients: BloggerCourse.com  Fact Sheet for Healthcare Providers: SeriousBroker.it  This test is not yet approved or cleared by the [place] FDA and has been authorized for detection and/or diagnosis of SARS-CoV-2 by FDA under an Emergency Use Authorization (EUA). This EUA will remain in effect (meaning this test can be used) for the duration of the COVID-19 declaration under Section 564(b)(1) of the Act, 21 U.S.C. section 360bbb-3(b)(1), unless the authorization is terminated or revoked.  Performed at [facility], [address] [place], [place] [address]      Radiology Studies: DG Abdomen 1 View  Result Date: 03/10/2020 CLINICAL DATA:  NG tube placement EXAM: ABDOMEN - 1 VIEW  COMPARISON:  CT from same day FINDINGS: The enteric tube tip projects over the gastric body. There is no definite free air. The patient's previously demonstrated small-bowel obstruction is better visualized on the prior CT. IMPRESSION: Enteric tube projects over the gastric body. Electronically Signed   By: Katherine Mantle M.D.   On: 03/10/2020 03:38   CT ABDOMEN PELVIS W CONTRAST  Result Date: 03/10/2020 CLINICAL DATA:  Nausea and vomiting since last night. EXAM: CT ABDOMEN AND PELVIS WITH CONTRAST TECHNIQUE: Multidetector CT imaging of the abdomen and pelvis was performed using the standard protocol following bolus administration of intravenous contrast. CONTRAST:  OMNIPAQUE IOHEXOL 300 MG/ML  SOLN COMPARISON:  CT chest 02/06/2016.  CT abdomen and pelvis 09/27/2015 FINDINGS: Lower chest: The lung bases are clear. Hepatobiliary: No focal liver abnormality is seen. Status post cholecystectomy. No biliary dilatation. Diffuse fatty infiltration of the liver. Pancreas: Unremarkable. No pancreatic ductal dilatation or surrounding inflammatory changes. Spleen: Normal in size without focal abnormality. Adrenals/Urinary Tract: Cyst in the upper pole left kidney. Adrenal glands, kidneys, ureters, and bladder are otherwise normal. Stomach/Bowel: Moderate fluid distended stomach without wall thickening. Dilated fluid-filled mid/distal small bowel with decompressed terminal ileum. Transition zone appears to be at a surgical anastomosis in the distal ileum. The colon is not abnormally distended with scattered stool present. The appendix is normal. Vascular/Lymphatic: Aortic atherosclerosis. No enlarged abdominal or pelvic lymph nodes. Reproductive: Prostate is unremarkable. Other: Small amount of free fluid in the pelvis. No free air. Abdominal wall musculature appears intact. Musculoskeletal: No acute or significant osseous findings. IMPRESSION: 1. Small bowel obstruction with transition zone at a surgical  anastomosis in the distal ileum. 2. Small amount of free fluid in the pelvis. 3. Diffuse fatty infiltration of the liver. 4. Aortic atherosclerosis. Aortic Atherosclerosis (ICD10-I70.0). Electronically Signed   By: Burman Nieves M.D.   On: 03/10/2020 02:34    Scheduled Meds: . enoxaparin (LOVENOX) injection  40 mg Subcutaneous Q24H  . insulin aspart  0-9 Units Subcutaneous Q4H   Continuous Infusions: . lactated ringers 125 mL/hr at 03/10/20 1343     LOS: 0 days   Rickey Barbara, MD [facility] Pager On Amion  If 7PM-7AM, please contact night-coverage 03/10/2020, 3:03 PM

## 2020-03-10 NOTE — H&P (Signed)
History and Physical    Scott [name] OJJ:[id number] DOB: [birth year] DOA: 03/10/2020  PCP: Danella Penton, MD   Patient coming from: Home  I have personally briefly reviewed patient's old medical records in [facility]  Chief Complaint: Abdominal pain, nausea vomiting  HPI: Scott [name] is a 59 y.o. male with medical history significant for DM, HTN, partial small bowel resection for Meckel's diverticulum with recurrent episodes of small bowel obstruction who presents to the emergency room with several hour history of abdominal pain and distention and vomiting.  Abdominal pain is severe, crampy, and midline lower abdomen, nonradiating with no aggravating or alleviating factors.  Vomiting is nonbloody nonbilious.  He has no change in bowel habits and denies fever or chills, dysuria.  Denies chest pain or shortness of breath ED Course: On arrival he was afebrile, BP 112/78, pulse 107 with O2 sat 97% on room air.  WBC 15,000 with labs otherwise unremarkable.  Lipase 22.  CT abdomen and pelvis showed SBO with transition zone at the surgical anastomosis and distal ileum. EKG as reviewed by me : Sinus tachycardia at 105 with no acute ST-T wave changes Patient had an NG tube placed.  Hospitalist consulted for admission.  Review of Systems: As per HPI otherwise all other systems on review of systems negative.    Past Medical History:  Diagnosis Date  . Allergy   . Diabetes mellitus without complication (HCC)    pt. stated "I don't have diabetes", says last time he was in hospital the dx changed  . History of kidney stones    H/O STONES  . Hypertension     Past Surgical History:  Procedure Laterality Date  . APPENDECTOMY    . CHOLECYSTECTOMY    . COLONOSCOPY WITH PROPOFOL N/A 06/24/2017   Procedure: COLONOSCOPY WITH PROPOFOL;  Surgeon: Earline Mayotte, MD;  Location: [facility] ENDOSCOPY;  Service: Endoscopy;  Laterality: N/A;  . LAPAROTOMY N/A 02/05/2016   Procedure:  EXPLORATORY LAPAROTOMY FOR SMALL BOWEL OBSTRUCTION;  Surgeon: Kieth Brightly, MD;  Location: [facility] ORS;  Service: General;  Laterality: N/A;  . LYSIS OF ADHESION  02/05/2016   Procedure: LYSIS OF ADHESION;  Surgeon: Kieth Brightly, MD;  Location: [facility] ORS;  Service: General;;  . SMALL INTESTINE SURGERY  2005   Excision of Meckel's diverticulum  . VEIN REPAIR Left 07/28/2018   Procedure: RADIAL ARTERY LIGATION LEFT;  Surgeon: Annice Needy, MD;  Location: [facility] ORS;  Service: Vascular;  Laterality: Left;     reports that he has never smoked. He has never used smokeless tobacco. He reports previous alcohol use. He reports that he does not use drugs.  No Known Allergies  Family History  Problem Relation Age of Onset  . Heart disease Mother   . Hypertension Mother   . Diabetes Mother   . Diabetes Sister   . Diabetes Brother       Prior to Admission medications   Medication Sig Start Date End Date Taking? Authorizing Provider  ASPIRIN LOW DOSE 81 MG EC tablet TAKE 1 TABLET BY MOUTH DAILY 09/01/19  Yes Georgiana Spinner, NP  Aspirin-Salicylamide-Caffeine (BC HEADACHE POWDER PO) Take 1 packet by mouth daily as needed.   Yes [provider]  lisinopril (ZESTRIL) 10 MG tablet Take 1 tablet (10 mg total) by mouth daily. 01/27/19  Yes Galen Manila, NP  metFORMIN (GLUCOPHAGE) 500 MG tablet Take 1 tablet (500 mg total) by mouth 2 (two) times daily  with a meal. 01/27/19  Yes Galen Manila, NP  rosuvastatin (CRESTOR) 5 MG tablet Take 5 mg by mouth daily. 03/02/20  Yes [provider]  sildenafil (REVATIO) 20 MG tablet TAKE 1-3 TABLETS (20-60 MG TOTAL) BY MOUTH AS NEEDED (TO OBTAIN AN ERECTION). 05/06/19  Yes Smitty Cords, DO    Physical Exam: Vitals:   03/10/20 0130 03/10/20 0200 03/10/20 0300 03/10/20 0330  BP: (!) 134/92 (!) 119/92 110/77 112/84  Pulse: 99 100 (!) 106 (!) 106  Resp: 18     Temp:      TempSrc:      SpO2: 96% 94% 94% 95%    Weight:      Height:         Vitals:   03/10/20 0130 03/10/20 0200 03/10/20 0300 03/10/20 0330  BP: (!) 134/92 (!) 119/92 110/77 112/84  Pulse: 99 100 (!) 106 (!) 106  Resp: 18     Temp:      TempSrc:      SpO2: 96% 94% 94% 95%  Weight:      Height:          Constitutional: Alert and oriented x 3 .  Ill-appearing.  NG tube in HEENT:      Head: Normocephalic and atraumatic.         Eyes: PERLA, EOMI, Conjunctivae are normal. Sclera is non-icteric.       Mouth/Throat: Mucous membranes are moist.       Neck: Supple with no signs of meningismus. Cardiovascular: Regular rate and rhythm. No murmurs, gallops, or rubs. 2+ symmetrical distal pulses are present . No JVD. No LE edema Respiratory: Respiratory effort normal .Lungs sounds clear bilaterally. No wheezes, crackles, or rhonchi.  Gastrointestinal:  Distended, tender in lower abdomen.  Sluggish bowel sounds genitourinary: No CVA tenderness. Musculoskeletal: Nontender with normal range of motion in all extremities. No cyanosis, or erythema of extremities. Neurologic:  Face is symmetric. Moving all extremities. No gross focal neurologic deficits . Skin: Skin is warm, dry.  No rash or ulcers Psychiatric: Mood and affect are normal    Labs on Admission: I have personally reviewed following labs and imaging studies  CBC: Recent Labs  Lab 03/10/20 0034  WBC 15.1*  HGB 16.5  HCT 47.9  MCV 89.2  PLT 255   Basic Metabolic Panel: Recent Labs  Lab 03/10/20 0034  NA 134*  K 4.4  CL 95*  CO2 26  GLUCOSE 280*  BUN 12  CREATININE 0.92  CALCIUM 9.7   GFR: Estimated Creatinine Clearance: 82.8 mL/min (by C-G formula based on SCr of 0.92 mg/dL). Liver Function Tests: Recent Labs  Lab 03/10/20 0034  AST 41  ALT 60*  ALKPHOS 56  BILITOT 1.0  PROT 7.6  ALBUMIN 4.3   Recent Labs  Lab 03/10/20 0034  LIPASE 22   No results for input(s): AMMONIA in the last 168 hours. Coagulation Profile: No results for input(s):  INR, PROTIME in the last 168 hours. Cardiac Enzymes: No results for input(s): CKTOTAL, CKMB, CKMBINDEX, TROPONINI in the last 168 hours. BNP (last 3 results) No results for input(s): PROBNP in the last 8760 hours. HbA1C: No results for input(s): HGBA1C in the last 72 hours. CBG: No results for input(s): GLUCAP in the last 168 hours. Lipid Profile: No results for input(s): CHOL, HDL, LDLCALC, TRIG, CHOLHDL, LDLDIRECT in the last 72 hours. Thyroid Function Tests: No results for input(s): TSH, T4TOTAL, FREET4, T3FREE, THYROIDAB in the last 72 hours. Anemia Panel: No  results for input(s): VITAMINB12, FOLATE, FERRITIN, TIBC, IRON, RETICCTPCT in the last 72 hours. Urine analysis:    Component Value Date/Time   COLORURINE YELLOW (A) 09/27/2015 0655   APPEARANCEUR CLEAR (A) 09/27/2015 0655   APPEARANCEUR Clear 07/15/2014 1600   LABSPEC 1.037 (H) 09/27/2015 0655   LABSPEC 1.040 07/15/2014 1600   PHURINE 5.0 09/27/2015 0655   GLUCOSEU >500 (A) 09/27/2015 0655   GLUCOSEU 50 mg/dL 04/02/7587 3254   HGBUR NEGATIVE 09/27/2015 0655   BILIRUBINUR NEGATIVE 09/27/2015 0655   BILIRUBINUR Negative 07/15/2014 1600   KETONESUR 2+ (A) 09/27/2015 0655   PROTEINUR NEGATIVE 09/27/2015 0655   NITRITE NEGATIVE 09/27/2015 0655   LEUKOCYTESUR NEGATIVE 09/27/2015 0655   LEUKOCYTESUR Negative 07/15/2014 1600    Radiological Exams on Admission: DG Abdomen 1 View  Result Date: 03/10/2020 CLINICAL DATA:  NG tube placement EXAM: ABDOMEN - 1 VIEW COMPARISON:  CT from same day FINDINGS: The enteric tube tip projects over the gastric body. There is no definite free air. The patient's previously demonstrated small-bowel obstruction is better visualized on the prior CT. IMPRESSION: Enteric tube projects over the gastric body. Electronically Signed   By: Katherine Mantle M.D.   On: 03/10/2020 03:38   CT ABDOMEN PELVIS W CONTRAST  Result Date: 03/10/2020 CLINICAL DATA:  Nausea and vomiting since last night.  EXAM: CT ABDOMEN AND PELVIS WITH CONTRAST TECHNIQUE: Multidetector CT imaging of the abdomen and pelvis was performed using the standard protocol following bolus administration of intravenous contrast. CONTRAST:  OMNIPAQUE IOHEXOL 300 MG/ML  SOLN COMPARISON:  CT chest 02/06/2016.  CT abdomen and pelvis 09/27/2015 FINDINGS: Lower chest: The lung bases are clear. Hepatobiliary: No focal liver abnormality is seen. Status post cholecystectomy. No biliary dilatation. Diffuse fatty infiltration of the liver. Pancreas: Unremarkable. No pancreatic ductal dilatation or surrounding inflammatory changes. Spleen: Normal in size without focal abnormality. Adrenals/Urinary Tract: Cyst in the upper pole left kidney. Adrenal glands, kidneys, ureters, and bladder are otherwise normal. Stomach/Bowel: Moderate fluid distended stomach without wall thickening. Dilated fluid-filled mid/distal small bowel with decompressed terminal ileum. Transition zone appears to be at a surgical anastomosis in the distal ileum. The colon is not abnormally distended with scattered stool present. The appendix is normal. Vascular/Lymphatic: Aortic atherosclerosis. No enlarged abdominal or pelvic lymph nodes. Reproductive: Prostate is unremarkable. Other: Small amount of free fluid in the pelvis. No free air. Abdominal wall musculature appears intact. Musculoskeletal: No acute or significant osseous findings. IMPRESSION: 1. Small bowel obstruction with transition zone at a surgical anastomosis in the distal ileum. 2. Small amount of free fluid in the pelvis. 3. Diffuse fatty infiltration of the liver. 4. Aortic atherosclerosis. Aortic Atherosclerosis (ICD10-I70.0). Electronically Signed   By: Burman Nieves M.D.   On: 03/10/2020 02:34     Assessment/Plan 59 year old male with history of DM, HTN, partial small bowel resection for Meckel's diverticulum with history of recurrent episodes of small bowel obstruction who presents to the emergency  room with several hour history of abdominal pain and distention and vomiting.  SBO on CT abdomen.    SBO (small bowel obstruction) (HCC)   History of resection of small bowel for Meckel's diverticulum   History of recurrent SBO at anastomotic site -Patient with abdominal pain and distention and vomiting typical for prior SBO's -CT abdomen and pelvis showingSmall bowel obstruction with transition zone at a surgical anastomosis in the distal ileum -Continue NG tube -IV fluids, IV pain meds and IV antiemetics -Surgical consult    Controlled diabetes mellitus  type II without complication (HCC) -Sliding scale insulin coverage    Benign essential hypertension -IV labetalol as needed while n.p.o.    DVT prophylaxis: Lovenox  Code Status: full code  Family Communication:  none  Disposition Plan: Back to previous home environment Consults called: Surgery Status:At the time of admission, it appears that the appropriate admission status for this patient is INPATIENT. This is judged to be reasonable and necessary in order to provide the required intensity of service to ensure the patient's safety given the presenting symptoms, physical exam findings, and initial radiographic and laboratory data in the context of their  Comorbid conditions.   Patient requires inpatient status due to high intensity of service, high risk for further deterioration and high frequency of surveillance required.   I certify that at the point of admission it is my clinical judgment that the patient will require inpatient hospital care spanning beyond 2 midnights      Andris BaumannHazel V Haifa Hatton MD [facility]     03/10/2020, 3:56 AM

## 2020-03-10 NOTE — ED Notes (Signed)
Pt returned from CT. Pt back on monitor. Pt denies needs at this time.

## 2020-03-10 NOTE — ED Notes (Addendum)
Pt abd distended  1 very large episode of vomiting.

## 2020-03-10 NOTE — Progress Notes (Signed)
Patient arrived on unit.

## 2020-03-10 NOTE — Plan of Care (Signed)

## 2020-03-10 NOTE — ED Triage Notes (Signed)
Pt presents w/ c/o nausea since 1900 last night and vomiting x 1prior to arrival per EMS report. Pt denies other sxs. Pt is hyperglycemic per EMS.

## 2020-03-11 ENCOUNTER — Inpatient Hospital Stay: Payer: BC Managed Care – PPO

## 2020-03-11 LAB — BASIC METABOLIC PANEL
Anion gap: 8 (ref 5–15)
BUN: 13 mg/dL (ref 6–20)
CO2: 28 mmol/L (ref 22–32)
Calcium: 8.6 mg/dL — ABNORMAL LOW (ref 8.9–10.3)
Chloride: 98 mmol/L (ref 98–111)
Creatinine, Ser: 1.01 mg/dL (ref 0.61–1.24)
GFR, Estimated: >60 mL/min (ref >60)
Glucose, Bld: 167 mg/dL — ABNORMAL HIGH (ref 70–99)
Potassium: 3.7 mmol/L (ref 3.5–5.1)
Sodium: 134 mmol/L — ABNORMAL LOW (ref 135–145)

## 2020-03-11 LAB — GLUCOSE, CAPILLARY
Glucose-Capillary: 194 mg/dL — ABNORMAL HIGH (ref 70–99)
Glucose-Capillary: 157 mg/dL — ABNORMAL HIGH (ref 70–99)
Glucose-Capillary: 141 mg/dL — ABNORMAL HIGH (ref 70–99)
Glucose-Capillary: 132 mg/dL — ABNORMAL HIGH (ref 70–99)
Glucose-Capillary: 119 mg/dL — ABNORMAL HIGH (ref 70–99)
Glucose-Capillary: 130 mg/dL — ABNORMAL HIGH (ref 70–99)
Glucose-Capillary: 123 mg/dL — ABNORMAL HIGH (ref 70–99)

## 2020-03-11 LAB — CBC
HCT: 42.2 % (ref 39.0–52.0)
Hemoglobin: 14.6 g/dL (ref 13.0–17.0)
MCH: 31.1 pg (ref 26.0–34.0)
MCHC: 34.6 g/dL (ref 30.0–36.0)
MCV: 90 fL (ref 80.0–100.0)
Platelets: 200 10*3/uL (ref 150–400)
RBC: 4.69 MIL/uL (ref 4.22–5.81)
RDW: 12.2 % (ref 11.5–15.5)
WBC: 10.7 10*3/uL — ABNORMAL HIGH (ref 4.0–10.5)
nRBC: 0 % (ref 0.0–0.2)

## 2020-03-11 LAB — MAGNESIUM: Magnesium: 2 mg/dL (ref 1.7–2.4)

## 2020-03-11 MED ORDER — KETOROLAC TROMETHAMINE 15 MG/ML IJ SOLN
15.0000 mg | Freq: Once | INTRAMUSCULAR | Status: AC
  Administered 2020-03-11: 15 mg via INTRAVENOUS
  Filled 2020-03-11: qty 1

## 2020-03-11 NOTE — Progress Notes (Signed)
PROGRESS NOTE    Scott [name]  QIW:[id number] DOB: [birth year] DOA: 03/10/2020 PCP: Danella Penton, MD    Brief Narrative:  59 y.o. male with medical history significant for DM, HTN, partial small bowel resection for Meckel's diverticulum with recurrent episodes of small bowel obstruction who presents to the emergency room with several hour history of abdominal pain and distention and vomiting. Imaging confirmed evidence of SBO with transition zone at surgical anastomosis and distal ileum  Assessment & Plan:   Principal Problem:   SBO (small bowel obstruction) (HCC) Active Problems:   Controlled diabetes mellitus type II without complication (HCC)   Benign essential hypertension   History of resection of small bowel    SBO (small bowel obstruction) (HCC)   History of resection of small bowel for Meckel's diverticulum   History of recurrent SBO at anastomotic site -Patient with abdominal pain and distention and vomiting typical for prior SBO's -CT abdomen and pelvis showingSmall bowel obstruction with transition zone at a surgical anastomosis in the distal ileum -Pt has been continued with NG -Surgery is following, defer further plan to Surgery -Pos BS on exam. Pt is reporting passing flatus -Will repeat CBC and bmet in AM    Controlled diabetes mellitus type II without complication (HCC) -Sliding scale insulin coverage -Glucose trends remained stable    Benign essential hypertension -IV labetalol as needed while n.p.o. -Will continue pt on scheduled IV lopressor with hold parameters -BP stable at this time    DVT prophylaxis: Lovenox subQ Code Status: Full Family Communication: Pt in room, family not at bedside  Status is: Inpatient  Remains inpatient appropriate because:Ongoing diagnostic testing needed not appropriate for outpatient work up, Unsafe d/c plan, IV treatments appropriate due to intensity of illness or inability to take PO and Inpatient level of care  appropriate due to severity of illness   Dispo: The patient is from: Home              Anticipated d/c is to: Home              Anticipated d/c date is: > 3 days              Patient currently is not medically stable to d/c.   Consultants:   General Surgery  Procedures:     Antimicrobials: Anti-infectives (From admission, onward)   None      Subjective: Reports flatus this AM  Objective: Vitals:   03/11/20 0401 03/11/20 0554 03/11/20 0800 03/11/20 1147  BP: (!) 135/102 133/83 (!) 146/110 (!) 149/94  Pulse: 83 76 82 91  Resp:  18 17 16   Temp: 98 F (36.7 C)  97.9 F (36.6 C) 98.2 F (36.8 C)  TempSrc: Oral  Oral   SpO2: 97%  100% 96%  Weight:      Height:        Intake/Output Summary (Last 24 hours) at 03/11/2020 1232 Last data filed at 03/11/2020 1051 Gross per 24 hour  Intake 1000 ml  Output 1650 ml  Net -650 ml   Filed Weights   03/10/20 0023  Weight: 72.6 kg    Examination: General exam: Awake, laying in bed, in nad Respiratory system: Normal respiratory effort, no wheezing Cardiovascular system: regular rate, s1, s2 Gastrointestinal system: Soft, nondistended, positive BS Central nervous system: CN2-12 grossly intact, strength intact Extremities: Perfused, no clubbing Skin: Normal skin turgor, no notable skin lesions seen Psychiatry: Mood normal // no visual hallucinations    Data Reviewed:  I have personally reviewed following labs and imaging studies  CBC: Recent Labs  Lab 03/10/20 0034 03/11/20 0352  WBC 15.1* 10.7*  HGB 16.5 14.6  HCT 47.9 42.2  MCV 89.2 90.0  PLT 255 200   Basic Metabolic Panel: Recent Labs  Lab 03/10/20 0034 03/11/20 0352  NA 134* 134*  K 4.4 3.7  CL 95* 98  CO2 26 28  GLUCOSE 280* 167*  BUN 12 13  CREATININE 0.92 1.01  CALCIUM 9.7 8.6*  MG  --  2.0   GFR: Estimated Creatinine Clearance: 75.4 mL/min (by C-G formula based on SCr of 1.01 mg/dL). Liver Function Tests: Recent Labs  Lab 03/10/20 0034   AST 41  ALT 60*  ALKPHOS 56  BILITOT 1.0  PROT 7.6  ALBUMIN 4.3   Recent Labs  Lab 03/10/20 0034  LIPASE 22   No results for input(s): AMMONIA in the last 168 hours. Coagulation Profile: No results for input(s): INR, PROTIME in the last 168 hours. Cardiac Enzymes: No results for input(s): CKTOTAL, CKMB, CKMBINDEX, TROPONINI in the last 168 hours. BNP (last 3 results) No results for input(s): PROBNP in the last 8760 hours. HbA1C: Recent Labs    03/10/20 0516  HGBA1C 7.4*   CBG: Recent Labs  Lab 03/10/20 2028 03/11/20 0110 03/11/20 0511 03/11/20 0806 03/11/20 1149  GLUCAP 156* 194* 157* 141* 132*   Lipid Profile: No results for input(s): CHOL, HDL, LDLCALC, TRIG, CHOLHDL, LDLDIRECT in the last 72 hours. Thyroid Function Tests: No results for input(s): TSH, T4TOTAL, FREET4, T3FREE, THYROIDAB in the last 72 hours. Anemia Panel: No results for input(s): VITAMINB12, FOLATE, FERRITIN, TIBC, IRON, RETICCTPCT in the last 72 hours. Sepsis Labs: No results for input(s): PROCALCITON, LATICACIDVEN in the last 168 hours.  Recent Results (from the past 240 hour(s))  Resp Panel by RT-PCR (Flu A&B, Covid) Nasopharyngeal Swab     Status: None   Collection Time: 03/10/20  3:29 AM   Specimen: Nasopharyngeal Swab; Nasopharyngeal(NP) swabs in vial transport medium  Result Value Ref Range Status   SARS Coronavirus 2 by RT PCR NEGATIVE NEGATIVE Final    Comment: (NOTE) SARS-CoV-2 target nucleic acids are NOT DETECTED.  The SARS-CoV-2 RNA is generally detectable in upper respiratory specimens during the acute phase of infection. The lowest concentration of SARS-CoV-2 viral copies this assay can detect is 138 copies/mL. A negative result does not preclude SARS-Cov-2 infection and should not be used as the sole basis for treatment or other patient management decisions. A negative result may occur with  improper specimen collection/handling, submission of specimen other than  nasopharyngeal swab, presence of viral mutation(s) within the areas targeted by this assay, and inadequate number of viral copies(<138 copies/mL). A negative result must be combined with clinical observations, patient history, and epidemiological information. The expected result is Negative.  Fact Sheet for Patients:  BloggerCourse.com  Fact Sheet for Healthcare Providers:  SeriousBroker.it  This test is no t yet approved or cleared by the [place] FDA and  has been authorized for detection and/or diagnosis of SARS-CoV-2 by FDA under an Emergency Use Authorization (EUA). This EUA will remain  in effect (meaning this test can be used) for the duration of the COVID-19 declaration under Section 564(b)(1) of the Act, 21 U.S.C.section 360bbb-3(b)(1), unless the authorization is terminated  or revoked sooner.       Influenza A by PCR NEGATIVE NEGATIVE Final   Influenza B by PCR NEGATIVE NEGATIVE Final    Comment: (NOTE) The Xpert Xpress  SARS-CoV-2/FLU/RSV plus assay is intended as an aid in the diagnosis of influenza from Nasopharyngeal swab specimens and should not be used as a sole basis for treatment. Nasal washings and aspirates are unacceptable for Xpert Xpress SARS-CoV-2/FLU/RSV testing.  Fact Sheet for Patients: BloggerCourse.comhttps://www.fda.gov/media/152166/download  Fact Sheet for Healthcare Providers: SeriousBroker.ithttps://www.fda.gov/media/152162/download  This test is not yet approved or cleared by the [place] FDA and has been authorized for detection and/or diagnosis of SARS-CoV-2 by FDA under an Emergency Use Authorization (EUA). This EUA will remain in effect (meaning this test can be used) for the duration of the COVID-19 declaration under Section 564(b)(1) of the Act, 21 U.S.C. section 360bbb-3(b)(1), unless the authorization is terminated or revoked.  Performed at [facility], [address] [place], [place]  [id number]      Radiology Studies: DG Abd 1 View  Result Date: 03/11/2020 CLINICAL DATA:  Small bowel obstruction EXAM: ABDOMEN - 1 VIEW COMPARISON:  March 10, 2020 FINDINGS: Nasogastric tube tip is in the proximal stomach with the side port at the gastroesophageal junction. There remain loops of dilated small bowel. No appreciable air-fluid levels. Note that there is air in the colon and rectum. Surgical clips in the right upper quadrant. Lung bases clear. No abnormal calcifications. IMPRESSION: Nasogastric tube side port is at the gastroesophageal junction. Advise advancing nasogastric tube 5-6 cm. Loops of dilated small bowel remain with air also noted in portions of colon and rectum. A degree of incomplete small bowel obstruction is questioned. Ileus could present similarly. No free air. Lung bases clear. Electronically Signed   By: Bretta BangWilliam  Woodruff [name] M.D.   On: 03/11/2020 08:19   DG Abdomen 1 View  Result Date: 03/10/2020 CLINICAL DATA:  NG tube placement EXAM: ABDOMEN - 1 VIEW COMPARISON:  CT from same day FINDINGS: The enteric tube tip projects over the gastric body. There is no definite free air. The patient's previously demonstrated small-bowel obstruction is better visualized on the prior CT. IMPRESSION: Enteric tube projects over the gastric body. Electronically Signed   By: Katherine Mantlehristopher  Green M.D.   On: 03/10/2020 03:38   CT ABDOMEN PELVIS W CONTRAST  Result Date: 03/10/2020 CLINICAL DATA:  Nausea and vomiting since last night. EXAM: CT ABDOMEN AND PELVIS WITH CONTRAST TECHNIQUE: Multidetector CT imaging of the abdomen and pelvis was performed using the standard protocol following bolus administration of intravenous contrast. CONTRAST:  100mL OMNIPAQUE IOHEXOL 300 MG/ML  SOLN COMPARISON:  CT chest 02/06/2016.  CT abdomen and pelvis 09/27/2015 FINDINGS: Lower chest: The lung bases are clear. Hepatobiliary: No focal liver abnormality is seen. Status post cholecystectomy. No biliary  dilatation. Diffuse fatty infiltration of the liver. Pancreas: Unremarkable. No pancreatic ductal dilatation or surrounding inflammatory changes. Spleen: Normal in size without focal abnormality. Adrenals/Urinary Tract: Cyst in the upper pole left kidney. Adrenal glands, kidneys, ureters, and bladder are otherwise normal. Stomach/Bowel: Moderate fluid distended stomach without wall thickening. Dilated fluid-filled mid/distal small bowel with decompressed terminal ileum. Transition zone appears to be at a surgical anastomosis in the distal ileum. The colon is not abnormally distended with scattered stool present. The appendix is normal. Vascular/Lymphatic: Aortic atherosclerosis. No enlarged abdominal or pelvic lymph nodes. Reproductive: Prostate is unremarkable. Other: Small amount of free fluid in the pelvis. No free air. Abdominal wall musculature appears intact. Musculoskeletal: No acute or significant osseous findings. IMPRESSION: 1. Small bowel obstruction with transition zone at a surgical anastomosis in the distal ileum. 2. Small amount of free fluid in the pelvis. 3. Diffuse  fatty infiltration of the liver. 4. Aortic atherosclerosis. Aortic Atherosclerosis (ICD10-I70.0). Electronically Signed   By: Burman Nieves M.D.   On: 03/10/2020 02:34    Scheduled Meds: . enoxaparin (LOVENOX) injection  40 mg Subcutaneous Q24H  . influenza vac split quadrivalent PF  0.5 mL Intramuscular Tomorrow-1000  . insulin aspart  0-9 Units Subcutaneous Q4H  . metoprolol tartrate  5 mg Intravenous Q6H   Continuous Infusions: . lactated ringers 125 mL/hr at 03/11/20 0852     LOS: 1 day   Rickey Barbara, MD [facility] Pager On Amion  If 7PM-7AM, please contact night-coverage 03/11/2020, 12:32 PM

## 2020-03-11 NOTE — Progress Notes (Signed)
03/11/2020  Subjective: No acute events.  Patient feels better this morning.  He started having some flatus today.  NG output was recorded for 450 but I think this is also not including the ED output.  Reports that his distention and pain are better as well.  KUB done this morning shows still some dilated loops of small bowel but the area is more diffuse with now some air noted in the colon and rectum.  Vital signs: Temp:  [97.9 F (36.6 C)-98.5 F (36.9 C)] 98.2 F (36.8 C) (11/28 1147) Pulse Rate:  [76-113] 91 (11/28 1147) Resp:  [16-18] 16 (11/28 1147) BP: (133-165)/(83-115) 149/94 (11/28 1147) SpO2:  [92 %-100 %] 96 % (11/28 1147)   Intake/Output: 11/27 0701 - 11/28 0700 In: 1000 [I.V.:1000] Out: 1450 [Urine:1000; Emesis/NG output:450] Last BM Date: 03/09/20  Physical Exam: Constitutional: No acute distress Abdomen: Soft, with improved distention, with improved soreness to palpation in the right lower quadrant.  Labs:  Recent Labs    03/10/20 0034 03/11/20 0352  WBC 15.1* 10.7*  HGB 16.5 14.6  HCT 47.9 42.2  PLT 255 200   Recent Labs    03/10/20 0034 03/11/20 0352  NA 134* 134*  K 4.4 3.7  CL 95* 98  CO2 26 28  GLUCOSE 280* 167*  BUN 12 13  CREATININE 0.92 1.01  CALCIUM 9.7 8.6*   No results for input(s): LABPROT, INR in the last 72 hours.  Imaging: DG Abd 1 View  Result Date: 03/11/2020 CLINICAL DATA:  Small bowel obstruction EXAM: ABDOMEN - 1 VIEW COMPARISON:  March 10, 2020 FINDINGS: Nasogastric tube tip is in the proximal stomach with the side port at the gastroesophageal junction. There remain loops of dilated small bowel. No appreciable air-fluid levels. Note that there is air in the colon and rectum. Surgical clips in the right upper quadrant. Lung bases clear. No abnormal calcifications. IMPRESSION: Nasogastric tube side port is at the gastroesophageal junction. Advise advancing nasogastric tube 5-6 cm. Loops of dilated small bowel remain with air  also noted in portions of colon and rectum. A degree of incomplete small bowel obstruction is questioned. Ileus could present similarly. No free air. Lung bases clear. Electronically Signed   By: Bretta Bang III M.D.   On: 03/11/2020 08:19    Assessment/Plan: This is a 59 y.o. male with small bowel obstruction, improving.  -We will continue his NG tube to suction today and n.p.o. diet with IV fluid hydration.  I think if he continues to improve at the rate he is doing, will be able to DC the NG tube tomorrow pending a clamping trial. -We will continue following along with you.   Howie Ill, MD [facility]

## 2020-03-12 LAB — CBC
HCT: 43.6 % (ref 39.0–52.0)
Hemoglobin: 14.8 g/dL (ref 13.0–17.0)
MCH: 31.2 pg (ref 26.0–34.0)
MCHC: 33.9 g/dL (ref 30.0–36.0)
MCV: 92 fL (ref 80.0–100.0)
Platelets: 194 10*3/uL (ref 150–400)
RBC: 4.74 MIL/uL (ref 4.22–5.81)
RDW: 12.1 % (ref 11.5–15.5)
WBC: 8.8 10*3/uL (ref 4.0–10.5)
nRBC: 0 % (ref 0.0–0.2)

## 2020-03-12 LAB — BASIC METABOLIC PANEL
Anion gap: 9 (ref 5–15)
BUN: 11 mg/dL (ref 6–20)
CO2: 29 mmol/L (ref 22–32)
Calcium: 8.9 mg/dL (ref 8.9–10.3)
Chloride: 100 mmol/L (ref 98–111)
Creatinine, Ser: 0.86 mg/dL (ref 0.61–1.24)
GFR, Estimated: >60 mL/min (ref >60)
Glucose, Bld: 142 mg/dL — ABNORMAL HIGH (ref 70–99)
Potassium: 4.4 mmol/L (ref 3.5–5.1)
Sodium: 138 mmol/L (ref 135–145)

## 2020-03-12 LAB — MAGNESIUM: Magnesium: 2.2 mg/dL (ref 1.7–2.4)

## 2020-03-12 LAB — GLUCOSE, CAPILLARY
Glucose-Capillary: 101 mg/dL — ABNORMAL HIGH (ref 70–99)
Glucose-Capillary: 103 mg/dL — ABNORMAL HIGH (ref 70–99)
Glucose-Capillary: 119 mg/dL — ABNORMAL HIGH (ref 70–99)
Glucose-Capillary: 124 mg/dL — ABNORMAL HIGH (ref 70–99)
Glucose-Capillary: 132 mg/dL — ABNORMAL HIGH (ref 70–99)
Glucose-Capillary: 98 mg/dL (ref 70–99)

## 2020-03-12 NOTE — Plan of Care (Signed)
  Problem: Coping: Goal: Level of anxiety will decrease Outcome: Progressing   Problem: Elimination: Goal: Will not experience complications related to bowel motility Outcome: Progressing Goal: Will not experience complications related to urinary retention Outcome: Progressing   Problem: Pain Managment: Goal: General experience of comfort will improve Outcome: Progressing   

## 2020-03-12 NOTE — Progress Notes (Signed)
[facility] SURGICAL PROGRESS NOTE (cpt [id number])  Hospital Day(s): 2.   Interval History: Patient seen and examined, no acute events or new complaints overnight. Patient reports he feels slightly better but still with umbilical tendermess and persistent, but improved, distension. He denies fever, chills, nausea, emesis. Previously seen leukocytosis is now resolved with WBC at 8.8K. Renal function remains normal, sCr - 0.86, UO - 1.0L + unmeasured. No significant electrolyte derangements. NGT with 1400 ccs out in the last 24 hours. He reports some flatus yesterday but no BM.   Review of Systems:  Constitutional: denies fever, chills  HEENT: denies cough or congestion  Respiratory: denies any shortness of breath  Cardiovascular: denies chest pain or palpitations  Gastrointestinal: + abdominal pain, + distension, denied N/V, or diarrhea/and bowel function as per interval history Genitourinary: denies burning with urination or urinary frequency Musculoskeletal: denies pain, decreased motor or sensation   Vital signs in last 24 hours: [min-max] current  Temp:  [98.1 F (36.7 C)-99.3 F (37.4 C)] 98.4 F (36.9 C) (11/29 0742) Pulse Rate:  [74-98] 74 (11/29 0742) Resp:  [15-19] 18 (11/29 0742) BP: (122-172)/(82-110) 147/101 (11/29 0742) SpO2:  [95 %-100 %] 98 % (11/29 0742)     Height: 5\' 7"  (170.2 cm) Weight: 72.6 kg BMI (Calculated): 25.05   Intake/Output last 2 shifts:  11/28 0701 - 11/29 0700 In: 1500 [I.V.:1500] Out: 2450 [Urine:1050; Emesis/NG output:1400]   Physical Exam:  Constitutional: alert, cooperative and no distress  HENT: normocephalic without obvious abnormality, NGT in place Eyes: PERRL, EOM's grossly intact and symmetric  Respiratory: breathing non-labored at rest  Cardiovascular: regular rate and sinus rhythm  Gastrointestinal: Soft, periumbilical soreness, mild distension, no rebound/guarding Musculoskeletal: UE and LE FROM, no edema or wounds, motor  and sensation grossly intact, NT    Labs:  CBC Latest Ref Rng & Units 03/12/2020 03/11/2020 03/10/2020  WBC 4.0 - 10.5 K/uL 8.8 10.7(H) 15.1(H)  Hemoglobin 13.0 - 17.0 g/dL 03/12/2020 32.9 51.8  Hematocrit 39 - 52 % 43.6 42.2 47.9  Platelets 150 - 400 K/uL 194 200 255   CMP Latest Ref Rng & Units 03/12/2020 03/11/2020 03/10/2020  Glucose 70 - 99 mg/dL 03/12/2020) 660(Y) 301(S)  BUN 6 - 20 mg/dL 11 13 12   Creatinine 0.61 - 1.24 mg/dL 010(X 3.23  Sodium 135 - 145 mmol/L 138 134(L) 134(L)  Potassium 3.5 - 5.1 mmol/L 4.4 3.7 4.4  Chloride 98 - 111 mmol/L 100 98 95(L)  CO2 22 - 32 mmol/L 29 28 26   Calcium 8.9 - 10.3 mg/dL 8.9 5.57) 9.7  Total Protein 6.5 - 8.1 g/dL - - 7.6  Total Bilirubin 0.3 - 1.2 mg/dL - - 1.0  Alkaline Phos 38 - 126 U/L - - 56  AST 15 - 41 U/L - - 41  ALT 0 - 44 U/L - - 60(H)     Imaging studies: No new pertinent imaging studies   Assessment/Plan: (ICD-10's: K85.609) 59 y.o. male with clinically persistent SBO most likely secondary to post-surgical adhesive disease.   - Given the high output from NGT and lack of significant clinical improvement, we will continue NGT to LIS decompression for now. If he continues to pass flatus today and feels some improvement, we could consider clamping trial this afternoon.    - Continue NPO + IVF resuscitation   - Monitor renal function, electrolytes while NPO  - If he fails to make clinical improvement we could consider gastrografin challenge in next 24-48 hours   -  Monitor abdominal examination; on-going bowel function  - Pain control prn (minimize narcotics if feasible); antiemetics prn   - Mobilization encouraged   - further management per primary service; we will follow   All of the above findings and recommendations were discussed with the patient, and the medical team, and all of patient's questions were answered to his expressed satisfaction.  -- Lynden Oxford, PA-C [facility] 03/12/2020, 9:38  AM [phone] M-F: 7am - 4pm

## 2020-03-12 NOTE — Progress Notes (Signed)
PROGRESS NOTE    Scott [name]  TWS:[id number] DOB: [birth year] DOA: 03/10/2020 PCP: Danella Penton, MD    Brief Narrative:  59 y.o. male with medical history significant for DM, HTN, partial small bowel resection for Meckel's diverticulum with recurrent episodes of small bowel obstruction who presents to the emergency room with several hour history of abdominal pain and distention and vomiting. Imaging confirmed evidence of SBO with transition zone at surgical anastomosis and distal ileum  Assessment & Plan:   Principal Problem:   SBO (small bowel obstruction) (HCC) Active Problems:   Controlled diabetes mellitus type II without complication (HCC)   Benign essential hypertension   History of resection of small bowel    SBO (small bowel obstruction) (HCC)   History of resection of small bowel for Meckel's diverticulum   History of recurrent SBO at anastomotic site -Patient with abdominal pain and distention and vomiting typical for prior SBO's -CT abdomen and pelvis showingSmall bowel obstruction with transition zone at a surgical anastomosis in the distal ileum -Pt has been continued with NG -Surgery is following. Still with ample NG output. Plan for possible clamping NG tomorrow    Controlled diabetes mellitus type II without complication (HCC) -Sliding scale insulin coverage -Glucose trends still stable    Benign essential hypertension -IV labetalol as needed while n.p.o. -Anticipate resuming oral meds when pt is able to tolerate PO -Will continue pt on scheduled IV lopressor with hold parameters -BP stable at this time    DVT prophylaxis: Lovenox subQ Code Status: Full Family Communication: Pt in room, family not at bedside  Status is: Inpatient  Remains inpatient appropriate because:Ongoing diagnostic testing needed not appropriate for outpatient work up, Unsafe d/c plan, IV treatments appropriate due to intensity of illness or inability to take PO and  Inpatient level of care appropriate due to severity of illness   Dispo: The patient is from: Home              Anticipated d/c is to: Home              Anticipated d/c date is: > 3 days              Patient currently is not medically stable to d/c.   Consultants:   General Surgery  Procedures:     Antimicrobials: Anti-infectives (From admission, onward)   None      Subjective: Denies abd pain. Still with ample NG output  Objective: Vitals:   03/12/20 0100 03/12/20 0351 03/12/20 0742 03/12/20 1131  BP: 122/82 (!) 140/99 (!) 147/101 (!) 146/101  Pulse: 78 84 74 85  Resp: 15 16 18 18   Temp:  98.1 F (36.7 C) 98.4 F (36.9 C) 98.4 F (36.9 C)  TempSrc:  Oral    SpO2:  98% 98% 97%  Weight:      Height:        Intake/Output Summary (Last 24 hours) at 03/12/2020 1312 Last data filed at 03/12/2020 1057 Gross per 24 hour  Intake 1500 ml  Output 2850 ml  Net -1350 ml   Filed Weights   03/10/20 0023  Weight: 72.6 kg    Examination: General exam: Conversant, in no acute distress Respiratory system: normal chest rise, clear, no audible wheezing Cardiovascular system: regular rhythm, s1-s2 Gastrointestinal system: Nondistended, nontender, pos BS, NG in place Central nervous system: No seizures, no tremors Extremities: No cyanosis, no joint deformities Skin: No rashes, no pallor Psychiatry: Affect normal // no auditory hallucinations  Data Reviewed: I have personally reviewed following labs and imaging studies  CBC: Recent Labs  Lab 03/10/20 0034 03/11/20 0352 03/12/20 0504  WBC 15.1* 10.7* 8.8  HGB 16.5 14.6 14.8  HCT 47.9 42.2 43.6  MCV 89.2 90.0 92.0  PLT 255 200 194   Basic Metabolic Panel: Recent Labs  Lab 03/10/20 0034 03/11/20 0352 03/12/20 0504  NA 134* 134* 138  K 4.4 3.7 4.4  CL 95* 98 100  CO2 26 28 29   GLUCOSE 280* 167* 142*  BUN 12 13 11   CREATININE 0.92 1.01 0.86  CALCIUM 9.7 8.6* 8.9  MG  --  2.0 2.2   GFR: Estimated  Creatinine Clearance: 88.6 mL/min (by C-G formula based on SCr of 0.86 mg/dL). Liver Function Tests: Recent Labs  Lab 03/10/20 0034  AST 41  ALT 60*  ALKPHOS 56  BILITOT 1.0  PROT 7.6  ALBUMIN 4.3   Recent Labs  Lab 03/10/20 0034  LIPASE 22   No results for input(s): AMMONIA in the last 168 hours. Coagulation Profile: No results for input(s): INR, PROTIME in the last 168 hours. Cardiac Enzymes: No results for input(s): CKTOTAL, CKMB, CKMBINDEX, TROPONINI in the last 168 hours. BNP (last 3 results) No results for input(s): PROBNP in the last 8760 hours. HbA1C: Recent Labs    03/10/20 0516  HGBA1C 7.4*   CBG: Recent Labs  Lab 03/11/20 2153 03/12/20 0006 03/12/20 0349 03/12/20 0743 03/12/20 1147  GLUCAP 123* 124* 119* 132* 103*   Lipid Profile: No results for input(s): CHOL, HDL, LDLCALC, TRIG, CHOLHDL, LDLDIRECT in the last 72 hours. Thyroid Function Tests: No results for input(s): TSH, T4TOTAL, FREET4, T3FREE, THYROIDAB in the last 72 hours. Anemia Panel: No results for input(s): VITAMINB12, FOLATE, FERRITIN, TIBC, IRON, RETICCTPCT in the last 72 hours. Sepsis Labs: No results for input(s): PROCALCITON, LATICACIDVEN in the last 168 hours.  Recent Results (from the past 240 hour(s))  Resp Panel by RT-PCR (Flu A&B, Covid) Nasopharyngeal Swab     Status: None   Collection Time: 03/10/20  3:29 AM   Specimen: Nasopharyngeal Swab; Nasopharyngeal(NP) swabs in vial transport medium  Result Value Ref Range Status   SARS Coronavirus 2 by RT PCR NEGATIVE NEGATIVE Final    Comment: (NOTE) SARS-CoV-2 target nucleic acids are NOT DETECTED.  The SARS-CoV-2 RNA is generally detectable in upper respiratory specimens during the acute phase of infection. The lowest concentration of SARS-CoV-2 viral copies this assay can detect is 138 copies/mL. A negative result does not preclude SARS-Cov-2 infection and should not be used as the sole basis for treatment or other patient  management decisions. A negative result may occur with  improper specimen collection/handling, submission of specimen other than nasopharyngeal swab, presence of viral mutation(s) within the areas targeted by this assay, and inadequate number of viral copies(<138 copies/mL). A negative result must be combined with clinical observations, patient history, and epidemiological information. The expected result is Negative.  Fact Sheet for Patients:  03/14/20  Fact Sheet for Healthcare Providers:  03/12/20  This test is no t yet approved or cleared by the [employer] FDA and  has been authorized for detection and/or diagnosis of SARS-CoV-2 by FDA under an Emergency Use Authorization (EUA). This EUA will remain  in effect (meaning this test can be used) for the duration of the COVID-19 declaration under Section 564(b)(1) of the Act, 21 U.S.C.section 360bbb-3(b)(1), unless the authorization is terminated  or revoked sooner.       Influenza A by PCR  NEGATIVE NEGATIVE Final   Influenza B by PCR NEGATIVE NEGATIVE Final    Comment: (NOTE) The Xpert Xpress SARS-CoV-2/FLU/RSV plus assay is intended as an aid in the diagnosis of influenza from Nasopharyngeal swab specimens and should not be used as a sole basis for treatment. Nasal washings and aspirates are unacceptable for Xpert Xpress SARS-CoV-2/FLU/RSV testing.  Fact Sheet for Patients: BloggerCourse.com  Fact Sheet for Healthcare Providers: SeriousBroker.it  This test is not yet approved or cleared by the [place] FDA and has been authorized for detection and/or diagnosis of SARS-CoV-2 by FDA under an Emergency Use Authorization (EUA). This EUA will remain in effect (meaning this test can be used) for the duration of the COVID-19 declaration under Section 564(b)(1) of the Act, 21 U.S.C. section 360bbb-3(b)(1),  unless the authorization is terminated or revoked.  Performed at [facility], [address] [place], [place] [address]      Radiology Studies: DG Abd 1 View  Result Date: 03/11/2020 CLINICAL DATA:  Small bowel obstruction EXAM: ABDOMEN - 1 VIEW COMPARISON:  March 10, 2020 FINDINGS: Nasogastric tube tip is in the proximal stomach with the side port at the gastroesophageal junction. There remain loops of dilated small bowel. No appreciable air-fluid levels. Note that there is air in the colon and rectum. Surgical clips in the right upper quadrant. Lung bases clear. No abnormal calcifications. IMPRESSION: Nasogastric tube side port is at the gastroesophageal junction. Advise advancing nasogastric tube 5-6 cm. Loops of dilated small bowel remain with air also noted in portions of colon and rectum. A degree of incomplete small bowel obstruction is questioned. Ileus could present similarly. No free air. Lung bases clear. Electronically Signed   By: Bretta Bang [name] M.D.   On: 03/11/2020 08:19    Scheduled Meds:  enoxaparin (LOVENOX) injection  40 mg Subcutaneous Q24H   insulin aspart  0-9 Units Subcutaneous Q4H   metoprolol tartrate  5 mg Intravenous Q6H   Continuous Infusions:  lactated ringers 125 mL/hr at 03/12/20 1031     LOS: 2 days   Rickey Barbara, MD [facility] Pager On Amion  If 7PM-7AM, please contact night-coverage 03/12/2020, 1:12 PM

## 2020-03-13 ENCOUNTER — Inpatient Hospital Stay: Payer: BC Managed Care – PPO

## 2020-03-13 LAB — GLUCOSE, CAPILLARY
Glucose-Capillary: 103 mg/dL — ABNORMAL HIGH (ref 70–99)
Glucose-Capillary: 103 mg/dL — ABNORMAL HIGH (ref 70–99)
Glucose-Capillary: 107 mg/dL — ABNORMAL HIGH (ref 70–99)
Glucose-Capillary: 77 mg/dL (ref 70–99)
Glucose-Capillary: 91 mg/dL (ref 70–99)
Glucose-Capillary: 92 mg/dL (ref 70–99)
Glucose-Capillary: 98 mg/dL (ref 70–99)

## 2020-03-13 LAB — COMPREHENSIVE METABOLIC PANEL
ALT: 25 U/L (ref 0–44)
AST: 19 U/L (ref 15–41)
Albumin: 3.5 g/dL (ref 3.5–5.0)
Alkaline Phosphatase: 50 U/L (ref 38–126)
Anion gap: 11 (ref 5–15)
BUN: 13 mg/dL (ref 6–20)
CO2: 26 mmol/L (ref 22–32)
Calcium: 8.8 mg/dL — ABNORMAL LOW (ref 8.9–10.3)
Chloride: 100 mmol/L (ref 98–111)
Creatinine, Ser: 0.73 mg/dL (ref 0.61–1.24)
GFR, Estimated: >60 mL/min (ref >60)
Glucose, Bld: 109 mg/dL — ABNORMAL HIGH (ref 70–99)
Potassium: 3.9 mmol/L (ref 3.5–5.1)
Sodium: 137 mmol/L (ref 135–145)
Total Bilirubin: 1.2 mg/dL (ref 0.3–1.2)
Total Protein: 6.5 g/dL (ref 6.5–8.1)

## 2020-03-13 MED ORDER — KETOROLAC TROMETHAMINE 30 MG/ML IJ SOLN
30.0000 mg | Freq: Once | INTRAMUSCULAR | Status: AC
  Administered 2020-03-14: 30 mg via INTRAVENOUS
  Filled 2020-03-13: qty 1

## 2020-03-13 NOTE — Progress Notes (Signed)
Hooked patient back to intermittent suction for NG tube

## 2020-03-13 NOTE — Progress Notes (Signed)
Stopped suction to NG tube

## 2020-03-13 NOTE — Progress Notes (Signed)
Readjust suction checked for kinks, output at this time

## 2020-03-13 NOTE — Progress Notes (Signed)
PROGRESS NOTE    Scott [name]  UXN:[id number] DOB: Feb 06, 1961 DOA: 03/10/2020 PCP: Danella Penton, MD    Brief Narrative:  59 y.o. male with medical history significant for DM, HTN, partial small bowel resection for Meckel's diverticulum with recurrent episodes of small bowel obstruction who presents to the emergency room with several hour history of abdominal pain and distention and vomiting. Imaging confirmed evidence of SBO with transition zone at surgical anastomosis and distal ileum  Assessment & Plan:   Principal Problem:   SBO (small bowel obstruction) (HCC) Active Problems:   Controlled diabetes mellitus type II without complication (HCC)   Benign essential hypertension   History of resection of small bowel    SBO (small bowel obstruction) (HCC)   History of resection of small bowel for Meckel's diverticulum   History of recurrent SBO at anastomotic site -Patient with abdominal pain and distention and vomiting typical for prior SBO's -CT abdomen and pelvis showingSmall bowel obstruction with transition zone at a surgical anastomosis in the distal ileum -Pt has been continued with NG -Surgery is following. Pos BS on exam. Trial of clamping NG today per Surgery. If residuals later are <150cc, then plan to d/c NG and initiate diet. If residuals are >150cc, then plan for gastrografin challenge    Controlled diabetes mellitus type II without complication (HCC) -Sliding scale insulin coverage -Glucose trends have remained stable    Benign essential hypertension -IV labetalol as needed while n.p.o. -Anticipate resuming oral meds when pt is able to tolerate PO reliably -Will continue pt on scheduled IV lopressor with hold parameters -BP remains stable at this time    DVT prophylaxis: Lovenox subQ Code Status: Full Family Communication: Pt in room, family not at bedside  Status is: Inpatient  Remains inpatient appropriate because:Ongoing diagnostic testing needed  not appropriate for outpatient work up, Unsafe d/c plan, IV treatments appropriate due to intensity of illness or inability to take PO and Inpatient level of care appropriate due to severity of illness   Dispo: The patient is from: Home              Anticipated d/c is to: Home              Anticipated d/c date is: > 3 days              Patient currently is not medically stable to d/c.   Consultants:   General Surgery  Procedures:     Antimicrobials: Anti-infectives (From admission, onward)   None      Subjective: Reports passing flatus today  Objective: Vitals:   03/12/20 2351 03/13/20 0349 03/13/20 0732 03/13/20 1115  BP: (!) 165/94 138/88 (!) 141/96 (!) 142/93  Pulse: 81 80 76 81  Resp: 17 16 18 16   Temp: 97.9 F (36.6 C) 98.1 F (36.7 C) 98.6 F (37 C) 98.2 F (36.8 C)  TempSrc:      SpO2: 97% 97% 97% 97%  Weight:      Height:        Intake/Output Summary (Last 24 hours) at 03/13/2020 1310 Last data filed at 03/13/2020 0931 Gross per 24 hour  Intake 1681.94 ml  Output 2600 ml  Net -918.06 ml   Filed Weights   03/10/20 0023  Weight: 72.6 kg    Examination: General exam: Awake, laying in bed, in nad Respiratory system: Normal respiratory effort, no wheezing Cardiovascular system: regular rate, s1, s2 Gastrointestinal system: Soft, nondistended, positive BS Central nervous system: CN2-12  grossly intact, strength intact Extremities: Perfused, no clubbing Skin: Normal skin turgor, no notable skin lesions seen Psychiatry: Mood normal // no visual hallucinations    Data Reviewed: I have personally reviewed following labs and imaging studies  CBC: Recent Labs  Lab 03/10/20 0034 03/11/20 0352 03/12/20 0504  WBC 15.1* 10.7* 8.8  HGB 16.5 14.6 14.8  HCT 47.9 42.2 43.6  MCV 89.2 90.0 92.0  PLT 255 200 194   Basic Metabolic Panel: Recent Labs  Lab 03/10/20 0034 03/11/20 0352 03/12/20 0504 03/13/20 0634  NA 134* 134* 138 137  K 4.4 3.7 4.4  3.9  CL 95* 98 100 100  CO2 26 28 29 26   GLUCOSE 280* 167* 142* 109*  BUN 12 13 11 13   CREATININE 0.92 1.01 0.86 0.73  CALCIUM 9.7 8.6* 8.9 8.8*  MG  --  2.0 2.2  --    GFR: Estimated Creatinine Clearance: 95.2 mL/min (by C-G formula based on SCr of 0.73 mg/dL). Liver Function Tests: Recent Labs  Lab 03/10/20 0034 03/13/20 0634  AST 41 19  ALT 60* 25  ALKPHOS 56 50  BILITOT 1.0 1.2  PROT 7.6 6.5  ALBUMIN 4.3 3.5   Recent Labs  Lab 03/10/20 0034  LIPASE 22   No results for input(s): AMMONIA in the last 168 hours. Coagulation Profile: No results for input(s): INR, PROTIME in the last 168 hours. Cardiac Enzymes: No results for input(s): CKTOTAL, CKMB, CKMBINDEX, TROPONINI in the last 168 hours. BNP (last 3 results) No results for input(s): PROBNP in the last 8760 hours. HbA1C: No results for input(s): HGBA1C in the last 72 hours. CBG: Recent Labs  Lab 03/12/20 1938 03/13/20 0004 03/13/20 0350 03/13/20 0732 03/13/20 1156  GLUCAP 98 98 107* 103* 103*   Lipid Profile: No results for input(s): CHOL, HDL, LDLCALC, TRIG, CHOLHDL, LDLDIRECT in the last 72 hours. Thyroid Function Tests: No results for input(s): TSH, T4TOTAL, FREET4, T3FREE, THYROIDAB in the last 72 hours. Anemia Panel: No results for input(s): VITAMINB12, FOLATE, FERRITIN, TIBC, IRON, RETICCTPCT in the last 72 hours. Sepsis Labs: No results for input(s): PROCALCITON, LATICACIDVEN in the last 168 hours.  Recent Results (from the past 240 hour(s))  Resp Panel by RT-PCR (Flu A&B, Covid) Nasopharyngeal Swab     Status: None   Collection Time: 03/10/20  3:29 AM   Specimen: Nasopharyngeal Swab; Nasopharyngeal(NP) swabs in vial transport medium  Result Value Ref Range Status   SARS Coronavirus 2 by RT PCR NEGATIVE NEGATIVE Final    Comment: (NOTE) SARS-CoV-2 target nucleic acids are NOT DETECTED.  The SARS-CoV-2 RNA is generally detectable in upper respiratory specimens during the acute phase of  infection. The lowest concentration of SARS-CoV-2 viral copies this assay can detect is 138 copies/mL. A negative result does not preclude SARS-Cov-2 infection and should not be used as the sole basis for treatment or other patient management decisions. A negative result may occur with  improper specimen collection/handling, submission of specimen other than nasopharyngeal swab, presence of viral mutation(s) within the areas targeted by this assay, and inadequate number of viral copies(<138 copies/mL). A negative result must be combined with clinical observations, patient history, and epidemiological information. The expected result is Negative.  Fact Sheet for Patients:  03/15/20  Fact Sheet for Healthcare Providers:  03/12/20  This test is no t yet approved or cleared by the [employer] FDA and  has been authorized for detection and/or diagnosis of SARS-CoV-2 by FDA under an Emergency Use Authorization (EUA). This EUA  will remain  in effect (meaning this test can be used) for the duration of the COVID-19 declaration under Section 564(b)(1) of the Act, 21 U.S.C.section 360bbb-3(b)(1), unless the authorization is terminated  or revoked sooner.       Influenza A by PCR NEGATIVE NEGATIVE Final   Influenza B by PCR NEGATIVE NEGATIVE Final    Comment: (NOTE) The Xpert Xpress SARS-CoV-2/FLU/RSV plus assay is intended as an aid in the diagnosis of influenza from Nasopharyngeal swab specimens and should not be used as a sole basis for treatment. Nasal washings and aspirates are unacceptable for Xpert Xpress SARS-CoV-2/FLU/RSV testing.  Fact Sheet for Patients: BloggerCourse.com  Fact Sheet for Healthcare Providers: SeriousBroker.it  This test is not yet approved or cleared by the [place] FDA and has been authorized for detection and/or diagnosis of SARS-CoV-2  by FDA under an Emergency Use Authorization (EUA). This EUA will remain in effect (meaning this test can be used) for the duration of the COVID-19 declaration under Section 564(b)(1) of the Act, 21 U.S.C. section 360bbb-3(b)(1), unless the authorization is terminated or revoked.  Performed at [facility], [address] [place], [place] [address]      Radiology Studies: DG Abd 1 View  Result Date: 03/13/2020 CLINICAL DATA:  History of abdominal pain and distention with improvement today. EXAM: ABDOMEN - 1 VIEW COMPARISON:  03/11/2020. FINDINGS: Surgical clips right upper quadrant. Surgical sutures right lower quadrant. NG tube is coiled in the upper most portion of the stomach. Interim resolution of small-bowel distention. Stool noted throughout the colon. No free air. Thoracolumbar degenerative change. Degenerative changes both hips. No acute bony abnormality. IMPRESSION: NG tube coiled in the upper most portion of the stomach. Interim resolution of small-bowel distention. Electronically Signed   By: Maisie Fus  Register   On: 03/13/2020 10:15    Scheduled Meds: . enoxaparin (LOVENOX) injection  40 mg Subcutaneous Q24H  . insulin aspart  0-9 Units Subcutaneous Q4H  . metoprolol tartrate  5 mg Intravenous Q6H   Continuous Infusions: . lactated ringers 125 mL/hr at 03/13/20 1058     LOS: 3 days   Rickey Barbara, MD [facility] Pager On Amion  If 7PM-7AM, please contact night-coverage 03/13/2020, 1:10 PM

## 2020-03-13 NOTE — Progress Notes (Addendum)
[facility] SURGICAL PROGRESS NOTE (cpt [phone])  Hospital Day(s): 3.   Interval History: Patient seen and examined, no acute events or new complaints overnight. Patient reports he is feeling better this morning and his abdominal pain and distension have resolved. He denied any fever, chills, nausea, emesis. Labs are pending this morning. NGT with 1400 ccs out in the last 24 hours but this has become much more clear and less feculent. He continues to endorse about 2-3 episodes of flatus this morning but no BM recorded.   Review of Systems:  Constitutional: denies fever, chills  HEENT: denies cough or congestion  Respiratory: denies any shortness of breath  Cardiovascular: denies chest pain or palpitations  Gastrointestinal: denies abdominal pain, N/V, or diarrhea/and bowel function as per interval history Genitourinary: denies burning with urination or urinary frequency  Vital signs in last 24 hours: [min-max] current  Temp:  [97.9 F (36.6 C)-98.7 F (37.1 C)] 98.1 F (36.7 C) (11/30 0349) Pulse Rate:  [74-95] 80 (11/30 0349) Resp:  [16-18] 16 (11/30 0349) BP: (138-165)/(88-102) 138/88 (11/30 0349) SpO2:  [97 %-99 %] 97 % (11/30 0349)     Height: 5\' 7"  (170.2 cm) Weight: 72.6 kg BMI (Calculated): 25.05   Intake/Output last 2 shifts:  11/29 0701 - 11/30 0700 In: 1681.9 [I.V.:1281.9; NG/GT:400] Out: 3000 [Urine:1600; Emesis/NG output:1400]   Physical Exam:  Constitutional: alert, cooperative and no distress  HENT: normocephalic without obvious abnormality, NGT in place Eyes: PERRL, EOM's grossly intact and symmetric  Respiratory: breathing non-labored at rest  Cardiovascular: regular rate and sinus rhythm  Gastrointestinal: Soft, abdominal tenderness resolved, he is mildly distended but much improved, no rebound/guarding Musculoskeletal: UE and LE FROM, no edema or wounds, motor and sensation grossly intact, NT    Labs:  CBC Latest Ref Rng & Units 03/12/2020  03/11/2020 03/10/2020  WBC 4.0 - 10.5 K/uL 8.8 10.7(H) 15.1(H)  Hemoglobin 13.0 - 17.0 g/dL 03/12/2020 29.5 18.8  Hematocrit 39 - 52 % 43.6 42.2 47.9  Platelets 150 - 400 K/uL 194 200 255   CMP Latest Ref Rng & Units 03/12/2020 03/11/2020 03/10/2020  Glucose 70 - 99 mg/dL 03/12/2020) 606(T) 016(W)  BUN 6 - 20 mg/dL 11 13 12   Creatinine 0.61 - 1.24 mg/dL 109(N 2.35  Sodium 135 - 145 mmol/L 138 134(L) 134(L)  Potassium 3.5 - 5.1 mmol/L 4.4 3.7 4.4  Chloride 98 - 111 mmol/L 100 98 95(L)  CO2 22 - 32 mmol/L 29 28 26   Calcium 8.9 - 10.3 mg/dL 8.9 5.73) 9.7  Total Protein 6.5 - 8.1 g/dL - - 7.6  Total Bilirubin 0.3 - 1.2 mg/dL - - 1.0  Alkaline Phos 38 - 126 U/L - - 56  AST 15 - 41 U/L - - 41  ALT 0 - 44 U/L - - 60(H)    Imaging studies: No new pertinent imaging studies   Assessment/Plan: (ICD-10's: K13.609) 59 y.o. male with evidence of clinically improving SBO most likely secondary to post-surgical adhesive disease.   - I will preform NGT clamping trial this morning given the clinical improvement and return of flatus. We will clamp NGT x4 hours and check residuals. If residuals are less than 150 ccs we will remove NGT and initiate diet. If residuals are more than 150 ccs then I will proceed with gastrografin challenge. He understands and is in agreement. I will also get KUB this morning for more objective reassessment.    - Continue NPO + IVF resuscitation                -  Monitor renal function, electrolytes while NPO   - Monitor abdominal examination; on-going bowel function             - Pain control prn (minimize narcotics if feasible); antiemetics prn              - Mobilization encouraged              - further management per primary service; we will follow    All of the above findings and recommendations were discussed with the patient, and the medical team, and all of patient's questions were answered to his expressed satisfaction.  -- Lynden Oxford, PA-C [facility] 03/13/2020, 7:19 AM [phone] M-F: 7am - 4pm  I saw and evaluated the patient.  I agree with the above documentation, exam, and plan, which I have edited where appropriate. Duanne Guess  12:58 PM

## 2020-03-14 DIAGNOSIS — E119 Type 2 diabetes mellitus without complications: Secondary | ICD-10-CM

## 2020-03-14 LAB — GLUCOSE, CAPILLARY
Glucose-Capillary: 86 mg/dL (ref 70–99)
Glucose-Capillary: 86 mg/dL (ref 70–99)
Glucose-Capillary: 89 mg/dL (ref 70–99)
Glucose-Capillary: 171 mg/dL — ABNORMAL HIGH (ref 70–99)
Glucose-Capillary: 114 mg/dL — ABNORMAL HIGH (ref 70–99)

## 2020-03-14 MED ORDER — METOPROLOL TARTRATE 25 MG PO TABS
25.0000 mg | ORAL_TABLET | Freq: Two times a day (BID) | ORAL | Status: DC
  Administered 2020-03-14 – 2020-03-16 (×4): 25 mg via ORAL
  Filled 2020-03-14 (×4): qty 1

## 2020-03-14 NOTE — Progress Notes (Signed)
Unclamped tube, approx out.

## 2020-03-14 NOTE — Progress Notes (Signed)
At 2015, pt c/o nausea. NG reconnected to low, intermittent suction with thick, brown output. Pt stated "it helped" to ease nausea.

## 2020-03-14 NOTE — Progress Notes (Signed)
PROGRESS NOTE    Scott [name]  ZWC:[id number] DOB: 1960-11-16 DOA: 03/10/2020 PCP: Danella Penton, MD    Brief Narrative:  59 y.o. male with medical history significant for DM, HTN, partial small bowel resection for Meckel's diverticulum with recurrent episodes of small bowel obstruction who presents to the emergency room with several hour history of abdominal pain and distention and vomiting. Imaging confirmed evidence of SBO with transition zone at surgical anastomosis and distal ileum  Assessment & Plan:   Principal Problem:   SBO (small bowel obstruction) (HCC) Active Problems:   Controlled diabetes mellitus type II without complication (HCC)   Benign essential hypertension   History of resection of small bowel    SBO (small bowel obstruction) (HCC)   History of resection of small bowel for Meckel's diverticulum   History of recurrent SBO at anastomotic site -Patient with abdominal pain and distention and vomiting typical for prior SBO's -CT abdomen and pelvis showingSmall bowel obstruction with transition zone at a surgical anastomosis in the distal ileum -NG tube pulled out on 12/1 per surgery recommendations    Controlled diabetes mellitus type II without complication (HCC) -Sliding scale insulin coverage -Glucose trends have remained stable    Benign essential hypertension -We will add oral metoprolol and discontinue IV Lopressor.  Continue as needed labetalol   DVT prophylaxis: Lovenox subQ Code Status: Full Family Communication: Pt in room, family not at bedside  Status is: Inpatient  Remains inpatient appropriate because:Ongoing diagnostic testing needed not appropriate for outpatient work up, Unsafe d/c plan, IV treatments appropriate due to intensity of illness or inability to take PO and Inpatient level of care appropriate due to severity of illness   Dispo: The patient is from: Home              Anticipated d/c is to: Home              Anticipated  d/c date is: 3 days              Patient currently is not medically stable to d/c.  Await diet tolerance and progression in clinical condition   Consultants:   General Surgery  Procedures:     Antimicrobials: Anti-infectives (From admission, onward)   None      Subjective: Hoping to get NG tube removed so that he can eat something.  Denies any other complaints.  He is feeling better  Objective: Vitals:   03/14/20 0400 03/14/20 0825 03/14/20 1142 03/14/20 1553  BP: (!) 129/91 (!) 159/102 (!) 151/99 (!) 150/91  Pulse: 78 71 77 94  Resp:  18 18 17   Temp:  97.8 F (36.6 C) (!) 97.5 F (36.4 C) 97.8 F (36.6 C)  TempSrc:  Oral Oral Oral  SpO2: 97% 98% 97% 98%  Weight:      Height:        Intake/Output Summary (Last 24 hours) at 03/14/2020 1720 Last data filed at 03/14/2020 1522 Gross per 24 hour  Intake 2779.14 ml  Output 700 ml  Net 2079.14 ml   Filed Weights   03/10/20 0023  Weight: 72.6 kg    Examination: General exam: Awake, laying in bed, in nad Respiratory system: Normal respiratory effort, no wheezing Cardiovascular system: regular rate, s1, s2 Gastrointestinal system: Soft, nondistended, positive BS Central nervous system: CN2-12 grossly intact, strength intact Extremities: Perfused, no clubbing Skin: Normal skin turgor, no notable skin lesions seen Psychiatry: Mood normal // no visual hallucinations    Data Reviewed:  I have personally reviewed following labs and imaging studies  CBC: Recent Labs  Lab 03/10/20 0034 03/11/20 0352 03/12/20 0504  WBC 15.1* 10.7* 8.8  HGB 16.5 14.6 14.8  HCT 47.9 42.2 43.6  MCV 89.2 90.0 92.0  PLT 255 200 194   Basic Metabolic Panel: Recent Labs  Lab 03/10/20 0034 03/11/20 0352 03/12/20 0504 03/13/20 0634  NA 134* 134* 138 137  K 4.4 3.7 4.4 3.9  CL 95* 98 100 100  CO2 26 28 29 26   GLUCOSE 280* 167* 142* 109*  BUN 12 13 11 13   CREATININE 0.92 1.01 0.86 0.73  CALCIUM 9.7 8.6* 8.9 8.8*  MG  --  2.0  2.2  --    GFR: Estimated Creatinine Clearance: 95.2 mL/min (by C-G formula based on SCr of 0.73 mg/dL). Liver Function Tests: Recent Labs  Lab 03/10/20 0034 03/13/20 0634  AST 41 19  ALT 60* 25  ALKPHOS 56 50  BILITOT 1.0 1.2  PROT 7.6 6.5  ALBUMIN 4.3 3.5   Recent Labs  Lab 03/10/20 0034  LIPASE 22   No results for input(s): AMMONIA in the last 168 hours. Coagulation Profile: No results for input(s): INR, PROTIME in the last 168 hours. Cardiac Enzymes: No results for input(s): CKTOTAL, CKMB, CKMBINDEX, TROPONINI in the last 168 hours. BNP (last 3 results) No results for input(s): PROBNP in the last 8760 hours. HbA1C: No results for input(s): HGBA1C in the last 72 hours. CBG: Recent Labs  Lab 03/13/20 2349 03/14/20 0402 03/14/20 0811 03/14/20 1140 03/14/20 1640  GLUCAP 77 86 86 89 171*   Lipid Profile: No results for input(s): CHOL, HDL, LDLCALC, TRIG, CHOLHDL, LDLDIRECT in the last 72 hours. Thyroid Function Tests: No results for input(s): TSH, T4TOTAL, FREET4, T3FREE, THYROIDAB in the last 72 hours. Anemia Panel: No results for input(s): VITAMINB12, FOLATE, FERRITIN, TIBC, IRON, RETICCTPCT in the last 72 hours. Sepsis Labs: No results for input(s): PROCALCITON, LATICACIDVEN in the last 168 hours.  Recent Results (from the past 240 hour(s))  Resp Panel by RT-PCR (Flu A&B, Covid) Nasopharyngeal Swab     Status: None   Collection Time: 03/10/20  3:29 AM   Specimen: Nasopharyngeal Swab; Nasopharyngeal(NP) swabs in vial transport medium  Result Value Ref Range Status   SARS Coronavirus 2 by RT PCR NEGATIVE NEGATIVE Final    Comment: (NOTE) SARS-CoV-2 target nucleic acids are NOT DETECTED.  The SARS-CoV-2 RNA is generally detectable in upper respiratory specimens during the acute phase of infection. The lowest concentration of SARS-CoV-2 viral copies this assay can detect is 138 copies/mL. A negative result does not preclude SARS-Cov-2 infection and should  not be used as the sole basis for treatment or other patient management decisions. A negative result may occur with  improper specimen collection/handling, submission of specimen other than nasopharyngeal swab, presence of viral mutation(s) within the areas targeted by this assay, and inadequate number of viral copies(<138 copies/mL). A negative result must be combined with clinical observations, patient history, and epidemiological information. The expected result is Negative.  Fact Sheet for Patients:  14/01/21  Fact Sheet for Healthcare Providers:  03/12/20  This test is no t yet approved or cleared by the [employer] FDA and  has been authorized for detection and/or diagnosis of SARS-CoV-2 by FDA under an Emergency Use Authorization (EUA). This EUA will remain  in effect (meaning this test can be used) for the duration of the COVID-19 declaration under Section 564(b)(1) of the Act, 21 U.S.C.section 360bbb-3(b)(1), unless the  authorization is terminated  or revoked sooner.       Influenza A by PCR NEGATIVE NEGATIVE Final   Influenza B by PCR NEGATIVE NEGATIVE Final    Comment: (NOTE) The Xpert Xpress SARS-CoV-2/FLU/RSV plus assay is intended as an aid in the diagnosis of influenza from Nasopharyngeal swab specimens and should not be used as a sole basis for treatment. Nasal washings and aspirates are unacceptable for Xpert Xpress SARS-CoV-2/FLU/RSV testing.  Fact Sheet for Patients: BloggerCourse.com  Fact Sheet for Healthcare Providers: SeriousBroker.it  This test is not yet approved or cleared by the [place] FDA and has been authorized for detection and/or diagnosis of SARS-CoV-2 by FDA under an Emergency Use Authorization (EUA). This EUA will remain in effect (meaning this test can be used) for the duration of the COVID-19 declaration under  Section 564(b)(1) of the Act, 21 U.S.C. section 360bbb-3(b)(1), unless the authorization is terminated or revoked.  Performed at [facility], [address] [place], [place] [address]      Radiology Studies: DG Abd 1 View  Result Date: 03/13/2020 CLINICAL DATA:  History of abdominal pain and distention with improvement today. EXAM: ABDOMEN - 1 VIEW COMPARISON:  03/11/2020. FINDINGS: Surgical clips right upper quadrant. Surgical sutures right lower quadrant. NG tube is coiled in the upper most portion of the stomach. Interim resolution of small-bowel distention. Stool noted throughout the colon. No free air. Thoracolumbar degenerative change. Degenerative changes both hips. No acute bony abnormality. IMPRESSION: NG tube coiled in the upper most portion of the stomach. Interim resolution of small-bowel distention. Electronically Signed   By: Maisie Fus  Register   On: 03/13/2020 10:15    Scheduled Meds: . enoxaparin (LOVENOX) injection  40 mg Subcutaneous Q24H  . insulin aspart  0-9 Units Subcutaneous Q4H  . metoprolol tartrate  5 mg Intravenous Q6H   Continuous Infusions: . lactated ringers 125 mL/hr at 03/14/20 1222     LOS: 4 days   Delfino Lovett, MD [facility] Pager On Amion  If 7PM-7AM, please contact night-coverage 03/14/2020, 5:20 PM

## 2020-03-14 NOTE — Progress Notes (Signed)
NG tube pulled per order and conversation with GS PA

## 2020-03-14 NOTE — Progress Notes (Signed)
[facility] SURGICAL PROGRESS NOTE (cpt [phone])  Hospital Day(s): 4.   Interval History:  Patient seen and examined no acute events or new complaints overnight.  Attempted clamping trial of NGT yesterday and patient had about 150 mls residual around 1900, attempted to leave NGT clamped overnight but he developed nausea and this was returned to LIS This morning, patient reports he is feeling a little better and attributes nausea to ambulating yesterday.  No abdominal pain, fever, chills, emesis No new labs this morning He is passing some flatus but again no BMs Total NGT output yesterday was 800 ccs  Review of Systems:  Constitutional: denies fever, chills  HEENT: denies cough or congestion  Respiratory: denies any shortness of breath  Cardiovascular: denies chest pain or palpitations  Gastrointestinal: + nausea (improved this AM), denies abdominal pain, vomiting ,or diarrhea/and bowel function as per interval history Genitourinary: denies burning with urination or urinary frequency   Vital signs in last 24 hours: [min-max] current  Temp:  [97.6 F (36.4 C)-98.6 F (37 C)] 97.6 F (36.4 C) (12/01 0000) Pulse Rate:  [76-81] 78 (12/01 0400) Resp:  [16-18] 18 (11/30 1517) BP: (129-165)/(91-102) 129/91 (12/01 0400) SpO2:  [97 %-98 %] 97 % (12/01 0400)     Height: 5\' 7"  (170.2 cm) Weight: 72.6 kg BMI (Calculated): 25.05   Intake/Output last 2 shifts:  11/30 0701 - 12/01 0700 In: 3028.1 [I.V.:3028.1] Out: 1475 [Urine:625; Emesis/NG output:850]   Physical Exam:  Constitutional: alert, cooperative and no distress  HENT: normocephalic without obvious abnormality, NGT in place Eyes: PERRL, EOM's grossly intact and symmetric  Respiratory: breathing non-labored at rest  Cardiovascular: regular rate and sinus rhythm  Gastrointestinal:Soft,abdominal tenderness resolved, he is mildly distended but much improved, no rebound/guarding Musculoskeletal: UE and LE FROM, no  edema or wounds, motor and sensation grossly intact, NT    Labs:  CBC Latest Ref Rng & Units 03/12/2020 03/11/2020 03/10/2020  WBC 4.0 - 10.5 K/uL 8.8 10.7(H) 15.1(H)  Hemoglobin 13.0 - 17.0 g/dL 03/12/2020 86.7 67.2  Hematocrit 39 - 52 % 43.6 42.2 47.9  Platelets 150 - 400 K/uL 194 200 255   CMP Latest Ref Rng & Units 03/13/2020 03/12/2020 03/11/2020  Glucose 70 - 99 mg/dL 03/13/2020) 709(G) 283(M)  BUN 6 - 20 mg/dL 13 11 13   Creatinine 0.61 - 1.24 mg/dL 629(U 7.65  Sodium 135 - 145 mmol/L 137 138 134(L)  Potassium 3.5 - 5.1 mmol/L 3.9 4.4 3.7  Chloride 98 - 111 mmol/L 100 100 98  CO2 22 - 32 mmol/L 26 29 28   Calcium 8.9 - 10.3 mg/dL 4.65) 8.9 0.35)  Total Protein 6.5 - 8.1 g/dL 6.5 - -  Total Bilirubin 0.3 - 1.2 mg/dL 1.2 - -  Alkaline Phos 38 - 126 U/L 50 - -  AST 15 - 41 U/L 19 - -  ALT 0 - 44 U/L 25 - -    Imaging studies:   KUB (03/13/2020) personally reviewed showing resolution in small bowel dilation and contrast throughout colon, and radiologist report reviewed:  IMPRESSION: NG tube coiled in the upper most portion of the stomach. Interim resolution of small-bowel distention.    Assessment/Plan: (ICD-10's: K39.609) 59 y.o. male with evidence of clinically improving SBO most likely secondary to post-surgical adhesive disease.   - I will repeat clamping trial this morning. We will check residuals at 1130 - 1200. If residuals are less than 150 ccs we can remove NGT. If residuals are >150 ccs I will go  ahead with gastrografin challenge today.   - Continue IVF resuscitation - Monitor renal function, electrolytes while NPO              - Monitor abdominal examination; on-going bowel function - Pain control prn (minimize narcotics if feasible); antiemetics prn  - No need for surgical intervention emergently - Mobilization encouraged  - further management per primary service; we will follow    All of the above findings and  recommendations were discussed with the patient, and the medical team, and all of patient's questions were answered to  expressed satisfaction.   -- Lynden Oxford, PA-C [facility] 03/14/2020, 7:08 AM [phone] M-F: 7am - 4pm

## 2020-03-15 LAB — CBC
HCT: 39.7 % (ref 39.0–52.0)
Hemoglobin: 14 g/dL (ref 13.0–17.0)
MCH: 31.2 pg (ref 26.0–34.0)
MCHC: 35.3 g/dL (ref 30.0–36.0)
MCV: 88.4 fL (ref 80.0–100.0)
Platelets: 222 10*3/uL (ref 150–400)
RBC: 4.49 MIL/uL (ref 4.22–5.81)
RDW: 11.9 % (ref 11.5–15.5)
WBC: 8 10*3/uL (ref 4.0–10.5)
nRBC: 0 % (ref 0.0–0.2)

## 2020-03-15 LAB — BASIC METABOLIC PANEL
Anion gap: 8 (ref 5–15)
BUN: 10 mg/dL (ref 6–20)
CO2: 25 mmol/L (ref 22–32)
Calcium: 8.9 mg/dL (ref 8.9–10.3)
Chloride: 104 mmol/L (ref 98–111)
Creatinine, Ser: 0.68 mg/dL (ref 0.61–1.24)
GFR, Estimated: >60 mL/min (ref >60)
Glucose, Bld: 109 mg/dL — ABNORMAL HIGH (ref 70–99)
Potassium: 4.1 mmol/L (ref 3.5–5.1)
Sodium: 137 mmol/L (ref 135–145)

## 2020-03-15 LAB — GLUCOSE, CAPILLARY
Glucose-Capillary: 103 mg/dL — ABNORMAL HIGH (ref 70–99)
Glucose-Capillary: 154 mg/dL — ABNORMAL HIGH (ref 70–99)
Glucose-Capillary: 203 mg/dL — ABNORMAL HIGH (ref 70–99)
Glucose-Capillary: 91 mg/dL (ref 70–99)
Glucose-Capillary: 92 mg/dL (ref 70–99)
Glucose-Capillary: 96 mg/dL (ref 70–99)
Glucose-Capillary: 99 mg/dL (ref 70–99)

## 2020-03-15 MED ORDER — ROSUVASTATIN CALCIUM 5 MG PO TABS
5.0000 mg | ORAL_TABLET | Freq: Every day | ORAL | Status: DC
  Administered 2020-03-15 – 2020-03-16 (×2): 5 mg via ORAL
  Filled 2020-03-15 (×2): qty 1

## 2020-03-15 MED ORDER — LISINOPRIL 10 MG PO TABS
10.0000 mg | ORAL_TABLET | Freq: Every day | ORAL | Status: DC
  Administered 2020-03-15 – 2020-03-16 (×2): 10 mg via ORAL
  Filled 2020-03-15 (×2): qty 1

## 2020-03-15 NOTE — Progress Notes (Signed)
PROGRESS NOTE    Scott [name]  SKA:[id number] DOB: [birth year] DOA: 03/10/2020 PCP: Danella Penton, MD    Brief Narrative:  59 y.o. male with medical history significant for DM, HTN, partial small bowel resection for Meckel's diverticulum with recurrent episodes of small bowel obstruction who presents to the emergency room with several hour history of abdominal pain and distention and vomiting. Imaging confirmed evidence of SBO with transition zone at surgical anastomosis and distal ileum  Assessment & Plan:   Principal Problem:   SBO (small bowel obstruction) (HCC) Active Problems:   Controlled diabetes mellitus type II without complication (HCC)   Benign essential hypertension   History of resection of small bowel    SBO (small bowel obstruction) (HCC)   History of resection of small bowel for Meckel's diverticulum   History of recurrent SBO at anastomotic site -Patient with abdominal pain and distention and vomiting typical for prior SBO's -CT abdomen and pelvis showingSmall bowel obstruction with transition zone at a surgical anastomosis in the distal ileum -NG tube pulled out on 12/1 per surgery recommendations. Tolerated CLD and advanced to FLD per surgery    Controlled diabetes mellitus type II without complication (HCC) -Sliding scale insulin coverage -Glucose trends have remained stable    Benign essential hypertension -contine oral metoprolol   DVT prophylaxis: Lovenox subQ Code Status: Full Family Communication: Pt in room, family not at bedside  Status is: Inpatient  Remains inpatient appropriate because:Ongoing diagnostic testing needed not appropriate for outpatient work up, Unsafe d/c plan, IV treatments appropriate due to intensity of illness or inability to take PO and Inpatient level of care appropriate due to severity of illness   Dispo: The patient is from: Home              Anticipated d/c is to: Home              Anticipated d/c date is: 1  day              Patient currently is not medically stable to d/c.  Await diet tolerance and progression in clinical condition   Consultants:   General Surgery  Procedures:     Antimicrobials: Anti-infectives (From admission, onward)   None      Subjective: No complaints, wants to advance diet in a hope to go home in next 1-2 days  Objective: Vitals:   03/15/20 0422 03/15/20 0755 03/15/20 1130 03/15/20 1616  BP: (!) 137/98 (!) 150/93 121/89 131/85  Pulse: 81 79 79 79  Resp: 16 17 18 16   Temp: 97.7 F (36.5 C) 98 F (36.7 C) 98.3 F (36.8 C) 98.6 F (37 C)  TempSrc:    Oral  SpO2: 97% 98% 99% 99%  Weight:      Height:        Intake/Output Summary (Last 24 hours) at 03/15/2020 1650 Last data filed at 03/15/2020 1426 Gross per 24 hour  Intake 1740 ml  Output 1600 ml  Net 140 ml   Filed Weights   03/10/20 0023  Weight: 72.6 kg    Examination: General exam: Awake, laying in bed, in nad Respiratory system: Normal respiratory effort, no wheezing Cardiovascular system: regular rate, s1, s2 Gastrointestinal system: Soft, nondistended, positive BS Central nervous system: CN2-12 grossly intact, strength intact Extremities: Perfused, no clubbing Skin: Normal skin turgor, no notable skin lesions seen Psychiatry: Mood normal // no visual hallucinations    Data Reviewed: I have personally reviewed following labs and imaging studies  CBC: Recent Labs  Lab 03/10/20 0034 03/11/20 0352 03/12/20 0504 03/15/20 0421  WBC 15.1* 10.7* 8.8 8.0  HGB 16.5 14.6 14.8 14.0  HCT 47.9 42.2 43.6 39.7  MCV 89.2 90.0 92.0 88.4  PLT 255 200 194 222   Basic Metabolic Panel: Recent Labs  Lab 03/10/20 0034 03/11/20 0352 03/12/20 0504 03/13/20 0634 03/15/20 0421  NA 134* 134* 138 137 137  K 4.4 3.7 4.4 3.9 4.1  CL 95* 98 100 100 104  CO2 26 28 29 26 25   GLUCOSE 280* 167* 142* 109* 109*  BUN 12 13 11 13 10   CREATININE 0.92 1.01 0.86 0.73 0.68  CALCIUM 9.7 8.6* 8.9 8.8*  8.9  MG  --  2.0 2.2  --   --    GFR: Estimated Creatinine Clearance: 95.2 mL/min (by C-G formula based on SCr of 0.68 mg/dL). Liver Function Tests: Recent Labs  Lab 03/10/20 0034 03/13/20 0634  AST 41 19  ALT 60* 25  ALKPHOS 56 50  BILITOT 1.0 1.2  PROT 7.6 6.5  ALBUMIN 4.3 3.5   Recent Labs  Lab 03/10/20 0034  LIPASE 22   No results for input(s): AMMONIA in the last 168 hours. Coagulation Profile: No results for input(s): INR, PROTIME in the last 168 hours. Cardiac Enzymes: No results for input(s): CKTOTAL, CKMB, CKMBINDEX, TROPONINI in the last 168 hours. BNP (last 3 results) No results for input(s): PROBNP in the last 8760 hours. HbA1C: No results for input(s): HGBA1C in the last 72 hours. CBG: Recent Labs  Lab 03/15/20 0016 03/15/20 0424 03/15/20 0754 03/15/20 1129 03/15/20 1623  GLUCAP 96 99 103* 203* 92   Lipid Profile: No results for input(s): CHOL, HDL, LDLCALC, TRIG, CHOLHDL, LDLDIRECT in the last 72 hours. Thyroid Function Tests: No results for input(s): TSH, T4TOTAL, FREET4, T3FREE, THYROIDAB in the last 72 hours. Anemia Panel: No results for input(s): VITAMINB12, FOLATE, FERRITIN, TIBC, IRON, RETICCTPCT in the last 72 hours. Sepsis Labs: No results for input(s): PROCALCITON, LATICACIDVEN in the last 168 hours.  Recent Results (from the past 240 hour(s))  Resp Panel by RT-PCR (Flu A&B, Covid) Nasopharyngeal Swab     Status: None   Collection Time: 03/10/20  3:29 AM   Specimen: Nasopharyngeal Swab; Nasopharyngeal(NP) swabs in vial transport medium  Result Value Ref Range Status   SARS Coronavirus 2 by RT PCR NEGATIVE NEGATIVE Final    Comment: (NOTE) SARS-CoV-2 target nucleic acids are NOT DETECTED.  The SARS-CoV-2 RNA is generally detectable in upper respiratory specimens during the acute phase of infection. The lowest concentration of SARS-CoV-2 viral copies this assay can detect is 138 copies/mL. A negative result does not preclude  SARS-Cov-2 infection and should not be used as the sole basis for treatment or other patient management decisions. A negative result may occur with  improper specimen collection/handling, submission of specimen other than nasopharyngeal swab, presence of viral mutation(s) within the areas targeted by this assay, and inadequate number of viral copies(<138 copies/mL). A negative result must be combined with clinical observations, patient history, and epidemiological information. The expected result is Negative.  Fact Sheet for Patients:  14/02/21  Fact Sheet for Healthcare Providers:  03/12/20  This test is no t yet approved or cleared by the [employer] FDA and  has been authorized for detection and/or diagnosis of SARS-CoV-2 by FDA under an Emergency Use Authorization (EUA). This EUA will remain  in effect (meaning this test can be used) for the duration of the COVID-19 declaration under  Section 564(b)(1) of the Act, 21 U.S.C.section 360bbb-3(b)(1), unless the authorization is terminated  or revoked sooner.       Influenza A by PCR NEGATIVE NEGATIVE Final   Influenza B by PCR NEGATIVE NEGATIVE Final    Comment: (NOTE) The Xpert Xpress SARS-CoV-2/FLU/RSV plus assay is intended as an aid in the diagnosis of influenza from Nasopharyngeal swab specimens and should not be used as a sole basis for treatment. Nasal washings and aspirates are unacceptable for Xpert Xpress SARS-CoV-2/FLU/RSV testing.  Fact Sheet for Patients: BloggerCourse.com  Fact Sheet for Healthcare Providers: SeriousBroker.it  This test is not yet approved or cleared by the [place] FDA and has been authorized for detection and/or diagnosis of SARS-CoV-2 by FDA under an Emergency Use Authorization (EUA). This EUA will remain in effect (meaning this test can be used) for the duration of  the COVID-19 declaration under Section 564(b)(1) of the Act, 21 U.S.C. section 360bbb-3(b)(1), unless the authorization is terminated or revoked.  Performed at [facility], [address], [place], [place] [address]      Radiology Studies: No results found.  Scheduled Meds: . enoxaparin (LOVENOX) injection  40 mg Subcutaneous Q24H  . insulin aspart  0-9 Units Subcutaneous Q4H  . lisinopril  10 mg Oral Daily  . metoprolol tartrate  25 mg Oral BID  . rosuvastatin  5 mg Oral Daily   Continuous Infusions: . lactated ringers 50 mL/hr at 03/15/20 0848     LOS: 5 days   Delfino Lovett, MD [facility] Pager On Amion  If 7PM-7AM, please contact night-coverage 03/15/2020, 4:50 PM

## 2020-03-15 NOTE — Progress Notes (Signed)
Pt ambulated in hall wilthout difficulty this shift

## 2020-03-15 NOTE — Progress Notes (Signed)
[facility] SURGICAL PROGRESS NOTE (cpt [phone])  Hospital Day(s): 5.   Interval History: Patient seen and examined, no acute events or new complaints overnight. Patient reports he continues to do well this morning. He denied any abdominal pain, distension, nausea, emesis, fever, chills. Labs are reassuring this morning. He has tolerated CLD without issues. He is passing flatus but no BM.   Review of Systems:  Constitutional: denies fever, chills  HEENT: denies cough or congestion  Respiratory: denies any shortness of breath  Cardiovascular: denies chest pain or palpitations  Gastrointestinal: denies abdominal pain, N/V, or diarrhea/and bowel function as per interval history Genitourinary: denies burning with urination or urinary frequency  Vital signs in last 24 hours: [min-max] current  Temp:  [97.5 F (36.4 C)-98.4 F (36.9 C)] 98 F (36.7 C) (12/02 0755) Pulse Rate:  [77-94] 79 (12/02 0755) Resp:  [15-18] 17 (12/02 0755) BP: (128-151)/(78-99) 150/93 (12/02 0755) SpO2:  [97 %-98 %] 98 % (12/02 0755)     Height: 5\' 7"  (170.2 cm) Weight: 72.6 kg BMI (Calculated): 25.05   Intake/Output last 2 shifts:  12/01 0701 - 12/02 0700 In: 1718.7 [P.O.:720; I.V.:998.7] Out: 2550 [Urine:2500; Emesis/NG output:50]   Physical Exam:  Constitutional: alert, cooperative and no distress  HENT: normocephalic without obvious abnormality Eyes: PERRL, EOM's grossly intact and symmetric  Respiratory: breathing non-labored at rest  Cardiovascular: regular rate and sinus rhythm  Gastrointestinal:Soft,abdominal tenderness resolved, non-distended, no rebound/guarding Musculoskeletal: UE and LE FROM, no edema or wounds, motor and sensation grossly intact, NT   Labs:  CBC Latest Ref Rng & Units 03/15/2020 03/12/2020 03/11/2020  WBC 4.0 - 10.5 K/uL 8.0 8.8 10.7(H)  Hemoglobin 13.0 - 17.0 g/dL 03/13/2020 17.0 01.7  Hematocrit 39 - 52 % 39.7 43.6 42.2  Platelets 150 - 400 K/uL 222 194 200    CMP Latest Ref Rng & Units 03/15/2020 03/13/2020 03/12/2020  Glucose 70 - 99 mg/dL 03/14/2020) 496(P) 591(M)  BUN 6 - 20 mg/dL 10 13 11   Creatinine 0.61 - 1.24 mg/dL 384(Y 6.59  Sodium 135 - 145 mmol/L 137 137 138  Potassium 3.5 - 5.1 mmol/L 4.1 3.9 4.4  Chloride 98 - 111 mmol/L 104 100 100  CO2 22 - 32 mmol/L 25 26 29   Calcium 8.9 - 10.3 mg/dL 8.9 9.35) 8.9  Total Protein 6.5 - 8.1 g/dL - 6.5 -  Total Bilirubin 0.3 - 1.2 mg/dL - 1.2 -  Alkaline Phos 38 - 126 U/L - 50 -  AST 15 - 41 U/L - 19 -  ALT 0 - 44 U/L - 25 -     Imaging studies: No new pertinent imaging studies   Assessment/Plan: (ICD-10's: K56.609) 59 y.o.malewith clinically improvedSBO most likely secondary to post-surgical adhesive disease.   - Advance to full liquid diet this morning; If he does well today he can be advanced to soft diet for dinner   - Wean from IVF resuscitation   - Monitor abdominal examination; on-going bowel function - Pain control prn (minimize narcotics if feasible); antiemetics prn             - No need for surgical intervention emergently - Mobilization encouraged  - further management per primary service    - Discharge Planning: Anticipate ready for DC in next 24 to 48 hours pending advancement of diet   All of the above findings and recommendations were discussed with the patient, and the medical team, and all of patient's questions were answered to his expressed satisfaction.  --  Lynden Oxford, PA-C [facility] 03/15/2020, 8:55 AM [phone] M-F: 7am - 4pm

## 2020-03-16 LAB — GLUCOSE, CAPILLARY
Glucose-Capillary: 118 mg/dL — ABNORMAL HIGH (ref 70–99)
Glucose-Capillary: 116 mg/dL — ABNORMAL HIGH (ref 70–99)

## 2020-03-16 NOTE — Plan of Care (Signed)
  Problem: Education: Goal: Knowledge of General Education information will improve Description: Including pain rating scale, medication(s)/side effects and non-pharmacologic comfort measures Outcome: Adequate for Discharge   

## 2020-03-16 NOTE — Progress Notes (Addendum)
[facility] SURGICAL PROGRESS NOTE (cpt [phone])  Hospital Day(s): 6.   Interval History: Patient seen and examined, no acute events or new complaints overnight. Patient reports he is doing well and has no complaints. He denied fever, chills, nausea, emesis, abdominal pain. No new labs or imaging. He has tolerated advancement of his diet to soft diet. Continues to endorse flatus and having recorded BMs. He is mobilizing without difficulty  Review of Systems:  Constitutional: denies fever, chills  HEENT: denies cough or congestion  Respiratory: denies any shortness of breath  Cardiovascular: denies chest pain or palpitations  Gastrointestinal: denies abdominal pain, N/V, or diarrhea/and bowel function as per interval history Genitourinary: denies burning with urination or urinary frequency  Vital signs in last 24 hours: [min-max] current  Temp:  [98 F (36.7 C)-98.6 F (37 C)] 98 F (36.7 C) (12/03 0351) Pulse Rate:  [74-79] 74 (12/03 0351) Resp:  [16-18] 17 (12/03 0351) BP: (105-150)/(71-99) 105/71 (12/03 0351) SpO2:  [96 %-99 %] 96 % (12/03 0351)     Height: 5\' 7"  (170.2 cm) Weight: 72.6 kg BMI (Calculated): 25.05   Intake/Output last 2 shifts:  12/02 0701 - 12/03 0700 In: 1020 [P.O.:1020] Out: 1875 [Urine:1875]   Physical Exam:  Constitutional: alert, cooperative and no distress  HENT: normocephalic without obvious abnormality Eyes: PERRL, EOM's grossly intact and symmetric  Respiratory: breathing non-labored at rest  Cardiovascular: regular rate and sinus rhythm  Gastrointestinal: Soft, abdominal tenderness resolved, non-distended, no rebound/guarding Musculoskeletal: UE and LE FROM, no edema or wounds, motor and sensation grossly intact, NT    Labs:  CBC Latest Ref Rng & Units 03/15/2020 03/12/2020 03/11/2020  WBC 4.0 - 10.5 K/uL 8.0 8.8 10.7(H)  Hemoglobin 13.0 - 17.0 g/dL 03/13/2020 85.4 62.7  Hematocrit 39 - 52 % 39.7 43.6 42.2  Platelets 150 - 400 K/uL 222  194 200   CMP Latest Ref Rng & Units 03/15/2020 03/13/2020 03/12/2020  Glucose 70 - 99 mg/dL 03/14/2020) 009(F) 818(E)  BUN 6 - 20 mg/dL 10 13 11   Creatinine 0.61 - 1.24 mg/dL 993(Z 1.69  Sodium 135 - 145 mmol/L 137 137 138  Potassium 3.5 - 5.1 mmol/L 4.1 3.9 4.4  Chloride 98 - 111 mmol/L 104 100 100  CO2 22 - 32 mmol/L 25 26 29   Calcium 8.9 - 10.3 mg/dL 8.9 6.78) 8.9  Total Protein 6.5 - 8.1 g/dL - 6.5 -  Total Bilirubin 0.3 - 1.2 mg/dL - 1.2 -  Alkaline Phos 38 - 126 U/L - 50 -  AST 15 - 41 U/L - 19 -  ALT 0 - 44 U/L - 25 -    Imaging studies: No new pertinent imaging studies   Assessment/Plan: (ICD-10's: K43.609) 59 y.o. male with clinically resolved SBO most likely secondary to post-surgical adhesive disease.   - Okay to continue soft/regular diet   - Monitor abdominal examination; on-going bowel function             - Pain control prn (minimize narcotics if feasible); antiemetics prn              - No need for surgical intervention emergently             - Mobilization encouraged              - further management per primary service               - Discharge Planning: Okay for discharge from surgical standpoint   All  of the above findings and recommendations were discussed with the patient, and the medical team, and all of patient's questions were answered to his expressed satisfaction.   -- Lynden Oxford, PA-C [facility] 03/16/2020, 7:38 AM [phone] M-F: 7am - 4pm  The patient was d/c'd prior to my evaluation. I agree with Mr. Jan Fireman documentation.

## 2020-03-16 NOTE — Discharge Instructions (Signed)
Bowel Obstruction °A bowel obstruction means that something is blocking the small or large bowel. The bowel is also called the intestine. It is the long tube that connects the stomach to the opening of the butt (anus). When something blocks the bowel, food and fluids cannot pass through like normal. This condition needs to be treated. Treatment depends on the cause of the problem and how bad the problem is. °What are the causes? °Common causes of this condition include: °· Scar tissue (adhesions) from past surgery or from high-energy X-rays (radiation). °· Recent surgery in the belly. This affects how food moves in the bowel. °· Some diseases, such as: °? Irritation of the lining of the digestive tract (Crohn's disease). °? Irritation of small pouches in the bowel (diverticulitis). °· Growths or tumors. °· A bulging organ (hernia). °· Twisting of the bowel (volvulus). °· A foreign body. °· Slipping of a part of the bowel into another part (intussusception). °What are the signs or symptoms? °Symptoms of this condition include: °· Pain in the belly. °· Feeling sick to your stomach (nauseous). °· Throwing up (vomiting). °· Bloating in the belly. °· Being unable to pass gas. °· Trouble pooping (constipation). °· Watery poop (diarrhea). °· A lot of belching. °How is this diagnosed? °This condition may be diagnosed based on: °· A physical exam. °· Medical history. °· Imaging tests, such as X-ray or CT scan. °· Blood tests. °· Urine tests. °How is this treated? °Treatment for this condition may include: °· Fluids and pain medicines that are given through an IV tube. Your doctor may tell you not to eat or drink if you feel sick to your stomach and are throwing up. °· Eating a clear liquid diet for a few days. °· Putting a small tube (nasogastric tube) into the stomach. This will help with pain, discomfort, and nausea by removing blocked air and fluids from the stomach. °· Surgery. This may be needed if other treatments do  not work. °Follow these instructions at home: °Medicines °· Take over-the-counter and prescription medicines only as told by your doctor. °· If you were prescribed an antibiotic medicine, take it as told by your doctor. Do not stop taking the antibiotic even if you start to feel better. °General instructions °· Follow your diet as told by your doctor. You may need to: °? Only drink clear liquids until you start to get better. °? Avoid solid foods. °· Return to your normal activities as told by your doctor. Ask your doctor what activities are safe for you. °· Do not sit for a long time without moving. Get up to take short walks every 1-2 hours. This is important. Ask for help if you feel weak or unsteady. °· Keep all follow-up visits as told by your doctor. This is important. °How is this prevented? °After having a bowel obstruction, you may be more likely to have another. You can do some things to stop it from happening again. °· If you have a long-term (chronic) disease, contact your doctor if you see changes or problems. °· Take steps to prevent or treat trouble pooping. Your doctor may ask that you: °? Drink enough fluid to keep your pee (urine) pale yellow. °? Take over-the-counter or prescription medicines. °? Eat foods that are high in fiber. These include beans, whole grains, and fresh fruits and vegetables. °? Limit foods that are high in fat and sugar. These include fried or sweet foods. °· Stay active. Ask your doctor which exercises are   safe for you. °· Avoid stress. °· Eat three small meals and three small snacks each day. °· Work with a food expert (dietitian) to make a meal plan that works for you. °· Do not use any products that contain nicotine or tobacco, such as cigarettes and e-cigarettes. If you need help quitting, ask your doctor. °Contact a doctor if: °· You have a fever. °· You have chills. °Get help right away if: °· You have pain or cramps that get worse. °· You throw up blood. °· You are  sick to your stomach. °· You cannot stop throwing up. °· You cannot drink fluids. °· You feel mixed up (confused). °· You feel very thirsty (dehydrated). °· Your belly gets more bloated. °· You feel weak or you pass out (faint). °Summary °· A bowel obstruction means that something is blocking the small or large bowel. °· Treatment may include IV fluids and pain medicine. You may also have a clear liquid diet, a small tube in your stomach, or surgery. °· Drink clear liquids and avoid solid foods until you get better. °This information is not intended to replace advice given to you by your health care provider. Make sure you discuss any questions you have with your health care provider. °Document Revised: 08/12/2017 Document Reviewed: 08/12/2017 °Elsevier Patient Education © 2020 Elsevier Inc. ° °

## 2020-03-19 NOTE — Discharge Summary (Signed)
[place] at [facility]   PATIENT NAME: Scott [name]    MR#:  [id number]  DATE OF BIRTH:  01-21-1961  DATE OF ADMISSION:  03/10/2020   ADMITTING PHYSICIAN: Andris Baumann, MD  DATE OF DISCHARGE: 03/16/2020 12:21 PM  PRIMARY CARE PHYSICIAN: Danella Penton, MD   ADMISSION DIAGNOSIS:  SBO (small bowel obstruction) (HCC) [K56.609] DISCHARGE DIAGNOSIS:  Principal Problem:   SBO (small bowel obstruction) (HCC) Active Problems:   Controlled diabetes mellitus type II without complication (HCC)   Small bowel obstruction (HCC)   Benign essential hypertension   History of resection of small bowel  SECONDARY DIAGNOSIS:   Past Medical History:  Diagnosis Date  . Allergy   . Diabetes mellitus without complication (HCC)    pt. stated "I don't have diabetes", says last time he was in hospital the dx changed  . History of kidney stones    H/O STONES  . Hypertension    HOSPITAL COURSE:  59 y.o.malewith medical history significant forDM, HTN, partial small bowel resection for Meckel's diverticulum with recurrent episodes of small bowel obstruction admitted with several hour history of abdominal pain and distention and vomiting. Imaging confirmed evidence of SBO with transition zone at surgical anastomosis and distal ileum  SBO (small bowel obstruction) (HCC) History of resection of small bowelforMeckel's diverticulum History of recurrent SBO at anastomotic site -CT abdomen and pelvis showingSmall bowel obstruction with transition zone at a surgical anastomosis in the distal ileum -NG tube pulled out on 12/1 per surgery recommendations. Tolerated diet. Felt back to normal without any symptoms at D/C.  Controlled diabetes mellitus type II without complication (HCC) Benign essential hypertension   DISCHARGE CONDITIONS:  stable CONSULTS OBTAINED:  Treatment Team:  Henrene Dodge, MD DRUG ALLERGIES:  No Known Allergies DISCHARGE MEDICATIONS:   Allergies  as of 03/16/2020   No Known Allergies     Medication List    STOP taking these medications   BC HEADACHE POWDER PO     TAKE these medications   Aspirin Low Dose 81 MG EC tablet Generic drug: aspirin TAKE 1 TABLET BY MOUTH DAILY   lisinopril 10 MG tablet Commonly known as: ZESTRIL Take 1 tablet (10 mg total) by mouth daily.   metFORMIN 500 MG tablet Commonly known as: GLUCOPHAGE Take 1 tablet (500 mg total) by mouth 2 (two) times daily with a meal.   rosuvastatin 5 MG tablet Commonly known as: CRESTOR Take 5 mg by mouth daily.   sildenafil 20 MG tablet Commonly known as: REVATIO TAKE 1-3 TABLETS (20-60 MG TOTAL) BY MOUTH AS NEEDED (TO OBTAIN AN ERECTION).      DISCHARGE INSTRUCTIONS:   DIET:  Regular diet DISCHARGE CONDITION:  Good ACTIVITY:  Activity as tolerated OXYGEN:  Home Oxygen: No.  Oxygen Delivery: room air DISCHARGE LOCATION:  home   If you experience worsening of your admission symptoms, develop shortness of breath, life threatening emergency, suicidal or homicidal thoughts you must seek medical attention immediately by calling 911 or calling your MD immediately  if symptoms less severe.  You Must read complete instructions/literature along with all the possible adverse reactions/side effects for all the Medicines you take and that have been prescribed to you. Take any new Medicines after you have completely understood and accpet all the possible adverse reactions/side effects.   Please note  You were cared for by a hospitalist during your hospital stay. If you have any questions about your discharge medications or the care you received while you  were in the hospital after you are discharged, you can call the unit and asked to speak with the hospitalist on call if the hospitalist that took care of you is not available. Once you are discharged, your primary care physician will handle any further medical issues. Please note that NO REFILLS for any discharge  medications will be authorized once you are discharged, as it is imperative that you return to your primary care physician (or establish a relationship with a primary care physician if you do not have one) for your aftercare needs so that they can reassess your need for medications and monitor your lab values.    On the day of Discharge:  VITAL SIGNS:  Blood pressure (!) 118/92, pulse 72, temperature 98.2 F (36.8 C), temperature source Oral, resp. rate 16, height 5\' 7"  (1.702 m), weight 72.6 kg, SpO2 99 %. PHYSICAL EXAMINATION:  GENERAL:  59 y.o.-year-old patient lying in the bed with no acute distress.  EYES: Pupils equal, round, reactive to light and accommodation. No scleral icterus. Extraocular muscles intact.  HEENT: Head atraumatic, normocephalic. Oropharynx and nasopharynx clear.  NECK:  Supple, no jugular venous distention. No thyroid enlargement, no tenderness.  LUNGS: Normal breath sounds bilaterally, no wheezing, rales,rhonchi or crepitation. No use of accessory muscles of respiration.  CARDIOVASCULAR: S1, S2 normal. No murmurs, rubs, or gallops.  ABDOMEN: Soft, non-tender, non-distended. Bowel sounds present. No organomegaly or mass.  EXTREMITIES: No pedal edema, cyanosis, or clubbing.  NEUROLOGIC: Cranial nerves II through XII are intact. Muscle strength 5/5 in all extremities. Sensation intact. Gait not checked.  PSYCHIATRIC: The patient is alert and oriented x 3.  SKIN: No obvious rash, lesion, or ulcer.  DATA REVIEW:   CBC Recent Labs  Lab 03/15/20 0421  WBC 8.0  HGB 14.0  HCT 39.7  PLT 222    Chemistries  Recent Labs  Lab 03/13/20 0634 03/13/20 0634 03/15/20 0421  NA 137   < > 137  K 3.9   < > 4.1  CL 100   < > 104  CO2 26   < > 25  GLUCOSE 109*   < > 109*  BUN 13   < > 10  CREATININE 0.73   < > 0.68  CALCIUM 8.8*   < > 8.9  AST 19  --   --   ALT 25  --   --   ALKPHOS 50  --   --   BILITOT 1.2  --   --    < > = values in this interval not displayed.      Outpatient follow-up  Follow-up Information    14/02/21, MD. Schedule an appointment as soon as possible for a visit in 1 week(s).   Specialty: Internal Medicine Contact information: [phone] [facility] [address] [facility] [place] [phone]        [phone], PA-C. Schedule an appointment as soon as possible for a visit in 2 week(s).   Specialty: Physician Assistant Contact information: [address] [place] [phone]                Management plans discussed with the patient, family and they are in agreement.  CODE STATUS: Prior   TOTAL TIME TAKING CARE OF THIS PATIENT: 45 minutes.    [phone] M.D on 03/19/2020 at 3:53 PM  [facility]   CC: Primary care physician; 14/09/2019, MD   Note: This dictation was prepared  with Dragon dictation along with smaller phrase technology. Any transcriptional errors that result from this process are unintentional.

## 2020-03-22 DIAGNOSIS — K56609 Unspecified intestinal obstruction, unspecified as to partial versus complete obstruction: Secondary | ICD-10-CM | POA: Diagnosis not present

## 2020-04-02 ENCOUNTER — Ambulatory Visit (INDEPENDENT_AMBULATORY_CARE_PROVIDER_SITE_OTHER): Payer: BC Managed Care – PPO | Admitting: Physician Assistant

## 2020-04-02 ENCOUNTER — Other Ambulatory Visit: Payer: Self-pay

## 2020-04-02 ENCOUNTER — Encounter: Payer: Self-pay | Admitting: Physician Assistant

## 2020-04-02 VITALS — BP 150/95 | HR 87 | Temp 97.7°F | Ht 67.0 in | Wt 153.8 lb

## 2020-04-02 DIAGNOSIS — K56609 Unspecified intestinal obstruction, unspecified as to partial versus complete obstruction: Secondary | ICD-10-CM

## 2020-04-02 NOTE — Patient Instructions (Signed)
Try getting up slowly to avoid dizziness. If you have any concerns or questions, please feel free to give [place] a call.     Bowel Obstruction A bowel obstruction means that something is blocking the small or large bowel. The bowel is also called the intestine. It is the long tube that connects the stomach to the opening of the butt (anus). When something blocks the bowel, food and fluids cannot pass through like normal. This condition needs to be treated. Treatment depends on the cause of the problem and how bad the problem is. What are the causes? Common causes of this condition include:  Scar tissue (adhesions) from past surgery or from high-energy X-rays (radiation).  Recent surgery in the belly. This affects how food moves in the bowel.  Some diseases, such as: ? Irritation of the lining of the digestive tract (Crohn's disease). ? Irritation of small pouches in the bowel (diverticulitis).  Growths or tumors.  A bulging organ (hernia).  Twisting of the bowel (volvulus).  A foreign body.  Slipping of a part of the bowel into another part (intussusception). What are the signs or symptoms? Symptoms of this condition include:  Pain in the belly.  Feeling sick to your stomach (nauseous).  Throwing up (vomiting).  Bloating in the belly.  Being unable to pass gas.  Trouble pooping (constipation).  Watery poop (diarrhea).  A lot of belching. How is this diagnosed? This condition may be diagnosed based on:  A physical exam.  Medical history.  Imaging tests, such as X-ray or CT scan.  Blood tests.  Urine tests. How is this treated? Treatment for this condition may include:  Fluids and pain medicines that are given through an IV tube. Your doctor may tell you not to eat or drink if you feel sick to your stomach and are throwing up.  Eating a clear liquid diet for a few days.  Putting a small tube (nasogastric tube) into the stomach. This will help with pain,  discomfort, and nausea by removing blocked air and fluids from the stomach.  Surgery. This may be needed if other treatments do not work. Follow these instructions at home: Medicines  Take over-the-counter and prescription medicines only as told by your doctor.  If you were prescribed an antibiotic medicine, take it as told by your doctor. Do not stop taking the antibiotic even if you start to feel better. General instructions  Follow your diet as told by your doctor. You may need to: ? Only drink clear liquids until you start to get better. ? Avoid solid foods.  Return to your normal activities as told by your doctor. Ask your doctor what activities are safe for you.  Do not sit for a long time without moving. Get up to take short walks every 1-2 hours. This is important. Ask for help if you feel weak or unsteady.  Keep all follow-up visits as told by your doctor. This is important. How is this prevented? After having a bowel obstruction, you may be more likely to have another. You can do some things to stop it from happening again.  If you have a long-term (chronic) disease, contact your doctor if you see changes or problems.  Take steps to prevent or treat trouble pooping. Your doctor may ask that you: ? Drink enough fluid to keep your pee (urine) pale yellow. ? Take over-the-counter or prescription medicines. ? Eat foods that are high in fiber. These include beans, whole grains, and fresh fruits and  vegetables. ? Limit foods that are high in fat and sugar. These include fried or sweet foods.  Stay active. Ask your doctor which exercises are safe for you.  Avoid stress.  Eat three small meals and three small snacks each day.  Work with a Psychologist, prison and probation services (dietitian) to make a meal plan that works for you.  Do not use any products that contain nicotine or tobacco, such as cigarettes and e-cigarettes. If you need help quitting, ask your doctor. Contact a doctor if:  You have a  fever.  You have chills. Get help right away if:  You have pain or cramps that get worse.  You throw up blood.  You are sick to your stomach.  You cannot stop throwing up.  You cannot drink fluids.  You feel mixed up (confused).  You feel very thirsty (dehydrated).  Your belly gets more bloated.  You feel weak or you pass out (faint). Summary  A bowel obstruction means that something is blocking the small or large bowel.  Treatment may include IV fluids and pain medicine. You may also have a clear liquid diet, a small tube in your stomach, or surgery.  Drink clear liquids and avoid solid foods until you get better. This information is not intended to replace advice given to you by your health care provider. Make sure you discuss any questions you have with your health care provider. Document Revised: 08/12/2017 Document Reviewed: 08/12/2017 Elsevier Patient Education  2020 [employer].

## 2020-04-02 NOTE — Progress Notes (Signed)
[facility]IC NEW PATIENT  Referring provider:  Danella Penton, MD [address] [facility] [place],  [place] [address]  HISTORY OF PRESENT ILLNESS (HPI):  59 y.o. male presents for follow up follow admission from 11/27 - 12/03 for small bowel obstruction presumably from post-surgical adhesive disease given his previous abdominal surgeries. This was managed conservatively with NGT decompression. Since discharge, he reports that he has been doing much better. He denied any abdominal pain, nausea, emesis, fever, chills, CP, SOB. He is tolerating PO, although, he thinks he may have "over did it" yesterday. He continues to pass flatus regularly and has been having normal bowel movements. This is his first SBO in the last 5-6 years. No other complaints this morning.     PAST MEDICAL HISTORY (PMH):  Past Medical History:  Diagnosis Date  . Allergy   . Diabetes mellitus without complication (HCC)    pt. stated "I don't have diabetes", says last time he was in hospital the dx changed  . History of kidney stones    H/O STONES  . Hypertension      PAST SURGICAL HISTORY (PSH):  Past Surgical History:  Procedure Laterality Date  . APPENDECTOMY    . CHOLECYSTECTOMY    . COLONOSCOPY WITH PROPOFOL N/A 06/24/2017   Procedure: COLONOSCOPY WITH PROPOFOL;  Surgeon: Earline Mayotte, MD;  Location: [facility] ENDOSCOPY;  Service: Endoscopy;  Laterality: N/A;  . LAPAROTOMY N/A 02/05/2016   Procedure: EXPLORATORY LAPAROTOMY FOR SMALL BOWEL OBSTRUCTION;  Surgeon: Kieth Brightly, MD;  Location: [facility] ORS;  Service: General;  Laterality: N/A;  . LYSIS OF ADHESION  02/05/2016   Procedure: LYSIS OF ADHESION;  Surgeon: Kieth Brightly, MD;  Location: [facility] ORS;  Service: General;;  . SMALL INTESTINE SURGERY  2005   Excision of Meckel's diverticulum  . VEIN REPAIR Left 07/28/2018   Procedure: RADIAL ARTERY LIGATION LEFT;  Surgeon: Annice Needy,  MD;  Location: [facility] ORS;  Service: Vascular;  Laterality: Left;     MEDICATIONS:  Prior to Admission medications   Medication Sig Start Date End Date Taking? Authorizing Provider  ASPIRIN LOW DOSE 81 MG EC tablet TAKE 1 TABLET BY MOUTH DAILY 09/01/19   Georgiana Spinner, NP  lisinopril (ZESTRIL) 10 MG tablet Take 1 tablet (10 mg total) by mouth daily. 01/27/19   Galen Manila, NP  metFORMIN (GLUCOPHAGE) 500 MG tablet Take 1 tablet (500 mg total) by mouth 2 (two) times daily with a meal. 01/27/19   Galen Manila, NP  rosuvastatin (CRESTOR) 5 MG tablet Take 5 mg by mouth daily. 03/02/20   [provider]  sildenafil (REVATIO) 20 MG tablet TAKE 1-3 TABLETS (20-60 MG TOTAL) BY MOUTH AS NEEDED (TO OBTAIN AN ERECTION). 05/06/19   Karamalegos, Netta Neat, DO     ALLERGIES:  No Known Allergies   SOCIAL HISTORY:  Social History   Socioeconomic History  . Marital status: Married    Spouse name: Not on file  . Number of children: Not on file  . Years of education: Not on file  . Highest education level: Not on file  Occupational History  . Not on file  Tobacco Use  . Smoking status: Never Smoker  . Smokeless tobacco: Never Used  Vaping Use  . Vaping Use: Never used  Substance and Sexual Activity  . Alcohol use: Not Currently    Alcohol/week: 0.0 standard drinks    Comment: occasional  . Drug use: No  .  Sexual activity: Not on file  Other Topics Concern  . Not on file  Social History Narrative  . Not on file   Social Determinants of Health   Financial Resource Strain: Not on file  Food Insecurity: Not on file  Transportation Needs: Not on file  Physical Activity: Not on file  Stress: Not on file  Social Connections: Not on file  Intimate Partner Violence: Not on file     FAMILY HISTORY:  Family History  Problem Relation Age of Onset  . Heart disease Mother   . Hypertension Mother   . Diabetes Mother   . Diabetes Sister   . Diabetes Brother      Otherwise negative/non-contributory.  REVIEW OF SYSTEMS:  Review of Systems  Constitutional: Negative for chills and fever.  HENT: Negative for sinus pain and sore throat.   Respiratory: Negative for cough and shortness of breath.   Cardiovascular: Negative for chest pain and palpitations.  Gastrointestinal: Negative for abdominal pain, constipation, diarrhea, nausea and vomiting.  Genitourinary: Negative for dysuria and urgency.  All other systems reviewed and are negative.   All other review of systems were otherwise negative   VITAL SIGNS:  @VSRANGES @             PHYSICAL EXAM:  Physical Exam Vitals and nursing note reviewed. Exam conducted with a chaperone present.  Constitutional:      General: He is not in acute distress.    Appearance: Normal appearance. He is obese. He is not ill-appearing.  HENT:     Head: Normocephalic and atraumatic.  Eyes:     General: No scleral icterus.    Conjunctiva/sclera: Conjunctivae normal.     Comments: Left strabismus   Cardiovascular:     Rate and Rhythm: Normal rate and regular rhythm.     Pulses: Normal pulses.     Heart sounds: No murmur heard.   Pulmonary:     Effort: Pulmonary effort is normal. No respiratory distress.     Breath sounds: Normal breath sounds.  Abdominal:     General: Abdomen is protuberant. There is no distension.     Palpations: Abdomen is soft.     Tenderness: There is no abdominal tenderness. There is no guarding or rebound.     Comments: Abdomen is soft, protuberant at baseline, non-tender, non-distended, no rebound/guarding  Genitourinary:    Comments: Deferred Musculoskeletal:     Right lower leg: No edema.     Left lower leg: No edema.  Skin:    General: Skin is warm and dry.     Coloration: Skin is not pale.     Findings: No erythema.  Neurological:     General: No focal deficit present.     Mental Status: He is alert and oriented to person, place, and time.  Psychiatric:        Mood and  Affect: Mood normal.        Behavior: Behavior normal.      Labs: No new pertinent labs  Imaging studies:  No new pertinent imaging studies    Assessment/Plan:  59 y.o. male with resolved SBO presumably secondary to post-surgical adhesive disease given his multiple previous abdominal surgeries.   - Reviewed etiology of his SBO and discussed unpredictability of recurrence. He was understanding of this  - No role for elective intervention at this time given remote episodes (once every 5-6 years). He understands that should these start to recur multiple times a year and interfere with quality of  life, then we could potentially consider elective intervention  - No true dietary restrictions  - Monitor for signs/symptoms of recurrence, including but no limited to, abdominal pain, distension, nausea, emesis, decreased bowel function    - return to clinic as needed, instructed to call if any questions or concerns  All of the above recommendations were discussed with the patient, and all of patient's questions were answered to his expressed satisfaction.  Thank you for the opportunity to participate in this patient's care.  -- Lynden Oxford, PA-C [facility] 04/02/2020, 10:57 AM [phone] M-F: 7am - 4pm

## 2021-07-22 ENCOUNTER — Emergency Department: Payer: BC Managed Care – PPO

## 2021-07-22 ENCOUNTER — Inpatient Hospital Stay: Payer: BC Managed Care – PPO

## 2021-07-22 ENCOUNTER — Inpatient Hospital Stay
Admission: EM | Admit: 2021-07-22 | Discharge: 2021-07-25 | DRG: 390 | Disposition: A | Discharge status: Home / Self Care | Payer: BC Managed Care – PPO | Attending: Surgery | Admitting: Surgery

## 2021-07-22 ENCOUNTER — Other Ambulatory Visit: Payer: Self-pay

## 2021-07-22 ENCOUNTER — Encounter: Payer: Self-pay | Admitting: Emergency Medicine

## 2021-07-22 DIAGNOSIS — E11649 Type 2 diabetes mellitus with hypoglycemia without coma: Secondary | ICD-10-CM | POA: Diagnosis not present

## 2021-07-22 DIAGNOSIS — Z8249 Family history of ischemic heart disease and other diseases of the circulatory system: Secondary | ICD-10-CM | POA: Diagnosis not present

## 2021-07-22 DIAGNOSIS — Z79899 Other long term (current) drug therapy: Secondary | ICD-10-CM

## 2021-07-22 DIAGNOSIS — Z7984 Long term (current) use of oral hypoglycemic drugs: Secondary | ICD-10-CM

## 2021-07-22 DIAGNOSIS — K56609 Unspecified intestinal obstruction, unspecified as to partial versus complete obstruction: Principal | ICD-10-CM | POA: Diagnosis present

## 2021-07-22 DIAGNOSIS — Z833 Family history of diabetes mellitus: Secondary | ICD-10-CM

## 2021-07-22 DIAGNOSIS — Z7982 Long term (current) use of aspirin: Secondary | ICD-10-CM

## 2021-07-22 DIAGNOSIS — Z87442 Personal history of urinary calculi: Secondary | ICD-10-CM | POA: Diagnosis not present

## 2021-07-22 DIAGNOSIS — I10 Essential (primary) hypertension: Secondary | ICD-10-CM | POA: Diagnosis present

## 2021-07-22 LAB — CBG MONITORING, ED
Glucose-Capillary: 138 mg/dL — ABNORMAL HIGH (ref 70–99)
Glucose-Capillary: 100 mg/dL — ABNORMAL HIGH (ref 70–99)

## 2021-07-22 LAB — CBC
HCT: 52.5 % — ABNORMAL HIGH (ref 39.0–52.0)
Hemoglobin: 17.4 g/dL — ABNORMAL HIGH (ref 13.0–17.0)
MCH: 28.9 pg (ref 26.0–34.0)
MCHC: 33.1 g/dL (ref 30.0–36.0)
MCV: 87.1 fL (ref 80.0–100.0)
Platelets: 244 10*3/uL (ref 150–400)
RBC: 6.03 MIL/uL — ABNORMAL HIGH (ref 4.22–5.81)
RDW: 12.5 % (ref 11.5–15.5)
WBC: 8.2 10*3/uL (ref 4.0–10.5)
nRBC: 0 % (ref 0.0–0.2)

## 2021-07-22 LAB — COMPREHENSIVE METABOLIC PANEL
ALT: 76 U/L — ABNORMAL HIGH (ref 0–44)
AST: 54 U/L — ABNORMAL HIGH (ref 15–41)
Albumin: 4.4 g/dL (ref 3.5–5.0)
Alkaline Phosphatase: 48 U/L (ref 38–126)
Anion gap: 9 (ref 5–15)
BUN: 17 mg/dL (ref 6–20)
CO2: 30 mmol/L (ref 22–32)
Calcium: 9 mg/dL (ref 8.9–10.3)
Chloride: 93 mmol/L — ABNORMAL LOW (ref 98–111)
Creatinine, Ser: 0.96 mg/dL (ref 0.61–1.24)
GFR, Estimated: >60 mL/min (ref >60)
Glucose, Bld: 184 mg/dL — ABNORMAL HIGH (ref 70–99)
Potassium: 4.1 mmol/L (ref 3.5–5.1)
Sodium: 132 mmol/L — ABNORMAL LOW (ref 135–145)
Total Bilirubin: 1.5 mg/dL — ABNORMAL HIGH (ref 0.3–1.2)
Total Protein: 8.1 g/dL (ref 6.5–8.1)

## 2021-07-22 LAB — LIPASE, BLOOD: Lipase: 22 U/L (ref 11–51)

## 2021-07-22 MED ORDER — INSULIN ASPART 100 UNIT/ML IJ SOLN
0.0000 [IU] | INTRAMUSCULAR | Status: DC
  Administered 2021-07-24: 1 [IU] via SUBCUTANEOUS
  Administered 2021-07-24: 2 [IU] via SUBCUTANEOUS
  Administered 2021-07-24 – 2021-07-25 (×2): 1 [IU] via SUBCUTANEOUS
  Filled 2021-07-22 (×4): qty 1

## 2021-07-22 MED ORDER — PANTOPRAZOLE SODIUM 40 MG IV SOLR
40.0000 mg | INTRAVENOUS | Status: DC
  Administered 2021-07-22 – 2021-07-24 (×3): 40 mg via INTRAVENOUS
  Filled 2021-07-22 (×3): qty 10

## 2021-07-22 MED ORDER — MORPHINE SULFATE (PF) 2 MG/ML IV SOLN
2.0000 mg | INTRAVENOUS | Status: DC | PRN
  Administered 2021-07-22 – 2021-07-24 (×5): 2 mg via INTRAVENOUS
  Filled 2021-07-22 (×5): qty 1

## 2021-07-22 MED ORDER — MORPHINE SULFATE (PF) 4 MG/ML IV SOLN
4.0000 mg | Freq: Once | INTRAVENOUS | Status: AC
  Administered 2021-07-22: 4 mg via INTRAVENOUS
  Filled 2021-07-22: qty 1

## 2021-07-22 MED ORDER — ONDANSETRON 4 MG PO TBDP: 4.0000 mg | ORAL_TABLET | Freq: Four times a day (QID) | ORAL | Status: DC | PRN

## 2021-07-22 MED ORDER — ENOXAPARIN SODIUM 40 MG/0.4ML IJ SOSY
40.0000 mg | PREFILLED_SYRINGE | INTRAMUSCULAR | Status: DC
  Administered 2021-07-23 – 2021-07-24 (×2): 40 mg via SUBCUTANEOUS
  Filled 2021-07-22 (×2): qty 0.4

## 2021-07-22 MED ORDER — INSULIN ASPART 100 UNIT/ML IJ SOLN: 0.0000 [IU] | Freq: Three times a day (TID) | INTRAMUSCULAR | Status: DC

## 2021-07-22 MED ORDER — SODIUM CHLORIDE 0.9 % IV SOLN: INTRAVENOUS | Status: DC

## 2021-07-22 MED ORDER — HYDRALAZINE HCL 20 MG/ML IJ SOLN: 10.0000 mg | INTRAMUSCULAR | Status: DC | PRN

## 2021-07-22 MED ORDER — IOHEXOL 300 MG/ML  SOLN
100.0000 mL | Freq: Once | INTRAMUSCULAR | Status: AC | PRN
  Administered 2021-07-22: 100 mL via INTRAVENOUS
  Filled 2021-07-22: qty 100

## 2021-07-22 MED ORDER — ONDANSETRON 4 MG PO TBDP
4.0000 mg | ORAL_TABLET | Freq: Once | ORAL | Status: AC | PRN
  Administered 2021-07-22: 4 mg via ORAL
  Filled 2021-07-22: qty 1

## 2021-07-22 MED ORDER — ONDANSETRON HCL 4 MG/2ML IJ SOLN: 4.0000 mg | Freq: Four times a day (QID) | INTRAMUSCULAR | Status: DC | PRN

## 2021-07-22 MED ORDER — ONDANSETRON HCL 4 MG/2ML IJ SOLN
4.0000 mg | Freq: Once | INTRAMUSCULAR | Status: AC
  Administered 2021-07-22: 4 mg via INTRAVENOUS
  Filled 2021-07-22: qty 2

## 2021-07-22 NOTE — ED Provider Notes (Signed)
? ?[facility] ?Provider Note ? ? ? Event Date/Time  ? First MD Initiated Contact with Patient 07/22/21 1249   ?  (approximate) ? ? ?History  ? ?Abdominal Pain and Emesis ? ? ?HPI ? ?Scott [name] is a 61 y.o. male with a history of small bowel obstructions who presents with complaints of abdominal distention and some nausea.  Patient reports he has noted abdominal distention over the last several days he is worried about SBO.  Family also reports that his doctor noted elevated LFTs recently and that he has a history of drinking.  Normal stools. ?  ? ? ?Physical Exam  ? ?Triage Vital Signs: ?ED Triage Vitals  ?Enc Vitals Group  ?   BP 07/22/21 1227 115/89  ?   Pulse Rate 07/22/21 1227 (!) 108  ?   Resp 07/22/21 1227 18  ?   Temp 07/22/21 1227 97.8 ?F (36.6 ?C)  ?   Temp Source 07/22/21 1227 Oral  ?   SpO2 07/22/21 1227 96 %  ?   Weight 07/22/21 1225 66.7 kg (147 lb)  ?   Height 07/22/21 1225 1.702 m (5\' 7" )  ?   Head Circumference --   ?   Peak Flow --   ?   Pain Score 07/22/21 1225 8  ?   Pain Loc --   ?   Pain Edu? --   ?   Excl. in GC? --   ? ? ?Most recent vital signs: ?Vitals:  ? 07/22/21 1227  ?BP: 115/89  ?Pulse: (!) 108  ?Resp: 18  ?Temp: 97.8 ?F (36.6 ?C)  ?SpO2: 96%  ? ? ? ?General: Awake, no distress.  ?CV:  Good peripheral perfusion.  ?Resp:  Normal effort.  ?Abd:  Positive mild distention, no significant tenderness to palpation ?Other:   ? ? ?ED Results / Procedures / Treatments  ? ?Labs ?(all labs ordered are listed, but only abnormal results are displayed) ?Labs Reviewed  ?COMPREHENSIVE METABOLIC PANEL - Abnormal; Notable for the following components:  ?    Result Value  ? Sodium 132 (*)   ? Chloride 93 (*)   ? Glucose, Bld 184 (*)   ? AST 54 (*)   ? ALT 76 (*)   ? Total Bilirubin 1.5 (*)   ? All other components within normal limits  ?CBC - Abnormal; Notable for the following components:  ? RBC 6.03 (*)   ? Hemoglobin 17.4 (*)   ? HCT 52.5 (*)   ? All other components  within normal limits  ?LIPASE, BLOOD  ?URINALYSIS, ROUTINE W REFLEX MICROSCOPIC  ? ? ? ?EKG ? ? ? ? ?RADIOLOGY ?CT abdomen pelvis ? ? ? ?PROCEDURES: ? ?Critical Care performed:  ? ?Procedures ? ? ?MEDICATIONS ORDERED IN ED: ?Medications  ?ondansetron (ZOFRAN-ODT) disintegrating tablet 4 mg (4 mg Oral Given 07/22/21 1229)  ?morphine (PF) 4 MG/ML injection 4 mg (4 mg Intravenous Given 07/22/21 1321)  ?ondansetron (ZOFRAN) injection 4 mg (4 mg Intravenous Given 07/22/21 1318)  ?iohexol (OMNIPAQUE) 300 MG/ML solution 100 mL (100 mLs Intravenous Contrast Given 07/22/21 1342)  ? ? ? ?IMPRESSION / MDM / ASSESSMENT AND PLAN / ED COURSE  ?I reviewed the triage vital signs and the nursing notes. ? ?Patient presents with abdominal distention and nausea as detailed above.  Differential includes SBO, given concerns for LFTs and history of drinking ascites is also on the differential although less likely. ? ?We will obtain labs, give IV morphine, IV Zofran,  obtain CT abdomen pelvis and reevaluate. ? ?Lab work overall reassuring, mild elevation in LFTs. ? ?Contacted by radiologist and notified of small bowel obstruction with transition point in the ileum ? ?I have consulted the surgery team, Dr. Aleen Campi they will see the patient in the ED. ? ? ? ? ? ?  ? ? ?FINAL CLINICAL IMPRESSION(S) / ED DIAGNOSES  ? ?Final diagnoses:  ?Small bowel obstruction (HCC)  ? ? ? ?Rx / DC Orders  ? ?ED Discharge Orders   ? ? None  ? ?  ? ? ? ?Note:  This document was prepared using Dragon voice recognition software and may include unintentional dictation errors. ?  ?Jene Every, MD ?07/22/21 1507 ? ?

## 2021-07-22 NOTE — H&P (Signed)
[facility] ?SURGICAL HISTORY & PHYSICAL (cpt [id number]) ? ?HISTORY OF PRESENT ILLNESS (HPI):  ?61 y.o. male [facility] ED today for abdominal pain and distension. Patient reports around a 3 day history of progressively worsening lower abdominal pain, distension, nausea, and emesis. No fever, chills, CP, SOB, or urinary changes. He did pass flatus yesterday but nothing today. This is very similar to his previous admission in Nov/Dec of 2021 for SBO as well. This was managed conservatively with NGT decompression, and he has done well up until 3 days ago. He reports that he typical had gone up to 5 years in between obstructions prior to this one recurring in 1.5 years. He has a significant history of abdominal surgeries including appendectomy, cholecystectomy, prior small bowel resection for Meckel's diverticulum, prior exploratory laparotomy with lysis of adhesions and small bowel resection for small bowel obstruction. Work up in the ED revealed a normal WBC at 5.2K, Hgb to 17.4 likely indicating hemoconcentration, normal sCr at - 0.96, and no electrolyte derangements. CT Abdomen/Pelvis was obtained and concerning for SBO with transition zone near anastomosis in distal small bowel, which is similar in appearance to 2021.  ? ?General surgery was consulted by emergency medicine physician Dr Jene Everyobert Kinner, MD for evaluation and management of SBO.  ? ? ?PAST MEDICAL HISTORY (PMH):  ?Past Medical History:  ?Diagnosis Date  ? Allergy   ? Diabetes mellitus without complication (HCC)   ? pt. stated "I don't have diabetes", says last time he was in hospital the dx changed  ? History of kidney stones   ? H/O STONES  ? Hypertension   ?  ?Reviewed. Otherwise negative.  ? ?PAST SURGICAL HISTORY (PSH):  ?Past Surgical History:  ?Procedure Laterality Date  ? APPENDECTOMY    ? CHOLECYSTECTOMY    ? COLONOSCOPY WITH PROPOFOL N/A 06/24/2017  ? Procedure: COLONOSCOPY WITH PROPOFOL;  Surgeon: Earline MayotteByrnett, Jeffrey W, MD;   [facility];  Service: Endoscopy;  Laterality: N/A;  ? LAPAROTOMY N/A 02/05/2016  ? Procedure: EXPLORATORY LAPAROTOMY FOR SMALL BOWEL OBSTRUCTION;  Surgeon: Kieth BrightlySeeplaputhur G Sankar, MD;  Location: [facility] ORS;  Service: General;  Laterality: N/A;  ? LYSIS OF ADHESION  02/05/2016  ? Procedure: LYSIS OF ADHESION;  Surgeon: Kieth BrightlySeeplaputhur G Sankar, MD;  Location: [facility] ORS;  Service: General;;  ? SMALL INTESTINE SURGERY  2005  ? Excision of Meckel's diverticulum  ? VEIN REPAIR Left 07/28/2018  ? Procedure: RADIAL ARTERY LIGATION LEFT;  Surgeon: Annice Needyew, Jason S, MD;  Location: [facility] ORS;  Service: Vascular;  Laterality: Left;  ?  ?Reviewed. Otherwise negative.  ? ?MEDICATIONS:  ?Prior to Admission medications   ?Medication Sig Start Date End Date Taking? Authorizing Provider  ?ASPIRIN LOW DOSE 81 MG EC tablet TAKE 1 TABLET BY MOUTH DAILY 09/01/19   Georgiana SpinnerBrown, Fallon E, NP  ?lisinopril (ZESTRIL) 10 MG tablet Take 1 tablet (10 mg total) by mouth daily. 01/27/19   Galen ManilaKennedy, Lauren Renee, NP  ?metFORMIN (GLUCOPHAGE) 500 MG tablet Take 1 tablet (500 mg total) by mouth 2 (two) times daily with a meal. 01/27/19   Galen ManilaKennedy, Lauren Renee, NP  ?naproxen (NAPROSYN) 500 MG tablet  05/24/19   [provider]  ?rosuvastatin (CRESTOR) 5 MG tablet Take 5 mg by mouth daily. 03/02/20   [provider]  ?sildenafil (REVATIO) 20 MG tablet TAKE 1-3 TABLETS (20-60 MG TOTAL) BY MOUTH AS NEEDED (TO OBTAIN AN ERECTION). 05/06/19   Smitty CordsKaramalegos, Alexander J, DO  ?  ? ?ALLERGIES:  ?No Known Allergies  ? ?  SOCIAL HISTORY:  ?Social History  ? ?Socioeconomic History  ? Marital status: Married  ?  Spouse name: Not on file  ? Number of children: Not on file  ? Years of education: Not on file  ? Highest education level: Not on file  ?Occupational History  ? Not on file  ?Tobacco Use  ? Smoking status: Never  ? Smokeless tobacco: Never  ?Vaping Use  ? Vaping Use: Never used  ?Substance and Sexual Activity  ? Alcohol use: Not Currently  ?   Alcohol/week: 0.0 standard drinks  ?  Comment: occasional  ? Drug use: No  ? Sexual activity: Not on file  ?Other Topics Concern  ? Not on file  ?Social History Narrative  ? Not on file  ? ?Social Determinants of Health  ? ?Financial Resource Strain: Not on file  ?Food Insecurity: Not on file  ?Transportation Needs: Not on file  ?Physical Activity: Not on file  ?Stress: Not on file  ?Social Connections: Not on file  ?Intimate Partner Violence: Not on file  ?  ? ?FAMILY HISTORY:  ?Family History  ?Problem Relation Age of Onset  ? Heart disease Mother   ? Hypertension Mother   ? Diabetes Mother   ? Diabetes Sister   ? Diabetes Brother   ?  ?Otherwise negative.  ? ?REVIEW OF SYSTEMS:  ?Review of Systems  ?Constitutional:  Negative for chills and fever.  ?HENT:  Negative for congestion and sore throat.   ?Respiratory:  Negative for cough and shortness of breath.   ?Cardiovascular:  Negative for chest pain and palpitations.  ?Gastrointestinal:  Positive for abdominal pain, nausea and vomiting. Negative for constipation and diarrhea.  ?Genitourinary:  Negative for dysuria and urgency.  ?All other systems reviewed and are negative. ? ?VITAL SIGNS:  ?Temp:  [97.8 ?F (36.6 ?C)] 97.8 ?F (36.6 ?C) (04/10 1227) ?Pulse Rate:  [108] 108 (04/10 1227) ?Resp:  [18] 18 (04/10 1227) ?BP: (115)/(89) 115/89 (04/10 1227) ?SpO2:  [96 %] 96 % (04/10 1227) ?Weight:  [66.7 kg] 66.7 kg (04/10 1225)     Height: 5\' 7"  (170.2 cm) Weight: 66.7 kg BMI (Calculated): 23.02  ? ?PHYSICAL EXAM:  ?Physical Exam ?Vitals and nursing note reviewed.  ?Constitutional:   ?   General: He is not in acute distress. ?   Appearance: He is well-developed. He is not ill-appearing.  ?HENT:  ?   Head: Normocephalic and atraumatic.  ?Eyes:  ?   General: No scleral icterus. ?   Extraocular Movements: Extraocular movements intact.  ?Cardiovascular:  ?   Rate and Rhythm: Normal rate and regular rhythm.  ?   Heart sounds: Normal heart sounds. No murmur heard. ?Pulmonary:   ?   Effort: Pulmonary effort is normal. No respiratory distress.  ?   Breath sounds: Normal breath sounds.  ?Abdominal:  ?   General: A surgical scar is present. There is distension.  ?   Palpations: Abdomen is soft.  ?   Tenderness: There is abdominal tenderness in the suprapubic area. There is no guarding or rebound.  ?   Comments: Abdomen is markedly distended and tympanic, mild lower abdominal tenderness, no rebound/guarding. He is certainly not peritonitic    ?Genitourinary: ?   Comments: Deferred ?Skin: ?   General: Skin is warm and dry.  ?   Coloration: Skin is not jaundiced.  ?   Findings: No erythema.  ?Neurological:  ?   General: No focal deficit present.  ?   Mental Status: He  is alert and oriented to person, place, and time.  ?Psychiatric:     ?   Mood and Affect: Mood normal.     ?   Behavior: Behavior normal.  ? ? ?INTAKE/OUTPUT:  ?This shift: No intake/output data recorded.  ?Last 2 shifts: @IOLAST2SHIFTS @ ? ?Labs:  ? ?  Latest Ref Rng & Units 07/22/2021  ? 12:28 PM 03/15/2020  ?  4:21 AM 03/12/2020  ?  5:04 AM  ?CBC  ?WBC 4.0 - 10.5 K/uL 8.2   8.0   8.8    ?Hemoglobin 13.0 - 17.0 g/dL 03/14/2020   87.5   64.3    ?Hematocrit 39.0 - 52.0 % 52.5   39.7   43.6    ?Platelets 150 - 400 K/uL 244   222   194    ? ? ?  Latest Ref Rng & Units 07/22/2021  ? 12:28 PM 03/15/2020  ?  4:21 AM 03/13/2020  ?  6:34 AM  ?CMP  ?Glucose 70 - 99 mg/dL 03/15/2020   518   841    ?BUN 6 - 20 mg/dL 17   10   13     ?Creatinine 0.61 - 1.24 mg/dL 660     6.30    ?Sodium 135 - 145 mmol/L 132   137   137    ?Potassium 3.5 - 5.1 mmol/L 4.1   4.1   3.9    ?Chloride 98 - 111 mmol/L 93   104   100    ?CO2 22 - 32 mmol/L 30   25   26     ?Calcium 8.9 - 10.3 mg/dL 9.0   8.9   8.8    ?Total Protein 6.5 - 8.1 g/dL 8.1    6.5    ?Total Bilirubin 0.3 - 1.2 mg/dL 1.5    1.2    ?Alkaline Phos 38 - 126 U/L 48    50    ?AST 15 - 41 U/L 54    19    ?ALT 0 - 44 U/L 76    25    ? ? ? ?Imaging studies:  ? ?CT Abdomen/Pelvis (07/22/2021) personally reviewed  which does show distended stomach and small bowel with transition near previous small bowel anastomosis in ileum, no free air, and radiologist report reviewed below:  ?IMPRESSION: ?1. Small-bowel obstruction with

## 2021-07-22 NOTE — ED Notes (Signed)
Pt requesting water. Pt NPO per MD Corky Downs ?

## 2021-07-22 NOTE — ED Triage Notes (Signed)
Pt via POV from home. Pt c/o lower abd pain for the past couple of days. Pt also has been vomiting. Pt has a hx of multiple SBO. Pt sent over from [facility] and states abd was tender upon palpation. Pt is A&OX4 and NAD ?

## 2021-07-23 ENCOUNTER — Inpatient Hospital Stay: Payer: BC Managed Care – PPO

## 2021-07-23 ENCOUNTER — Encounter: Payer: Self-pay | Admitting: Surgery

## 2021-07-23 DIAGNOSIS — K56609 Unspecified intestinal obstruction, unspecified as to partial versus complete obstruction: Secondary | ICD-10-CM | POA: Diagnosis not present

## 2021-07-23 LAB — BASIC METABOLIC PANEL
Anion gap: 8 (ref 5–15)
BUN: 15 mg/dL (ref 6–20)
CO2: 29 mmol/L (ref 22–32)
Calcium: 8.3 mg/dL — ABNORMAL LOW (ref 8.9–10.3)
Chloride: 99 mmol/L (ref 98–111)
Creatinine, Ser: 0.86 mg/dL (ref 0.61–1.24)
GFR, Estimated: >60 mL/min (ref >60)
Glucose, Bld: 122 mg/dL — ABNORMAL HIGH (ref 70–99)
Potassium: 3.9 mmol/L (ref 3.5–5.1)
Sodium: 136 mmol/L (ref 135–145)

## 2021-07-23 LAB — CBC
HCT: 46.1 % (ref 39.0–52.0)
Hemoglobin: 15.5 g/dL (ref 13.0–17.0)
MCH: 29.8 pg (ref 26.0–34.0)
MCHC: 33.6 g/dL (ref 30.0–36.0)
MCV: 88.7 fL (ref 80.0–100.0)
Platelets: 205 10*3/uL (ref 150–400)
RBC: 5.2 MIL/uL (ref 4.22–5.81)
RDW: 12.2 % (ref 11.5–15.5)
WBC: 6.9 10*3/uL (ref 4.0–10.5)
nRBC: 0 % (ref 0.0–0.2)

## 2021-07-23 LAB — CBG MONITORING, ED
Glucose-Capillary: 103 mg/dL — ABNORMAL HIGH (ref 70–99)
Glucose-Capillary: 114 mg/dL — ABNORMAL HIGH (ref 70–99)
Glucose-Capillary: 123 mg/dL — ABNORMAL HIGH (ref 70–99)
Glucose-Capillary: 92 mg/dL (ref 70–99)

## 2021-07-23 LAB — URINALYSIS, ROUTINE W REFLEX MICROSCOPIC
Bilirubin Urine: NEGATIVE
Glucose, UA: NEGATIVE mg/dL
Hgb urine dipstick: NEGATIVE
Ketones, ur: 20 mg/dL — AB
Leukocytes,Ua: NEGATIVE
Nitrite: NEGATIVE
Protein, ur: NEGATIVE mg/dL
Specific Gravity, Urine: >1.046 — ABNORMAL HIGH (ref 1.005–1.030)
pH: 5 (ref 5.0–8.0)

## 2021-07-23 LAB — GLUCOSE, CAPILLARY
Glucose-Capillary: 86 mg/dL (ref 70–99)
Glucose-Capillary: 82 mg/dL (ref 70–99)

## 2021-07-23 LAB — MAGNESIUM: Magnesium: 2.3 mg/dL (ref 1.7–2.4)

## 2021-07-23 LAB — PHOSPHORUS: Phosphorus: 3.6 mg/dL (ref 2.5–4.6)

## 2021-07-23 MED ORDER — DEXTROSE 50 % IV SOLN
12.5000 g | INTRAVENOUS | Status: AC
  Administered 2021-07-23: 12.5 g via INTRAVENOUS

## 2021-07-23 MED ORDER — DEXTROSE-NACL 5-0.9 % IV SOLN: INTRAVENOUS | Status: DC

## 2021-07-23 MED ORDER — DIATRIZOATE MEGLUMINE & SODIUM 66-10 % PO SOLN
90.0000 mL | Freq: Once | ORAL | Status: AC
  Administered 2021-07-23: 90 mL via NASOGASTRIC

## 2021-07-23 MED ORDER — DEXTROSE 50 % IV SOLN
INTRAVENOUS | Status: AC
  Filled 2021-07-23: qty 50

## 2021-07-23 NOTE — ED Notes (Addendum)
Pt requesting food,  informed NPO status ?

## 2021-07-23 NOTE — ED Notes (Signed)
Pt medicated for pain 9/10

## 2021-07-23 NOTE — ED Notes (Signed)
Gastrografin administered through NG tube. Radiology contacted and will get pt in 8 hours.  ?

## 2021-07-23 NOTE — ED Notes (Signed)
NG tube unclamped and set up to suction per SOB protocol.  ?

## 2021-07-23 NOTE — ED Notes (Signed)
Pt assisted to bedside commode. Pt had large amount of liquid stool. Denies any discomfort at this time. Call bell in reach. Bed Linens changed. Warm blanket provided.  ?

## 2021-07-23 NOTE — Progress Notes (Signed)
[facility] ?SURGICAL PROGRESS NOTE (cpt [phone]) ? ?Hospital Day(s): 1.  ? ?Interval History: Patient seen and examined, no acute events or new complaints overnight. Patient reports he is feeling a little better this morning. He did have one episode of abdominal pain this morning. Distension improved some. No fever, chills, nausea, emesis. He remains without leukocytosis; WBC 6.2K. renal function remains normal; sCr - 0.86. No electrolyte derangements. NGT output has not been recorded unfortunately. KUB still with persistent SBO pattern. No flatus.  ? ?Review of Systems:  ?Constitutional: denies fever, chills  ?HEENT: denies cough or congestion  ?Respiratory: denies any shortness of breath  ?Cardiovascular: denies chest pain or palpitations  ?Gastrointestinal: + abdominal pain (improved), denied N/V, or diarrhea ?Genitourinary: denies burning with urination or urinary frequency ?Musculoskeletal: denies pain, decreased motor or sensation ? ?Vital signs in last 24 hours: [min-max] current  ?Temp:  [97.8 ?F (36.6 ?C)] 97.8 ?F (36.6 ?C) (04/10 1227) ?Pulse Rate:  [82-108] 82 (04/11 0500) ?Resp:  [17-20] 17 (04/11 0430) ?BP: (92-118)/(67-89) 95/73 (04/11 0500) ?SpO2:  [93 %-100 %] 98 % (04/11 0500) ?Weight:  [66.7 kg] 66.7 kg (04/10 1225)     Height: 5\' 7"  (170.2 cm) Weight: 66.7 kg BMI (Calculated): 23.02  ? ?Intake/Output last 2 shifts:  ?04/10 0701 - 04/11 0700 ?In: -  ?Out: 800 [Urine:800]  ? ?Physical Exam:  ?Constitutional: alert, cooperative and no distress  ?HENT: normocephalic without obvious abnormality: NGT in place, I did advance this ~5 cm ?Eyes: PERRL, EOM's grossly intact and symmetric  ?Respiratory: breathing non-labored at rest  ?Cardiovascular: regular rate and sinus rhythm  ?Gastrointestinal: Abdomen remains distended, some mild improvement, still tympanic. He does not appear tender this morning. No rebound/guarding.  ?Musculoskeletal: no edema or wounds, motor and sensation grossly  intact, NT  ? ? ?Labs:  ? ?  Latest Ref Rng & Units 07/23/2021  ?  4:20 AM 07/22/2021  ? 12:28 PM 03/15/2020  ?  4:21 AM  ?CBC  ?WBC 4.0 - 10.5 K/uL 6.9   8.2   8.0    ?Hemoglobin 13.0 - 17.0 g/dL 14/05/2019   18.8   41.6    ?Hematocrit 39.0 - 52.0 % 46.1   52.5   39.7    ?Platelets 150 - 400 K/uL 205   244   222    ? ? ?  Latest Ref Rng & Units 07/23/2021  ?  4:20 AM 07/22/2021  ? 12:28 PM 03/15/2020  ?  4:21 AM  ?CMP  ?Glucose 70 - 99 mg/dL 14/05/2019   301   601    ?BUN 6 - 20 mg/dL 15   17   10     ?Creatinine 0.61 - 1.24 mg/dL 093     2.35    ?Sodium 135 - 145 mmol/L 136   132   137    ?Potassium 3.5 - 5.1 mmol/L 3.9   4.1   4.1    ?Chloride 98 - 111 mmol/L 99   93   104    ?CO2 22 - 32 mmol/L 29   30   25     ?Calcium 8.9 - 10.3 mg/dL 8.3   9.0   8.9    ?Total Protein 6.5 - 8.1 g/dL  8.1     ?Total Bilirubin 0.3 - 1.2 mg/dL  1.5     ?Alkaline Phos 38 - 126 U/L  48     ?AST 15 - 41 U/L  54     ?ALT 0 -  44 U/L  76     ? ? ? ?Imaging studies:  ? ?KUB (07/23/2021) personally reviewed with persistent SBO pattern, and radiologist report reviewed below:  ?IMPRESSION: ?1. Stable small-bowel obstruction gas pattern from the CT yesterday. ?No free air. ?2. Stable enteric tube terminating in the stomach. Consider ?advancing 5 cm to ensure side hole placement distal to the GEJ. ? ? ?Assessment/Plan: (ICD-10's:  K56.609) ?61 y.o. male with clinically persistent SBO with transition near previous small bowel anastomosis similar to presentation in 2021 for the same.  ?  ?- Continue NGT decompression; LIS; monitor and record output; I did advance this 5 cm this morning  ?- No indication for emergent surgical intervention. He, and his wife, understand that should he fail to improve with conservative measures, we would need to consider this.  ?           - NPO + IVF Resuscitation ?- Monitor abdominal examination; on-going bowel function ?- Serial KUBs; will get gastrografin challenge ?- Pain control prn; antiemetics prn ?- Mobilization as  tolerated ?- Hold home medications; add SSI, IV antihypertensives --> Okay to restart home medications once tolerating PO  ? ?All of the above findings and recommendations were discussed with the patient, and the medical team, and all of patient's questions were answered to his expressed satisfaction. ? ?-- ?Lynden Oxford, PA-C ?[facility] ?07/23/2021, 7:25 AM ?[phone] ?M-F: 7am - 4pm ? ?

## 2021-07-24 DIAGNOSIS — K56609 Unspecified intestinal obstruction, unspecified as to partial versus complete obstruction: Secondary | ICD-10-CM | POA: Diagnosis not present

## 2021-07-24 LAB — COMPREHENSIVE METABOLIC PANEL
ALT: 49 U/L — ABNORMAL HIGH (ref 0–44)
AST: 35 U/L (ref 15–41)
Albumin: 3.3 g/dL — ABNORMAL LOW (ref 3.5–5.0)
Alkaline Phosphatase: 36 U/L — ABNORMAL LOW (ref 38–126)
Anion gap: 5 (ref 5–15)
BUN: 12 mg/dL (ref 6–20)
CO2: 28 mmol/L (ref 22–32)
Calcium: 8.3 mg/dL — ABNORMAL LOW (ref 8.9–10.3)
Chloride: 104 mmol/L (ref 98–111)
Creatinine, Ser: 0.76 mg/dL (ref 0.61–1.24)
GFR, Estimated: >60 mL/min (ref >60)
Glucose, Bld: 104 mg/dL — ABNORMAL HIGH (ref 70–99)
Potassium: 3.6 mmol/L (ref 3.5–5.1)
Sodium: 137 mmol/L (ref 135–145)
Total Bilirubin: 0.9 mg/dL (ref 0.3–1.2)
Total Protein: 6.3 g/dL — ABNORMAL LOW (ref 6.5–8.1)

## 2021-07-24 LAB — GLUCOSE, CAPILLARY
Glucose-Capillary: 118 mg/dL — ABNORMAL HIGH (ref 70–99)
Glucose-Capillary: 129 mg/dL — ABNORMAL HIGH (ref 70–99)
Glucose-Capillary: 148 mg/dL — ABNORMAL HIGH (ref 70–99)
Glucose-Capillary: 152 mg/dL — ABNORMAL HIGH (ref 70–99)
Glucose-Capillary: 59 mg/dL — ABNORMAL LOW (ref 70–99)
Glucose-Capillary: 98 mg/dL (ref 70–99)
Glucose-Capillary: 99 mg/dL (ref 70–99)

## 2021-07-24 LAB — CBC
HCT: 41.1 % (ref 39.0–52.0)
Hemoglobin: 13.6 g/dL (ref 13.0–17.0)
MCH: 29.4 pg (ref 26.0–34.0)
MCHC: 33.1 g/dL (ref 30.0–36.0)
MCV: 89 fL (ref 80.0–100.0)
Platelets: 198 10*3/uL (ref 150–400)
RBC: 4.62 MIL/uL (ref 4.22–5.81)
RDW: 12.5 % (ref 11.5–15.5)
WBC: 5.8 10*3/uL (ref 4.0–10.5)
nRBC: 0 % (ref 0.0–0.2)

## 2021-07-24 MED ORDER — CYCLOBENZAPRINE HCL 10 MG PO TABS: 10.0000 mg | ORAL_TABLET | Freq: Two times a day (BID) | ORAL | Status: DC | PRN

## 2021-07-24 MED ORDER — LISINOPRIL 10 MG PO TABS
10.0000 mg | ORAL_TABLET | Freq: Every day | ORAL | Status: DC
  Administered 2021-07-24 – 2021-07-25 (×2): 10 mg via ORAL
  Filled 2021-07-24 (×2): qty 1

## 2021-07-24 NOTE — TOC Initial Note (Signed)
Transition of Care (TOC) - Initial/Assessment Note  ? ? ?Patient Details  ?Name: Scott [name] ?MRN: [id number] ?Date of Birth: 1960-11-08 ? ?Transition of Care (TOC) CM/SW Contact:    ?Truddie Hidden, RN ?Phone Number: ?07/24/2021, 3:40 PM ? ?Clinical Narrative:                 ? ?Transition of Care (TOC) Screening Note ? ? ?Patient Details  ?Name: Scott [name] ?Date of Birth: 05-Jun-1960 ? ? ?Transition of Care (TOC) CM/SW Contact:    ?Truddie Hidden, RN ?Phone Number: ?07/24/2021, 3:40 PM ? ? ? ?Transition of Care Department [facility]) has reviewed patient and no TOC needs have been identified at this time. We will continue to monitor patient advancement through interdisciplinary progression rounds. If new patient transition needs arise, please place a TOC consult. ? ? ? ?  ?  ? ? ?Patient Goals and CMS Choice ?  ?  ?  ? ?Expected Discharge Plan and Services ?  ?  ?  ?  ?  ?                ?  ?  ?  ?  ?  ?  ?  ?  ?  ?  ? ?Prior Living Arrangements/Services ?  ?  ?  ?       ?  ?  ?  ?  ? ?Activities of Daily Living ?Home Assistive Devices/Equipment: None ?ADL Screening (condition at time of admission) ?Patient's cognitive ability adequate to safely complete daily activities?: Yes ?Is the patient deaf or have difficulty hearing?: No ?Does the patient have difficulty seeing, even when wearing glasses/contacts?: No ?Does the patient have difficulty concentrating, remembering, or making decisions?: No ?Patient able to express need for assistance with ADLs?: Yes ?Does the patient have difficulty dressing or bathing?: No ?Independently performs ADLs?: Yes (appropriate for developmental age) ?Does the patient have difficulty walking or climbing stairs?: No ?Weakness of Legs: None ?Weakness of Arms/Hands: None ? ?Permission Sought/Granted ?  ?  ?   ?   ?   ?   ? ?Emotional Assessment ?  ?  ?  ?  ?  ?  ? ?Admission diagnosis:  Small bowel obstruction (HCC) [K56.609] ?Patient Active Problem List  ? Diagnosis Date Noted  ?  History of resection of small bowel 03/10/2020  ? DM type 2 with diabetic mixed hyperlipidemia (HCC) 05/27/2019  ? Benign essential hypertension 05/27/2019  ? Diabetes mellitus without complication (HCC) 02/17/2019  ? Pain due to onychomycosis of toenails of both feet 02/17/2019  ? Injury of radial artery, left, subsequent encounter 07/28/2018  ? Small bowel obstruction (HCC) 02/05/2016  ? Hypertension 10/29/2015  ? Controlled diabetes mellitus type II without complication (HCC) 10/29/2015  ? Onychomycosis 10/29/2015  ? SBO (small bowel obstruction) (HCC) 09/27/2015  ? ?PCP:  Danella Penton, MD ?Pharmacy:   ?[id number] - [place], [place] - [address] AT [facility] [address] ?[address] ?[place] [address] ?Phone: [phone] Fax: [phone] ? ?[id number] - [place], [place] - [address] ?[address] ?[place] [address] ?Phone: [phone] Fax: [phone] ? ? ? ? ?Social Determinants of Health (SDOH) Interventions ?  ? ?Readmission Risk Interventions ?   ? View : No data to display.  ?  ?  ?  ? ? ? ?

## 2021-07-24 NOTE — Progress Notes (Signed)
Mobility Specialist - Progress Note ? ? 07/24/21 1200  ?Mobility  ?Activity Ambulated independently in hallway  ?Level of Assistance Independent  ?Assistive Device Other (Comment) ?(IV Pole)  ?Distance Ambulated (ft) 400 ft  ?Activity Response Tolerated well  ?$Mobility charge 1 Mobility  ? ? ?Pt ambulates 2 laps around nursing station using IV Pole. No signs of SOB or LOB and pt voices no complaints. Pt returns to room with needs in reach. ? ?Scott [name] ?Mobility Specialist ?07/24/21, 12:44 PM ? ? ? ? ?

## 2021-07-24 NOTE — Progress Notes (Signed)
[facility] ?SURGICAL PROGRESS NOTE (cpt [phone]) ? ?Hospital Day(s): 2.  ? ?Interval History: Patient seen and examined, no acute events or new complaints overnight. Patient reports he is feeling back towards his baseline. No abdominal pain, distension resolved, no fever, chills, nausea, emesis. He is without leukocytosis. Renal function normal; sCr - 0.76; UO - 300 ccs. Hypoglycemia overnight resolved. NGT output down to 50 ccs in 24 hours. He had gastrografin KUB which showed contrast throughout the colon. He is passing flatus and had multiple BMs.  ? ?Review of Systems:  ?Constitutional: denies fever, chills  ?HEENT: denies cough or congestion  ?Respiratory: denies any shortness of breath  ?Cardiovascular: denies chest pain or palpitations  ?Gastrointestinal: denies abdominal pain, N/V, or diarrhea/and bowel function as per interval history ?Genitourinary: denies burning with urination or urinary frequency ? ?Vital signs in last 24 hours: [min-max] current  ?Temp:  [97.5 ?F (36.4 ?C)-97.7 ?F (36.5 ?C)] 97.7 ?F (36.5 ?C) (04/12 0319) ?Pulse Rate:  [79-88] 79 (04/12 0319) ?Resp:  [16-18] 18 (04/12 0319) ?BP: (96-146)/(53-87) 96/53 (04/12 0319) ?SpO2:  [96 %-99 %] 96 % (04/12 0319)     Height: 5\' 7"  (170.2 cm) Weight: 66.7 kg BMI (Calculated): 23.02  ? ?Intake/Output last 2 shifts:  ?04/11 0701 - 04/12 0700 ?In: 2694.9 [I.V.:2694.9] ?Out: 350 [Urine:300; Emesis/NG output:50]  ? ?Physical Exam:  ?Constitutional: alert, cooperative and no distress  ?HENT: normocephalic without obvious abnormality; NGT in place (removed) ?Eyes: PERRL, EOM's grossly intact and symmetric  ?Respiratory: breathing non-labored at rest  ?Cardiovascular: regular rate and sinus rhythm  ?Gastrointestinal: Soft, non-tender, non-distended, no rebound/guarding  ?Musculoskeletal: no edema or wounds, motor and sensation grossly intact, NT  ? ? ?Labs:  ? ?  Latest Ref Rng & Units 07/24/2021  ?  3:31 AM 07/23/2021  ?  4:20 AM 07/22/2021   ? 12:28 PM  ?CBC  ?WBC 4.0 - 10.5 K/uL 5.8   6.9   8.2    ?Hemoglobin 13.0 - 17.0 g/dL 13.6   15.5   17.4    ?Hematocrit 39.0 - 52.0 % 41.1   46.1   52.5    ?Platelets 150 - 400 K/uL 198   205   244    ? ? ?  Latest Ref Rng & Units 07/24/2021  ?  3:31 AM 07/23/2021  ?  4:20 AM 07/22/2021  ? 12:28 PM  ?CMP  ?Glucose 70 - 99 mg/dL 104   122   184    ?BUN 6 - 20 mg/dL 12   15   17     ?Creatinine 0.61 - 1.24 mg/dL 0.76   0.86   0.96    ?Sodium 135 - 145 mmol/L 137   136   132    ?Potassium 3.5 - 5.1 mmol/L 3.6   3.9   4.1    ?Chloride 98 - 111 mmol/L 104   99   93    ?CO2 22 - 32 mmol/L 28   29   30     ?Calcium 8.9 - 10.3 mg/dL 8.3   8.3   9.0    ?Total Protein 6.5 - 8.1 g/dL 6.3    8.1    ?Total Bilirubin 0.3 - 1.2 mg/dL 0.9    1.5    ?Alkaline Phos 38 - 126 U/L 36    48    ?AST 15 - 41 U/L 35    54    ?ALT 0 - 44 U/L 49    76    ? ? ? ?  Imaging studies:  ? ?KUB - 8-Hr delay with gastrografin (07/23/2021) personally reviewed which shows contrast throughout the colon and decrease in small bowel dilation, and radiologist report reviewed below:  ?IMPRESSION: ?1. Administered enteric contrast has progressed to the ascending, ?transverse, descending and sigmoid colon. ?2. Prominent loop of small bowel in the central abdomen persists ?with suggestion of wall thickening, possible enteritis. ? ? ?Assessment/Plan: (ICD-10's: K56.609) ?61 y.o. male with clinically resolved SBO with transition near previous small bowel anastomosis similar to presentation in 2021 for the same.  ?  ?- NGT removed this morning without issues ?- Initiate CLD; ADAT ? - Wean from IVF as diet advances ? - No indication for surgical interventions  ?- Monitor abdominal examination; on-going bowel function ?- Pain control prn; antiemetics prn ?- Mobilization as tolerated ?- Restart home medications; continue SSI while in hospital   ?  ?All of the above findings and recommendations were discussed with the patient, and the medical team, and all of patient's  questions were answered to his expressed satisfaction. ? ?-- ?Edison Simon, PA-C ?[facility] ?07/24/2021, 7:31 AM ?M-F: 7am - 4pm ? ?

## 2021-07-24 NOTE — Plan of Care (Signed)
  Problem: Nutrition: Goal: Adequate nutrition will be maintained Outcome: Not Progressing   Problem: Elimination: Goal: Will not experience complications related to bowel motility Outcome: Not Progressing   

## 2021-07-25 DIAGNOSIS — K56609 Unspecified intestinal obstruction, unspecified as to partial versus complete obstruction: Secondary | ICD-10-CM | POA: Diagnosis not present

## 2021-07-25 LAB — GLUCOSE, CAPILLARY
Glucose-Capillary: 100 mg/dL — ABNORMAL HIGH (ref 70–99)
Glucose-Capillary: 102 mg/dL — ABNORMAL HIGH (ref 70–99)
Glucose-Capillary: 105 mg/dL — ABNORMAL HIGH (ref 70–99)
Glucose-Capillary: 143 mg/dL — ABNORMAL HIGH (ref 70–99)

## 2021-07-25 NOTE — Discharge Summary (Signed)
[facility] ?SURGICAL DISCHARGE SUMMARY (cpt: [phone]) ? ?Patient ID: ?Scott [name] ?MRN: [id number] ?DOB/AGE: 08/22/1960 61 y.o. ? ?Admit date: 07/22/2021 ?Discharge date: 07/25/2021 ? ?Discharge Diagnoses ?Patient Active Problem List  ? Diagnosis Date Noted  ? SBO (small bowel obstruction) (HCC) 09/27/2015  ? ? ?Consultants ?None ? ?Procedures ?None ? ?HPI: 61 y.o. male presented to [facility] ED today for abdominal pain and distension. Patient reports around a 3 day history of progressively worsening lower abdominal pain, distension, nausea, and emesis. No fever, chills, CP, SOB, or urinary changes. He did pass flatus yesterday but nothing today. This is very similar to his previous admission in Nov/Dec of 2021 for SBO as well. This was managed conservatively with NGT decompression, and he has done well up until 3 days ago. He reports that he typical had gone up to 5 years in between obstructions prior to this one recurring in 1.5 years. He has a significant history of abdominal surgeries including appendectomy, cholecystectomy, prior small bowel resection for Meckel's diverticulum, prior exploratory laparotomy with lysis of adhesions and small bowel resection for small bowel obstruction. Work up in the ED revealed a normal WBC at 5.2K, Hgb to 17.4 likely indicating hemoconcentration, normal sCr at - 0.96, and no electrolyte derangements. CT Abdomen/Pelvis was obtained and concerning for SBO with transition zone near anastomosis in distal small bowel, which is similar in appearance to 2021.  ? ?Hospital Course: Patient was admitted to the medicine service and had NGT placed. He underwent gastrografin challenge which showed contrast transit throughout the entire colon along with return of bowel function. NGT was removed on HD2. Advancement of patient's diet and ambulation were well-tolerated. The remainder of patient's hospital course was essentially unremarkable, and discharge planning was initiated  accordingly with patient safely able to be discharged home with appropriate discharge instructions, pain control, and outpatient follow-up after all of his questions were answered to his expressed satisfaction.  ? ?Discharge Condition: Good ? ? ?Physical Examination:  ?Constitutional: alert, cooperative and no distress  ?HENT: normocephalic without obvious abnormality ?Eyes: PERRL, EOM's grossly intact and symmetric  ?Respiratory: breathing non-labored at rest  ?Cardiovascular: regular rate and sinus rhythm  ?Gastrointestinal: Soft, non-tender, non-distended, no rebound/guarding  ?Musculoskeletal: no edema or wounds, motor and sensation grossly intact, NT  ? ? ?Allergies as of 07/25/2021   ?No Known Allergies ?  ? ?  ?Medication List  ?  ? ?TAKE these medications   ? ?Aspirin Low Dose 81 MG EC tablet ?Generic drug: aspirin ?TAKE 1 TABLET BY MOUTH DAILY ?  ?cyclobenzaprine 10 MG tablet ?Commonly known as: FLEXERIL ?Take 10 mg by mouth 2 (two) times daily as needed for muscle spasms. ?  ?lisinopril 10 MG tablet ?Commonly known as: ZESTRIL ?Take 1 tablet (10 mg total) by mouth daily. ?  ?metFORMIN 500 MG tablet ?Commonly known as: GLUCOPHAGE ?Take 1 tablet (500 mg total) by mouth 2 (two) times daily with a meal. ?  ?naproxen 500 MG tablet ?Commonly known as: NAPROSYN ?Take 500 mg by mouth 2 (two) times daily as needed for mild pain. ?  ?sildenafil 20 MG tablet ?Commonly known as: REVATIO ?TAKE 1-3 TABLETS (20-60 MG TOTAL) BY MOUTH AS NEEDED (TO OBTAIN AN ERECTION). ?  ? ?  ? ? ? ? Follow-up Information   ? ? Donovan Kail, PA-C. Schedule an appointment as soon as possible for a visit in 3 week(s).   ?Specialty: Physician Assistant ?Why: Hospital follow up; SBO ?Contact information: ?[address] ?  [address] ?[place] [address] ?[phone] ? ? ?  ?  ? ?  ?  ? ?  ? ? ? ?Time spent on discharge management including discussion of hospital course, clinical condition, outpatient instructions, prescriptions, and follow  up with the patient and members of the medical team: >30 minutes ? ?-- ?Lynden Oxford , PA-C ?[facility]  ?07/25/2021, 9:24 AM ?[phone] ?M-F: 7am - 4pm ? ?

## 2021-07-25 NOTE — Progress Notes (Signed)
Discharge instructions reviewed with patient including followup visits.  Understanding was verbalized and all questions were answered.  IV removed without complication; patient tolerated well.  Patient discharged home via wheelchair in stable condition escorted by volunteer staff.  

## 2021-08-15 ENCOUNTER — Encounter: Payer: Self-pay | Admitting: Physician Assistant

## 2021-08-15 ENCOUNTER — Ambulatory Visit (INDEPENDENT_AMBULATORY_CARE_PROVIDER_SITE_OTHER): Payer: BC Managed Care – PPO | Admitting: Physician Assistant

## 2021-08-15 VITALS — BP 161/100 | HR 101 | Temp 98.1°F | Ht 67.0 in | Wt 151.2 lb

## 2021-08-15 DIAGNOSIS — K56609 Unspecified intestinal obstruction, unspecified as to partial versus complete obstruction: Secondary | ICD-10-CM

## 2021-08-15 NOTE — Progress Notes (Signed)
[facility] ?SURGICAL CLINIC NOTE ? ?08/15/2021 ? ?History of Present Illness: ?Scott [name] is a 61 y.o. male known to our service secondary to multiple admission now for small bowel obstructions. First admission was from 11/27 - 12/03 which was managed conservatively. He did well after this. However, he did have amore recent admission from 04/10 - 04/13 for the same. Thankfully, we were again able to manage this with conservative measures. He presents today in follow up. He is doing very well at home. No abdominal pain, nausea, emesis, fever, chills, or bowel changes. He is tolerating PO without issue and bowel function is normal. Again, he has a significant history of abdominal surgeries including appendectomy, cholecystectomy, prior small bowel resection for Meckel's diverticulum, prior exploratory laparotomy with lysis of adhesions and small bowel resection for small bowel obstruction. Otherwise no further complaints.  ? ?Past Medical History: ?Past Medical History:  ?Diagnosis Date  ? Allergy   ? Diabetes mellitus without complication (HCC)   ? pt. stated "I don't have diabetes", says last time he was in hospital the dx changed  ? History of kidney stones   ? H/O STONES  ? Hypertension   ?  ? ?Past Surgical History: ?Past Surgical History:  ?Procedure Laterality Date  ? APPENDECTOMY    ? CHOLECYSTECTOMY    ? COLONOSCOPY WITH PROPOFOL N/A 06/24/2017  ? Procedure: COLONOSCOPY WITH PROPOFOL;  Surgeon: Earline Mayotte, MD;  Location: [facility] ENDOSCOPY;  Service: Endoscopy;  Laterality: N/A;  ? LAPAROTOMY N/A 02/05/2016  ? Procedure: EXPLORATORY LAPAROTOMY FOR SMALL BOWEL OBSTRUCTION;  Surgeon: Kieth Brightly, MD;  Location: [facility] ORS;  Service: General;  Laterality: N/A;  ? LYSIS OF ADHESION  02/05/2016  ? Procedure: LYSIS OF ADHESION;  Surgeon: Kieth Brightly, MD;  Location: [facility] ORS;  Service: General;;  ? SMALL INTESTINE SURGERY  2005  ? Excision of Meckel's diverticulum  ? VEIN  REPAIR Left 07/28/2018  ? Procedure: RADIAL ARTERY LIGATION LEFT;  Surgeon: Annice Needy, MD;  Location: [facility] ORS;  Service: Vascular;  Laterality: Left;  ? ? ?Home Medications: ?Prior to Admission medications   ?Medication Sig Start Date End Date Taking? Authorizing Provider  ?ASPIRIN LOW DOSE 81 MG EC tablet TAKE 1 TABLET BY MOUTH DAILY 09/01/19  Yes Georgiana Spinner, NP  ?cyclobenzaprine (FLEXERIL) 10 MG tablet Take 10 mg by mouth 2 (two) times daily as needed for muscle spasms.   Yes [provider]  ?lisinopril (ZESTRIL) 10 MG tablet Take 1 tablet (10 mg total) by mouth daily. 01/27/19  Yes Galen Manila, NP  ?metFORMIN (GLUCOPHAGE) 500 MG tablet Take 1 tablet (500 mg total) by mouth 2 (two) times daily with a meal. 01/27/19  Yes Galen Manila, NP  ?naproxen (NAPROSYN) 500 MG tablet Take 500 mg by mouth 2 (two) times daily as needed for mild pain.   Yes [provider]  ?sildenafil (REVATIO) 20 MG tablet TAKE 1-3 TABLETS (20-60 MG TOTAL) BY MOUTH AS NEEDED (TO OBTAIN AN ERECTION). 05/06/19  Yes Smitty Cords, DO  ? ? ?Allergies: ?No Known Allergies ? ?Review of Systems: ?Review of Systems  ?Constitutional:  Negative for chills and fever.  ?HENT:  Negative for congestion and sore throat.   ?Respiratory:  Negative for cough and shortness of breath.   ?Cardiovascular:  Negative for chest pain and palpitations.  ?Gastrointestinal:  Negative for abdominal pain, blood in stool, constipation, diarrhea, nausea and vomiting.  ?Genitourinary:  Negative for dysuria and  urgency.  ?All other systems reviewed and are negative. ? ?Physical Exam ?BP (!) 161/100   Pulse (!) 101   Temp 98.1 ?F (36.7 ?C) (Oral)   Ht 5\' 7"  (1.702 m)   Wt 151 lb 3.2 oz (68.6 kg)   SpO2 98%   BMI 23.68 kg/m?  ? ?Physical Exam ?Vitals and nursing note reviewed.  ?Constitutional:   ?   General: He is not in acute distress. ?   Appearance: Normal appearance. He is obese. He is not ill-appearing.  ?HENT:  ?    Head: Normocephalic and atraumatic.  ?Eyes:  ?   General: No scleral icterus. ?   Conjunctiva/sclera: Conjunctivae normal.  ?Cardiovascular:  ?   Rate and Rhythm: Normal rate and regular rhythm.  ?   Pulses: Normal pulses.  ?   Heart sounds: No murmur heard. ?Pulmonary:  ?   Effort: Pulmonary effort is normal. No respiratory distress.  ?   Breath sounds: Normal breath sounds. No wheezing.  ?Abdominal:  ?   General: Abdomen is flat. There is no distension.  ?   Palpations: Abdomen is soft.  ?   Tenderness: There is no abdominal tenderness. There is no guarding or rebound.  ?   Comments: Abdomen is soft, non-tender, non-distended, no rebound or guarding. Previous surgical scars appreciated.   ?Genitourinary: ?   Comments: Deferred ?Musculoskeletal:  ?   Right lower leg: No edema.  ?   Left lower leg: No edema.  ?Skin: ?   General: Skin is warm and dry.  ?   Coloration: Skin is not pale.  ?   Findings: No erythema.  ?Neurological:  ?   General: No focal deficit present.  ?   Mental Status: He is alert and oriented to person, place, and time.  ?Psychiatric:     ?   Mood and Affect: Mood normal.     ?   Behavior: Behavior normal.  ? ? ?Labs/Imaging: ?No new pertinent imaging studies ? ?Assessment and Plan: ?This is a 61 y.o. male with resolved small bowel obstruction, most likely secondary to post-surgical adhesive disease given the numerous intra-abdominal procedures he has undergone.  ? ? - Thankfully, he has continued to do well at home. I reviewed with him the etiology of his obstructions and the likelihood these are related scar tissue inside his abdomen. I again explained the unpredictability of these and that he may never have one again in his life time or he may get one next week. He seems very understanding of this.  ? - I reviewed indications for surgery including, but not limited to, an obstruction that does not resolve with conservative measures or if he starts to have multiple episodes in a short time. I  do not think we are at a point to consider elective lysis of adhesions, but is he starts to recur sooner or more frequently, we may have a lower threshold for proceeding with such. He is understand of this.  ? - He can follow up on as needed basis. I will be happy to see him should needs change or new issues arise.  ? ?Face-to-face time spent with the patient and care providers was  ?20 minutes, with more than 50% of the time spent counseling, educating, and coordinating care of the patient.   ? ? ?41, PA-C ?[facility] ?08/15/2021, 2:27 PM ?M-F: 7am - 4pm  ?

## 2021-08-15 NOTE — Patient Instructions (Signed)
If you have any concerns or questions, please feel free to call our office. Follow up as needed.  ? ? ?Small Bowel Series ? ?A small bowel series is an X-ray test. It is used to check for problems in the small bowel. The small bowel is also called the small intestine. For this test, you will drink a liquid called barium. The liquid (contrast liquid) shows up well on X-rays. This makes it easier for your doctor to see any problems. ?The test can help to find out why you have symptoms such as: ?Belly (abdominal) pain. ?Bloating. ?Watery poop (diarrhea). ?Tell your doctor about: ?Any allergies you have, especially allergies to liquids used in imaging tests. ?All medicines you are taking. This includes vitamins, herbs, eye drops, creams, and over-the-counter medicines. ?Any blood disorders you have. ?Any surgeries you have had. ?Any medical conditions you have. ?Whether you are pregnant or may be pregnant. ?Whether you are breastfeeding. ?What are the risks? ?Usually, this is a safe test. However, problems may happen, such as: ?Feeling like you may vomit (nauseous) after drinking the contrast liquid. ?Cramps. ?Trouble pooping (constipation). ?A blockage in your small bowel getting worse. ?Exposure to a very small amount of radiation. ?Allergic reaction to the contrast liquid. This is rare. ?What happens before the test? ?Follow instructions from your doctor about what you cannot eat or drink. ?Ask your doctor about changing or stopping: ?Your normal medicines. ?Vitamins, herbs, and supplements. ?Over-the-counter medicines. ?What happens during the test? ?You will drink the contrast liquid. It looks like a milkshake. ?Using a type of X-ray, the doctor will watch the contrast liquid as it moves through: ?The part of your body that moves food from your mouth to your stomach (esophagus). ?Your stomach. ?Your small bowel. ?Plain X-ray pictures will be taken often as the contrast liquid moves through your small bowel. These  may be taken every 15-60 minutes. ?You may need to change positions often so the doctor can see all of the small bowel. ?You may need to stand up or lie on a table. ?The table may move or tilt. ?You may need to turn from side to side. ?The procedure may vary among doctors and hospitals. ?What can I expect after the test? ?Your poop (stool) may be white or gray for 2-3 days until all the contrast liquid has passed out of your body in your poop. ?It is up to you to get the results of your test. Ask how to get your results when they are ready. ?Talk with your doctor about what your results mean. ?Follow these instructions at home: ? ?Return to your normal diet as told by your doctor. ?Return to your normal activities when your doctor says that it is safe. ?Check your poop to make sure that it returns to normal color within a few days. ?If told, take actions to prevent trouble pooping and to help get the contrast liquid out of your body. You may need to: ?Drink enough fluid to keep your pee (urine) pale yellow. ?Take medicines. You will be told what medicines to take. ?Eat foods that are high in fiber. These include beans, whole grains, and fresh fruits and vegetables. ?Limit foods that are high in fat and sugar. These include fried or sweet foods. ?Contact a doctor if: ?You have trouble pooping for more than 2 days. ?Your poop still looks white or chalky after 3 days. ?You have cramps or pain. ?You have watery poop. ?You have swelling of your belly. ?You feel  like you may vomit or you vomit. ?You have a fever. ?Get help right away if: ?You are not able to poop. ?You cannot pass gas. ?You have belly pain that gets worse. ?You have itchy, red, swollen areas of skin (hives). ?Your throat swells. ?You have trouble breathing. ?You have a very hard and bloated (distended) belly. ?These symptoms may be an emergency. Get help right away. Call your local emergency services (911 in the [place] ?Do not wait to see if the symptoms  will go away. ?Do not drive yourself to the hospital. ?Summary ?A small bowel series is an X-ray test. It is used to check for problems in the small bowel. ?For this test, you will drink a contrast liquid called barium. ?The liquid shows up well on X-rays and makes it easier for your doctor to see problems. ?After the test, your poop may be white or gray for 2-3 days until all the contrast liquid has passed out of your body. ?This information is not intended to replace advice given to you by your health care provider. Make sure you discuss any questions you have with your health care provider. ?Document Revised: 12/12/2020 Document Reviewed: 05/14/2020 ?Elsevier Patient Education ? 2023 Elsevier Inc. ? ?

## 2021-08-22 ENCOUNTER — Ambulatory Visit
Admission: RE | Admit: 2021-08-22 | Discharge: 2021-08-22 | Disposition: A | Discharge status: Home / Self Care | Payer: BC Managed Care – PPO | Source: Ambulatory Visit | Attending: Internal Medicine | Admitting: Internal Medicine

## 2021-08-22 ENCOUNTER — Other Ambulatory Visit: Payer: Self-pay | Admitting: Internal Medicine

## 2021-08-22 ENCOUNTER — Other Ambulatory Visit: Payer: Self-pay

## 2021-08-22 DIAGNOSIS — G9340 Encephalopathy, unspecified: Secondary | ICD-10-CM

## 2022-02-10 ENCOUNTER — Encounter (INDEPENDENT_AMBULATORY_CARE_PROVIDER_SITE_OTHER): Payer: Self-pay

## 2022-05-05 DIAGNOSIS — E782 Mixed hyperlipidemia: Secondary | ICD-10-CM | POA: Diagnosis not present

## 2022-05-05 DIAGNOSIS — E1169 Type 2 diabetes mellitus with other specified complication: Secondary | ICD-10-CM | POA: Diagnosis not present

## 2022-05-05 DIAGNOSIS — E538 Deficiency of other specified B group vitamins: Secondary | ICD-10-CM | POA: Diagnosis not present

## 2022-05-12 DIAGNOSIS — E782 Mixed hyperlipidemia: Secondary | ICD-10-CM | POA: Diagnosis not present

## 2022-05-12 DIAGNOSIS — Z125 Encounter for screening for malignant neoplasm of prostate: Secondary | ICD-10-CM | POA: Diagnosis not present

## 2022-05-12 DIAGNOSIS — E538 Deficiency of other specified B group vitamins: Secondary | ICD-10-CM | POA: Diagnosis not present

## 2022-05-12 DIAGNOSIS — Z0000 Encounter for general adult medical examination without abnormal findings: Secondary | ICD-10-CM | POA: Diagnosis not present

## 2022-05-12 DIAGNOSIS — E1169 Type 2 diabetes mellitus with other specified complication: Secondary | ICD-10-CM | POA: Diagnosis not present

## 2022-07-18 DIAGNOSIS — F0150 Vascular dementia without behavioral disturbance: Secondary | ICD-10-CM | POA: Diagnosis not present

## 2022-07-18 DIAGNOSIS — R519 Headache, unspecified: Secondary | ICD-10-CM | POA: Diagnosis not present

## 2022-07-22 ENCOUNTER — Other Ambulatory Visit: Payer: Self-pay | Admitting: Internal Medicine

## 2022-07-22 DIAGNOSIS — R519 Headache, unspecified: Secondary | ICD-10-CM

## 2022-07-22 DIAGNOSIS — F0150 Vascular dementia without behavioral disturbance: Secondary | ICD-10-CM

## 2022-08-04 ENCOUNTER — Encounter: Payer: Self-pay | Admitting: Internal Medicine

## 2022-08-13 ENCOUNTER — Ambulatory Visit
Admission: RE | Admit: 2022-08-13 | Discharge: 2022-08-13 | Disposition: A | Discharge status: Home / Self Care | Payer: 59 | Source: Ambulatory Visit | Attending: Internal Medicine | Admitting: Internal Medicine

## 2022-08-13 DIAGNOSIS — F0150 Vascular dementia without behavioral disturbance: Secondary | ICD-10-CM

## 2022-08-13 DIAGNOSIS — R519 Headache, unspecified: Secondary | ICD-10-CM

## 2022-08-13 MED ORDER — GADOPICLENOL 0.5 MMOL/ML IV SOLN
7.5000 mL | Freq: Once | INTRAVENOUS | Status: AC | PRN
  Administered 2022-08-13: 7.5 mL via INTRAVENOUS

## 2023-08-12 ENCOUNTER — Inpatient Hospital Stay
Admission: EM | Admit: 2023-08-12 | Discharge: 2023-08-18 | DRG: 390 | Disposition: A | Discharge status: Home / Self Care | Attending: Surgery | Admitting: Surgery

## 2023-08-12 ENCOUNTER — Emergency Department

## 2023-08-12 ENCOUNTER — Other Ambulatory Visit: Payer: Self-pay

## 2023-08-12 DIAGNOSIS — Z833 Family history of diabetes mellitus: Secondary | ICD-10-CM | POA: Diagnosis not present

## 2023-08-12 DIAGNOSIS — Z7984 Long term (current) use of oral hypoglycemic drugs: Secondary | ICD-10-CM

## 2023-08-12 DIAGNOSIS — E119 Type 2 diabetes mellitus without complications: Secondary | ICD-10-CM | POA: Diagnosis present

## 2023-08-12 DIAGNOSIS — K56600 Partial intestinal obstruction, unspecified as to cause: Principal | ICD-10-CM | POA: Diagnosis present

## 2023-08-12 DIAGNOSIS — L309 Dermatitis, unspecified: Secondary | ICD-10-CM | POA: Diagnosis present

## 2023-08-12 DIAGNOSIS — Z8249 Family history of ischemic heart disease and other diseases of the circulatory system: Secondary | ICD-10-CM

## 2023-08-12 DIAGNOSIS — K56609 Unspecified intestinal obstruction, unspecified as to partial versus complete obstruction: Secondary | ICD-10-CM | POA: Diagnosis present

## 2023-08-12 DIAGNOSIS — Z7982 Long term (current) use of aspirin: Secondary | ICD-10-CM | POA: Diagnosis not present

## 2023-08-12 DIAGNOSIS — I10 Essential (primary) hypertension: Secondary | ICD-10-CM | POA: Diagnosis present

## 2023-08-12 DIAGNOSIS — Z79899 Other long term (current) drug therapy: Secondary | ICD-10-CM | POA: Diagnosis not present

## 2023-08-12 LAB — CBC
HCT: 50.3 % (ref 39.0–52.0)
Hemoglobin: 17.2 g/dL — ABNORMAL HIGH (ref 13.0–17.0)
MCH: 31 pg (ref 26.0–34.0)
MCHC: 34.2 g/dL (ref 30.0–36.0)
MCV: 90.6 fL (ref 80.0–100.0)
Platelets: 233 10*3/uL (ref 150–400)
RBC: 5.55 MIL/uL (ref 4.22–5.81)
RDW: 12.4 % (ref 11.5–15.5)
WBC: 13.4 10*3/uL — ABNORMAL HIGH (ref 4.0–10.5)
nRBC: 0 % (ref 0.0–0.2)

## 2023-08-12 LAB — URINALYSIS, ROUTINE W REFLEX MICROSCOPIC
Bilirubin Urine: NEGATIVE
Glucose, UA: NEGATIVE mg/dL
Hgb urine dipstick: NEGATIVE
Ketones, ur: NEGATIVE mg/dL
Leukocytes,Ua: NEGATIVE
Nitrite: NEGATIVE
Protein, ur: NEGATIVE mg/dL
Specific Gravity, Urine: >1.046 — ABNORMAL HIGH (ref 1.005–1.030)
pH: 5 (ref 5.0–8.0)

## 2023-08-12 LAB — LIPASE, BLOOD: Lipase: 24 U/L (ref 11–51)

## 2023-08-12 LAB — COMPREHENSIVE METABOLIC PANEL WITH GFR
ALT: 68 U/L — ABNORMAL HIGH (ref 0–44)
AST: 54 U/L — ABNORMAL HIGH (ref 15–41)
Albumin: 4.3 g/dL (ref 3.5–5.0)
Alkaline Phosphatase: 47 U/L (ref 38–126)
Anion gap: 10 (ref 5–15)
BUN: 14 mg/dL (ref 6–20)
CO2: 24 mmol/L (ref 22–32)
Calcium: 9.4 mg/dL (ref 8.9–10.3)
Chloride: 97 mmol/L — ABNORMAL LOW (ref 98–111)
Creatinine, Ser: 0.86 mg/dL (ref 0.61–1.24)
GFR, Estimated: >60 mL/min (ref >60)
Glucose, Bld: 167 mg/dL — ABNORMAL HIGH (ref 70–99)
Potassium: 4.8 mmol/L (ref 3.5–5.1)
Sodium: 131 mmol/L — ABNORMAL LOW (ref 135–145)
Total Bilirubin: 1.2 mg/dL (ref 0.0–1.2)
Total Protein: 7.7 g/dL (ref 6.5–8.1)

## 2023-08-12 LAB — HEMOGLOBIN A1C
Hgb A1c MFr Bld: 6.4 % — ABNORMAL HIGH (ref 4.8–5.6)
Mean Plasma Glucose: 136.98 mg/dL

## 2023-08-12 LAB — GLUCOSE, CAPILLARY
Glucose-Capillary: 133 mg/dL — ABNORMAL HIGH (ref 70–99)
Glucose-Capillary: 148 mg/dL — ABNORMAL HIGH (ref 70–99)
Glucose-Capillary: 115 mg/dL — ABNORMAL HIGH (ref 70–99)
Glucose-Capillary: 124 mg/dL — ABNORMAL HIGH (ref 70–99)

## 2023-08-12 MED ORDER — INSULIN ASPART 100 UNIT/ML IJ SOLN
0.0000 [IU] | Freq: Three times a day (TID) | INTRAMUSCULAR | Status: DC
  Administered 2023-08-13 – 2023-08-14 (×2): 1 [IU] via SUBCUTANEOUS
  Administered 2023-08-15 – 2023-08-17 (×4): 2 [IU] via SUBCUTANEOUS
  Administered 2023-08-17: 1 [IU] via SUBCUTANEOUS
  Administered 2023-08-17: 2 [IU] via SUBCUTANEOUS
  Administered 2023-08-18: 1 [IU] via SUBCUTANEOUS
  Filled 2023-08-12 (×13): qty 1

## 2023-08-12 MED ORDER — ENOXAPARIN SODIUM 40 MG/0.4ML IJ SOSY
40.0000 mg | PREFILLED_SYRINGE | INTRAMUSCULAR | Status: DC
  Administered 2023-08-12 – 2023-08-17 (×6): 40 mg via SUBCUTANEOUS
  Filled 2023-08-12 (×6): qty 0.4

## 2023-08-12 MED ORDER — ACETAMINOPHEN 500 MG PO TABS
1000.0000 mg | ORAL_TABLET | Freq: Four times a day (QID) | ORAL | Status: DC
  Administered 2023-08-12 – 2023-08-18 (×21): 1000 mg via ORAL
  Filled 2023-08-12 (×24): qty 2

## 2023-08-12 MED ORDER — INSULIN ASPART 100 UNIT/ML IJ SOLN
0.0000 [IU] | Freq: Every day | INTRAMUSCULAR | Status: DC
Start: 2023-08-12 — End: 2023-08-18

## 2023-08-12 MED ORDER — ONDANSETRON HCL 4 MG/2ML IJ SOLN
4.0000 mg | Freq: Four times a day (QID) | INTRAMUSCULAR | Status: DC | PRN
  Administered 2023-08-15: 4 mg via INTRAVENOUS
  Filled 2023-08-12: qty 2

## 2023-08-12 MED ORDER — OXYCODONE HCL 5 MG PO TABS: 5.0000 mg | ORAL_TABLET | ORAL | Status: DC | PRN

## 2023-08-12 MED ORDER — IOHEXOL 300 MG/ML  SOLN
100.0000 mL | Freq: Once | INTRAMUSCULAR | Status: AC | PRN
  Administered 2023-08-12: 100 mL via INTRAVENOUS

## 2023-08-12 MED ORDER — ONDANSETRON 4 MG PO TBDP: 4.0000 mg | ORAL_TABLET | Freq: Four times a day (QID) | ORAL | Status: DC | PRN

## 2023-08-12 MED ORDER — LISINOPRIL 10 MG PO TABS
10.0000 mg | ORAL_TABLET | Freq: Every day | ORAL | Status: DC
  Administered 2023-08-12 – 2023-08-18 (×7): 10 mg via ORAL
  Filled 2023-08-12 (×7): qty 1

## 2023-08-12 MED ORDER — SODIUM CHLORIDE 0.9 % IV SOLN: INTRAVENOUS | Status: AC

## 2023-08-12 MED ORDER — MORPHINE SULFATE (PF) 2 MG/ML IV SOLN: 2.0000 mg | INTRAVENOUS | Status: DC | PRN

## 2023-08-12 NOTE — ED Triage Notes (Signed)
 Pt to ED via POV from home. Pt ambulatory to triage. Pt reports intermittent abd pain and one episode of emesis. Pt reports has been dry heaving. Wife also reports poor PO intake. Pt hx of SBO. Pt has mild dementia.

## 2023-08-12 NOTE — H&P (Signed)
 [facility] SURGICAL HISTORY & PHYSICAL (cpt [phone])  HISTORY OF PRESENT ILLNESS (HPI):  64 y.o. male presented to [facility] ED today for abdominal pain. Patient's wife provides majority of the history. Reportedly noticed patient with decreased appetite over the last several days. Onset of pain and nausea began last night. Also noted dry heaves as well. He reports continued flatus and BM. No fever, chills, cough, CP, SOB, urinary changes. Wife reports he may be developing early dementia, more forgetful around the house. He does have a history of SBO previously. We have seen him in 2021 and 2023 for the same. These episodes have responded to conservative treatments. He has a significant history of abdominal surgeries including appendectomy, cholecystectomy, prior small bowel resection for Meckel's diverticulum, prior exploratory laparotomy with lysis of adhesions and small bowel resection for small bowel obstruction. Last surgery was 2017. Work up in the ED revealed a leukocytosis to 13.4K, Hgb to 17.2, sCr - 0.86, no electrolyte derangements. CT Abdomen/Pelvis concerning for dilation of small bowel and possible early vs partial obstruction to at the level of his anastomosis, similar to previous exams.   General surgery is consulted by emergency medicine physician Dr Claria Crofts, MD for evaluation and management of SBO  PAST MEDICAL HISTORY (PMH):  Past Medical History:  Diagnosis Date   Allergy    Diabetes mellitus without complication (HCC)    pt. stated "I don't have diabetes", says last time he was in hospital the dx changed   History of kidney stones    H/O STONES   Hypertension     Reviewed. Otherwise negative.   PAST SURGICAL HISTORY [facility]):  Past Surgical History:  Procedure Laterality Date   APPENDECTOMY     CHOLECYSTECTOMY     COLONOSCOPY WITH PROPOFOL  N/A 06/24/2017   Procedure: COLONOSCOPY WITH PROPOFOL ;  Surgeon: Marshall Skeeter, MD;  Location: [facility] ENDOSCOPY;  Service:  Endoscopy;  Laterality: N/A;   LAPAROTOMY N/A 02/05/2016   Procedure: EXPLORATORY LAPAROTOMY FOR SMALL BOWEL OBSTRUCTION;  Surgeon: Jerlean Mood, MD;  Location: [facility] ORS;  Service: General;  Laterality: N/A;   LYSIS OF ADHESION  02/05/2016   Procedure: LYSIS OF ADHESION;  Surgeon: Jerlean Mood, MD;  Location: [facility] ORS;  Service: General;;   SMALL INTESTINE SURGERY  2005   Excision of Meckel's diverticulum   VEIN REPAIR Left 07/28/2018   Procedure: RADIAL ARTERY LIGATION LEFT;  Surgeon: Celso College, MD;  Location: [facility] ORS;  Service: Vascular;  Laterality: Left;    Reviewed. Otherwise negative.   MEDICATIONS:  Prior to Admission medications   Medication Sig Start Date End Date Taking? Authorizing Provider  ASPIRIN  LOW DOSE 81 MG EC tablet TAKE 1 TABLET BY MOUTH DAILY 09/01/19   Brown, Fallon E, NP  cyclobenzaprine  (FLEXERIL ) 10 MG tablet Take 10 mg by mouth 2 (two) times daily as needed for muscle spasms.    [provider]  lisinopril  (ZESTRIL ) 10 MG tablet Take 1 tablet (10 mg total) by mouth daily. 01/27/19   Brenda Calkin, NP  metFORMIN  (GLUCOPHAGE ) 500 MG tablet Take 1 tablet (500 mg total) by mouth 2 (two) times daily with a meal. 01/27/19   Brenda Calkin, NP  naproxen  (NAPROSYN ) 500 MG tablet Take 500 mg by mouth 2 (two) times daily as needed for mild pain.    [provider]  sildenafil  (REVATIO ) 20 MG tablet TAKE 1-3 TABLETS (20-60 MG TOTAL) BY MOUTH AS NEEDED (TO OBTAIN AN ERECTION). 05/06/19   Karamalegos,  Kayleen Party, DO     ALLERGIES:  No Known Allergies   SOCIAL HISTORY:  Social History   Socioeconomic History   Marital status: Married    Spouse name: Not on file   Number of children: Not on file   Years of education: Not on file   Highest education level: Not on file  Occupational History   Not on file  Tobacco Use   Smoking status: Never   Smokeless tobacco: Never  Vaping Use   Vaping status: Never Used   Substance and Sexual Activity   Alcohol use: Not Currently    Alcohol/week: 0.0 standard drinks of alcohol    Comment: occasional   Drug use: No   Sexual activity: Not on file  Other Topics Concern   Not on file  Social History Narrative   Not on file   Social Drivers of Health   Financial Resource Strain: Medium Risk (05/15/2023)   Received from [facility]   Overall Financial Resource Strain (CARDIA)    Difficulty of Paying Living Expenses: Somewhat hard  Food Insecurity: Food Insecurity Present (05/15/2023)   [facility] from [facility]   Hunger Vital Sign    Worried About Running Out of Food in the Last Year: Sometimes true    Ran Out of Food in the Last Year: Often true  Transportation Needs: No Transportation Needs (05/15/2023)   Received from [facility] - Transportation    In the past 12 months, has lack of transportation kept you from medical appointments or from getting medications?: No    Lack of Transportation (Non-Medical): No  Physical Activity: Not on file  Stress: Not on file  Social Connections: Not on file  Intimate Partner Violence: Not on file     FAMILY HISTORY:  Family History  Problem Relation Age of Onset   Heart disease Mother    Hypertension Mother    Diabetes Mother    Diabetes Sister    Diabetes Brother     Otherwise negative.   REVIEW OF SYSTEMS:  Review of Systems  Constitutional:  Negative for chills and fever.  HENT:  Negative for sore throat.   Respiratory:  Negative for cough and shortness of breath.   Cardiovascular:  Negative for chest pain and palpitations.  Gastrointestinal:  Positive for abdominal pain and nausea. Negative for constipation, diarrhea and vomiting.  Genitourinary:  Negative for dysuria and urgency.  All other systems reviewed and are negative.   VITAL SIGNS:  Temp:  [97.8 F (36.6 C)] 97.8 F (36.6 C) (04/30 0840) Pulse Rate:  [106] 106 (04/30  0840) Resp:  [20] 20 (04/30 0840) BP: (128)/(98) 128/98 (04/30 0840) SpO2:  [98 %] 98 % (04/30 0840)             PHYSICAL EXAM:  Physical Exam Vitals and nursing note reviewed. Exam conducted with a chaperone present.  Constitutional:      General: He is not in acute distress.    Appearance: He is well-developed. He is not ill-appearing.     Comments: Well appearing, NAD. Wife at bedside.   HENT:     Head: Normocephalic and atraumatic.  Eyes:     General: No scleral icterus.    Extraocular Movements: Extraocular movements intact.  Cardiovascular:     Rate and Rhythm: Normal rate.     Heart sounds: Normal heart sounds. No murmur heard. Pulmonary:     Effort: Pulmonary effort is normal. No  respiratory distress.  Abdominal:     General: Abdomen is protuberant. There is distension.     Palpations: Abdomen is soft.     Tenderness: There is no abdominal tenderness. There is no guarding or rebound.     Comments: Abdomen is soft, distended, non-tender, no rebound/guarding. He is certainly not peritonitic.   Genitourinary:    Comments: Deferred Skin:    General: Skin is warm and dry.  Neurological:     General: No focal deficit present.     Mental Status: He is alert and oriented to person, place, and time.  Psychiatric:        Mood and Affect: Mood normal.        Behavior: Behavior normal.     INTAKE/OUTPUT:  This shift: No intake/output data recorded.  Last 2 shifts: @IOLAST2SHIFTS @  Labs:     Latest Ref Rng & Units 08/12/2023    8:57 AM 07/24/2021    3:31 AM 07/23/2021    4:20 AM  CBC  WBC 4.0 - 10.5 K/uL 13.4  5.8  6.9   Hemoglobin 13.0 - 17.0 g/dL 16.1  09.6  04.5   Hematocrit 39.0 - 52.0 % 50.3  41.1  46.1   Platelets 150 - 400 K/uL 233  198  205       Latest Ref Rng & Units 08/12/2023    8:57 AM 07/24/2021    3:31 AM 07/23/2021    4:20 AM  CMP  Glucose 70 - 99 mg/dL 409  811  914   BUN 6 - 20 mg/dL 14  12  15    Creatinine 0.61 - 1.24 mg/dL 7.82  9.56  2.13    Sodium 135 - 145 mmol/L 131  137  136   Potassium 3.5 - 5.1 mmol/L 4.8  3.6  3.9   Chloride 98 - 111 mmol/L 97  104  99   CO2 22 - 32 mmol/L 24  28  29    Calcium  8.9 - 10.3 mg/dL 9.4  8.3  8.3   Total Protein 6.5 - 8.1 g/dL 7.7  6.3    Total Bilirubin 0.0 - 1.2 mg/dL 1.2  0.9    Alkaline Phos 38 - 126 U/L 47  36    AST 15 - 41 U/L 54  35    ALT 0 - 44 U/L 68  49      Imaging studies:   CT Abdomen/Pelvis (08/12/2023) personally reviewed with dilation of small bowel, no free air, no pneumatosis, similar to previous exams in 2021 and 2023, and radiologist report reviewed below:   IMPRESSION: 1. Findings most consistent with a partial or early small-bowel obstruction with transition at the level of the distal ileal anastomosis in the right lower quadrant. 2.  Aortic Atherosclerosis (ICD10-I70.0).   Assessment/Plan: (ICD-10's: K50.609) 63 y.o. male with recurrent SBO with transition near previous small bowel anastomosis similar to presentation in 2021 and 2023 for the same.    - Admit to general surgery - Given lack of emesis, we will hold on NGT decompression for now. He understands we will place this should emesis occur.  - No need for emergent surgical intervention.    - NPO; okay for sips with medications, ice chips, sips of water  - IVF Resuscitation - Monitor abdominal examination; on-going bowel function - Serial KUBs; ordered for AM - Pain control prn; antiemetics prn - Mobilization as tolerated   - SSI ordered   - DVT prophylaxis  All of the above findings and  recommendations were discussed with the patient and his family (wife at bedside), and all of their questions were answered to their expressed satisfaction.  -- Apolonio Bay, PA-C [facility] 08/12/2023, 11:07 AM M-F: 7am - 4pm

## 2023-08-12 NOTE — ED Provider Notes (Signed)
 [facility] Provider Note    Event Date/Time   First MD Initiated Contact with Patient 08/12/23 [phone]     (approximate)   History   Abdominal Pain   HPI  Scott [name] is a 63 year old male with history of multiple prior abdominal surgeries as well as SBO presenting to the emergency department for evaluation of abdominal pain.  Some decreased appetite over the past few days.  Started to dry heaves last night with 1 episode of vomiting this morning.  Wife reports this is similar to when patient has had bowel obstructions in the past.  No fevers.     Physical Exam   Triage Vital Signs: ED Triage Vitals [08/12/23 0840]  Encounter Vitals Group     BP (!) 128/98     Systolic BP Percentile      Diastolic BP Percentile      Pulse Rate (!) 106     Resp 20     Temp 97.8 F (36.6 C)     Temp Source Oral     SpO2 98 %     Weight      Height      Head Circumference      Peak Flow      Pain Score 5     Pain Loc      Pain Education      Exclude from Growth Chart     Most recent vital signs: Vitals:   08/12/23 0840  BP: (!) 128/98  Pulse: (!) 106  Resp: 20  Temp: 97.8 F (36.6 C)  SpO2: 98%     General: Awake, interactive  CV:  Regular rate, good peripheral perfusion.  Resp:  Unlabored respirations. Abd:  Mild fullness but not tense, mild generalized tenderness Neuro:  Symmetric facial movement, fluid speech   ED Results / Procedures / Treatments   Labs (all labs ordered are listed, but only abnormal results are displayed) Labs Reviewed  COMPREHENSIVE METABOLIC PANEL WITH GFR - Abnormal; Notable for the following components:      Result Value   Sodium 131 (*)    Chloride 97 (*)    Glucose, Bld 167 (*)    AST 54 (*)    ALT 68 (*)    All other components within normal limits  CBC - Abnormal; Notable for the following components:   WBC 13.4 (*)    Hemoglobin 17.2 (*)    All other components within normal limits  LIPASE, BLOOD   URINALYSIS, ROUTINE W REFLEX MICROSCOPIC     EKG EKG independently reviewed interpreted by myself (ER attending) demonstrates:    RADIOLOGY Imaging independently reviewed and interpreted by myself demonstrates:  CXR without focal consolidation CT abdomen pelvis concerning for recurrent SBO  Formal Radiology Read:  CT ABDOMEN PELVIS W CONTRAST Result Date: 08/12/2023 CLINICAL DATA:  Concern for bowel obstruction. EXAM: CT ABDOMEN AND PELVIS WITH CONTRAST TECHNIQUE: Multidetector CT imaging of the abdomen and pelvis was performed using the standard protocol following bolus administration of intravenous contrast. RADIATION DOSE REDUCTION: This exam was performed according to the departmental dose-optimization program which includes automated exposure control, adjustment of the mA and/or kV according to patient size and/or use of iterative reconstruction technique. CONTRAST:  OMNIPAQUE  IOHEXOL  300 MG/ML  SOLN COMPARISON:  CT abdomen pelvis dated 07/22/2021. FINDINGS: Lower chest: The visualized lung bases are clear. There is coronary vascular calcification. No intra-abdominal free air or free fluid. Hepatobiliary: Fatty liver. Cholecystectomy. Mild dilatation of  the CBD, post cholecystectomy. No retained calcified stone noted in the central CBD. Pancreas: Unremarkable. No pancreatic ductal dilatation or surrounding inflammatory changes. Spleen: Normal in size without focal abnormality. Adrenals/Urinary Tract: The adrenal glands are unremarkable. There is a 5 cm left renal upper pole cyst. There is no hydronephrosis on either side. There is symmetric enhancement and excretion of contrast by both kidneys. The visualized ureters and urinary bladder appear unremarkable. Stomach/Bowel: There is postsurgical changes of the bowel with ileo ileal anastomosis in the right lower quadrant. Top-normal caliber small bowel loops in the lower abdomen measure up to 2.9 cm. There is mild thickened appearance of  these loops of small bowel with mild engorgement of the associated mesentery. A transition noted at the anastomosis. Although this may represent enteritis with associated ileus, findings concerning for a degree of obstruction. There is moderate stool throughout the colon. The appendix is normal. Vascular/Lymphatic: Mild aortoiliac atherosclerotic disease. The IVC is unremarkable. No portal venous gas. There is no adenopathy. Reproductive: The prostate and seminal vesicles are grossly unremarkable. No pelvic mass. Other: None Musculoskeletal: Degenerative changes of the spine. No acute osseous pathology. IMPRESSION: 1. Findings most consistent with a partial or early small-bowel obstruction with transition at the level of the distal ileal anastomosis in the right lower quadrant. 2.  Aortic Atherosclerosis (ICD10-I70.0). Electronically Signed   By: Angus Bark M.D.   On: 08/12/2023 10:53   DG Chest 2 View Result Date: 08/12/2023 CLINICAL DATA:  Cough. EXAM: CHEST - 2 VIEW COMPARISON:  February 07, 2016. FINDINGS: The heart size and mediastinal contours are within normal limits. Both lungs are clear. The visualized skeletal structures are unremarkable. IMPRESSION: No active cardiopulmonary disease. Electronically Signed   By: Rosalene Colon M.D.   On: 08/12/2023 09:26    PROCEDURES:  Critical Care performed: No  Procedures   MEDICATIONS ORDERED IN ED: Medications  iohexol  (OMNIPAQUE ) 300 MG/ML solution 100 mL (100 mLs Intravenous Contrast Given 08/12/23 1016)     IMPRESSION / MDM / ASSESSMENT AND PLAN / ED COURSE  I reviewed the triage vital signs and the nursing notes.  Differential diagnosis includes, but is not limited to, recurrent SBO, pneumonia, appendicitis, colitis, other acute intra-abdominal process  Patient's presentation is most consistent with acute presentation with potential threat to life or bodily function.  63 year old male presenting to the emergency department for  evaluation of abdominal pain.  Abdominal fullness on exam, concern for recurrent bowel obstruction.  Labs with mild leukocytosis at 13.4.  Mild transaminitis.  Chest x-Scott [name] reassuring.  CT abdomen pelvis pending.   Clinical Course as of 08/12/23 1121  Wed Aug 12, 2023  1106 CT demonstrates early or partial SBO prior anastomotic site.  Case discussed with Zach Schultz with general surgery.  He will evaluate patient for anticipated admission. [NR]    Clinical Course User Index [NR] Claria Crofts, MD     FINAL CLINICAL IMPRESSION(S) / ED DIAGNOSES   Final diagnoses:  Small bowel obstruction (HCC)     Rx / DC Orders   ED Discharge Orders     None        Note:  This document was prepared using Dragon voice recognition software and may include unintentional dictation errors.   Claria Crofts, MD 08/12/23 [phone]

## 2023-08-12 NOTE — TOC CM/SW Note (Signed)
 Transition of Care [facility]) - Inpatient Brief Assessment   Patient Details  Name: Scott [name] MRN: [id number] Date of Birth: 1960-09-21  Transition of Care [facility]) CM/SW Contact:    Loman Risk, RN Phone Number: 08/12/2023, 1:03 PM   Clinical Narrative:  Transition of Care [facility]) Screening Note   Patient Details  Name: Scott [name] Date of Birth: 22-Dec-1960   Transition of Care [facility]) CM/SW Contact:    Loman Risk, RN Phone Number: 08/12/2023, 1:03 PM    Transition of Care Department [facility]) has reviewed patient and no TOC needs have been identified at this time.  If new patient transition needs arise, please place a TOC consult.     Transition of Care Asessment: Insurance and Status: Insurance coverage has been reviewed Patient has primary care physician: Yes     Prior/Current Home Services: No current home services Social Drivers of Health Review: SDOH reviewed no interventions necessary Readmission risk has been reviewed: Yes Transition of care needs: no transition of care needs at this time

## 2023-08-13 ENCOUNTER — Inpatient Hospital Stay

## 2023-08-13 DIAGNOSIS — K56609 Unspecified intestinal obstruction, unspecified as to partial versus complete obstruction: Secondary | ICD-10-CM | POA: Diagnosis not present

## 2023-08-13 LAB — CBC
HCT: 42.5 % (ref 39.0–52.0)
Hemoglobin: 14.9 g/dL (ref 13.0–17.0)
MCH: 30.9 pg (ref 26.0–34.0)
MCHC: 35.1 g/dL (ref 30.0–36.0)
MCV: 88.2 fL (ref 80.0–100.0)
Platelets: 211 10*3/uL (ref 150–400)
RBC: 4.82 MIL/uL (ref 4.22–5.81)
RDW: 12.4 % (ref 11.5–15.5)
WBC: 8.3 10*3/uL (ref 4.0–10.5)
nRBC: 0 % (ref 0.0–0.2)

## 2023-08-13 LAB — COMPREHENSIVE METABOLIC PANEL WITH GFR
ALT: 49 U/L — ABNORMAL HIGH (ref 0–44)
AST: 33 U/L (ref 15–41)
Albumin: 3.8 g/dL (ref 3.5–5.0)
Alkaline Phosphatase: 39 U/L (ref 38–126)
Anion gap: 8 (ref 5–15)
BUN: 13 mg/dL (ref 6–20)
CO2: 23 mmol/L (ref 22–32)
Calcium: 8.3 mg/dL — ABNORMAL LOW (ref 8.9–10.3)
Chloride: 102 mmol/L (ref 98–111)
Creatinine, Ser: 0.83 mg/dL (ref 0.61–1.24)
GFR, Estimated: >60 mL/min (ref >60)
Glucose, Bld: 146 mg/dL — ABNORMAL HIGH (ref 70–99)
Potassium: 4 mmol/L (ref 3.5–5.1)
Sodium: 133 mmol/L — ABNORMAL LOW (ref 135–145)
Total Bilirubin: 1.2 mg/dL (ref 0.0–1.2)
Total Protein: 6.7 g/dL (ref 6.5–8.1)

## 2023-08-13 LAB — GLUCOSE, CAPILLARY
Glucose-Capillary: 121 mg/dL — ABNORMAL HIGH (ref 70–99)
Glucose-Capillary: 107 mg/dL — ABNORMAL HIGH (ref 70–99)
Glucose-Capillary: 111 mg/dL — ABNORMAL HIGH (ref 70–99)
Glucose-Capillary: 93 mg/dL (ref 70–99)

## 2023-08-13 LAB — MAGNESIUM: Magnesium: 2.2 mg/dL (ref 1.7–2.4)

## 2023-08-13 LAB — PHOSPHORUS: Phosphorus: 3.1 mg/dL (ref 2.5–4.6)

## 2023-08-13 MED ORDER — TRIAMCINOLONE 0.1 % CREAM:EUCERIN CREAM 1:1
TOPICAL_CREAM | Freq: Three times a day (TID) | CUTANEOUS | Status: DC | PRN
  Filled 2023-08-13 (×2): qty 1

## 2023-08-13 MED ORDER — DIPHENHYDRAMINE HCL 50 MG/ML IJ SOLN
12.5000 mg | Freq: Three times a day (TID) | INTRAMUSCULAR | Status: DC | PRN
  Administered 2023-08-13 – 2023-08-15 (×3): 12.5 mg via INTRAVENOUS
  Filled 2023-08-13 (×3): qty 1

## 2023-08-13 MED ORDER — SODIUM CHLORIDE 0.9 % IV SOLN: INTRAVENOUS | Status: AC

## 2023-08-13 MED ORDER — DIPHENHYDRAMINE HCL 25 MG PO CAPS: 25.0000 mg | ORAL_CAPSULE | Freq: Four times a day (QID) | ORAL | Status: DC | PRN

## 2023-08-13 MED ORDER — DIPHENHYDRAMINE HCL 50 MG/ML IJ SOLN: 12.5000 mg | Freq: Three times a day (TID) | INTRAMUSCULAR | Status: DC | PRN

## 2023-08-13 MED ORDER — DIPHENHYDRAMINE HCL 25 MG PO CAPS
25.0000 mg | ORAL_CAPSULE | Freq: Four times a day (QID) | ORAL | Status: DC | PRN
  Administered 2023-08-13 – 2023-08-18 (×5): 25 mg via ORAL
  Filled 2023-08-13 (×5): qty 1

## 2023-08-13 NOTE — Plan of Care (Signed)
   Problem: Education: Goal: Ability to describe self-care measures that may prevent or decrease complications (Diabetes Survival Skills Education) will improve Outcome: Progressing Goal: Individualized Educational Video(s) Outcome: Progressing   Problem: Coping: Goal: Ability to adjust to condition or change in health will improve Outcome: Progressing

## 2023-08-13 NOTE — Progress Notes (Addendum)
 Pt woke up confused early in the AM, pulled IV connector out and blood was everywhere. Pt labs done early to assess Hemoglobin. Result showed Hem 14.9 down from 17.2 taken on 4/30. MD on call made aware.

## 2023-08-13 NOTE — Progress Notes (Signed)
 [facility] SURGICAL PROGRESS NOTE (cpt [phone])  Hospital Day(s): 1.   Post op day(s):  Scott [name]   Interval History: Patient seen and examined, had a moment of delirium during the night where he awoke and lost it.  Reportedly pulled out his IV, had blood all over the place,, with complaints of itching.  Since that time has been reoriented.  No new complaints this morning. Patient reports an appetite, minimal flatus, denies pain, nausea or vomiting..  Review of Systems:  Constitutional: denies fever, chills  HEENT: denies cough or congestion  Respiratory: denies any shortness of breath  Cardiovascular: denies chest pain or palpitations  Gastrointestinal: denies abdominal pain, N/V, or diarrhea/and bowel function limited to minimal flatus at this point. Genitourinary: denies burning with urination or urinary frequency Musculoskeletal: denies pain, decreased motor or sensation Integumentary: denies any other rashes or skin discolorations, except for itchy dry skin of his lower abdomen, and forearms Neurological: denies HA or vision/hearing changes   Vital signs in last 24 hours: [min-max] current  Temp:  [97.6 F (36.4 C)-98.5 F (36.9 C)] 97.8 F (36.6 C) (05/01 0808) Pulse Rate:  [86-93] 88 (05/01 0808) Resp:  [15-20] 16 (05/01 0808) BP: (105-118)/(70-88) 115/84 (05/01 0808) SpO2:  [94 %-99 %] 97 % (05/01 0808) Weight:  [68.3 kg-69.4 kg] 69.4 kg (05/01 0720)     Height: 5\' 7"  (170.2 cm) Weight: 69.4 kg BMI (Calculated): 23.96   Intake/Output last 2 shifts:  04/30 0701 - 05/01 0700 In: -  Out: 600 [Urine:600]   Physical Exam:  Constitutional: alert, cooperative and no distress  HENT: normocephalic without obvious abnormality  Respiratory: breathing non-labored at rest  Cardiovascular: regular rate and sinus rhythm  Gastrointestinal: soft, non-tender, and non-distended Musculoskeletal: UE and LE FROM, no edema or wounds, motor and sensation grossly intact, NT     Labs:     Latest Ref Rng & Units 08/13/2023    1:45 AM 08/12/2023    8:57 AM 07/24/2021    3:31 AM  CBC  WBC 4.0 - 10.5 K/uL 8.3  13.4  5.8   Hemoglobin 13.0 - 17.0 g/dL 96.2  95.2  84.1   Hematocrit 39.0 - 52.0 % 42.5  50.3  41.1   Platelets 150 - 400 K/uL 211  233  198       Latest Ref Rng & Units 08/13/2023    1:45 AM 08/12/2023    8:57 AM 07/24/2021    3:31 AM  CMP  Glucose 70 - 99 mg/dL 324  401  027   BUN 6 - 20 mg/dL 13  14  12    Creatinine 0.61 - 1.24 mg/dL 2.53  6.64  4.03   Sodium 135 - 145 mmol/L 133  131  137   Potassium 3.5 - 5.1 mmol/L 4.0  4.8  3.6   Chloride 98 - 111 mmol/L 102  97  104   CO2 22 - 32 mmol/L 23  24  28    Calcium  8.9 - 10.3 mg/dL 8.3  9.4  8.3   Total Protein 6.5 - 8.1 g/dL 6.7  7.7  6.3   Total Bilirubin 0.0 - 1.2 mg/dL 1.2  1.2  0.9   Alkaline Phos 38 - 126 U/L 39  47  36   AST 15 - 41 U/L 33  54  35   ALT 0 - 44 U/L 49  68  49      Imaging studies: CLINICAL DATA:  Small bowel obstruction.   EXAM: DG  ABDOMEN ACUTE WITH 1 VIEW CHEST   COMPARISON:  Abdomen and pelvis CT 08/12/2023. Chest x-ray 08/12/2023.   FINDINGS: The lungs are clear without focal pneumonia, edema, pneumothorax or pleural effusion. The cardiopericardial silhouette is within normal limits for size. No acute bony abnormality.   Upright film shows no evidence for intraperitoneal free air. Dilated gas-filled small bowel loops are identified in the left and central abdomen measuring up to 3.6 cm diameter. Gas is visible in the right and transverse colon which is nondilated. Surgical clips in the right upper quadrant suggest prior cholecystectomy.   IMPRESSION: 1. Dilated gas-filled small bowel loops in the left and central abdomen measuring up to 3.6 cm diameter. Imaging features compatible with small bowel obstruction. 2. No evidence for intraperitoneal free air.     Electronically Signed   By: Donnal Fusi M.D.   On: 08/13/2023 07:00   Assessment/Plan:   63 y.o. male with partial small bowel obstruction   s/p remote small bowel resection, complicated by pertinent comorbidities including: Patient Active Problem List   Diagnosis Date Noted   History of resection of small bowel 03/10/2020   DM type 2 with diabetic mixed hyperlipidemia (HCC) 05/27/2019   Benign essential hypertension 05/27/2019   Diabetes mellitus without complication (HCC) 02/17/2019   Pain due to onychomycosis of toenails of both feet 02/17/2019   Injury of radial artery, left, subsequent encounter 07/28/2018   Small bowel obstruction (HCC) 02/05/2016   Hypertension 10/29/2015   Controlled diabetes mellitus type II without complication (HCC) 10/29/2015   Onychomycosis 10/29/2015   SBO (small bowel obstruction) (HCC) 09/27/2015     - Considering trial of clear liquids.  Patient desires to give it ago.  - Triamcinolone  for itchy areas in addition to Benadryl  ordered last night.  - Continue IV fluid support until confirming oral tolerance.  - Repeat KUB in AM.  All of the above findings and recommendations were discussed with the patient, and all of patient's questions were answered to his expressed satisfaction.   -- Flynn Hylan M.D., [facility] 08/13/2023 11:15 AM

## 2023-08-14 ENCOUNTER — Inpatient Hospital Stay

## 2023-08-14 DIAGNOSIS — K56609 Unspecified intestinal obstruction, unspecified as to partial versus complete obstruction: Secondary | ICD-10-CM | POA: Diagnosis not present

## 2023-08-14 LAB — GLUCOSE, CAPILLARY
Glucose-Capillary: 97 mg/dL (ref 70–99)
Glucose-Capillary: 123 mg/dL — ABNORMAL HIGH (ref 70–99)
Glucose-Capillary: 73 mg/dL (ref 70–99)
Glucose-Capillary: 127 mg/dL — ABNORMAL HIGH (ref 70–99)

## 2023-08-14 MED ORDER — DONEPEZIL HCL 5 MG PO TABS
10.0000 mg | ORAL_TABLET | Freq: Every day | ORAL | Status: DC
Start: 2023-08-14 — End: 2023-08-17
  Administered 2023-08-14 – 2023-08-16 (×3): 10 mg via ORAL
  Filled 2023-08-14 (×3): qty 2

## 2023-08-14 MED ORDER — ASPIRIN 81 MG PO CHEW
81.0000 mg | CHEWABLE_TABLET | Freq: Every day | ORAL | Status: DC
  Administered 2023-08-14 – 2023-08-18 (×5): 81 mg via ORAL
  Filled 2023-08-14 (×5): qty 1

## 2023-08-14 MED ORDER — BUPROPION HCL ER (XL) 150 MG PO TB24
150.0000 mg | ORAL_TABLET | Freq: Every day | ORAL | Status: DC
  Administered 2023-08-14 – 2023-08-18 (×5): 150 mg via ORAL
  Filled 2023-08-14 (×5): qty 1

## 2023-08-14 NOTE — Progress Notes (Signed)
 [facility] SURGICAL PROGRESS NOTE (cpt [phone])  Hospital Day(s): 2.   Post op day(s):  Scott [name]   Interval History: Patient seen and examined, had a good night last night.  No complaints today, reports a better appetite, a couple bowel movements yesterday and passing flatus this morning.  He has shown evidence of tolerating clear liquid diet.  Though admits to a bit of distention.  He is used to this kind of thing, and would like to proceed to full liquids.  Review of Systems:  Constitutional: denies fever, chills  HEENT: denies cough or congestion  Respiratory: denies any shortness of breath  Cardiovascular: denies chest pain or palpitations  Gastrointestinal: denies abdominal pain, N/V, or diarrhea/and bowel function limited to minimal flatus at this point. Genitourinary: denies burning with urination or urinary frequency Musculoskeletal: denies pain, decreased motor or sensation Integumentary: denies any other rashes or skin discolorations, except for itchy dry skin of his lower abdomen, and forearms Neurological: denies HA or vision/hearing changes   Vital signs in last 24 hours: [min-max] current  Temp:  [97.6 F (36.4 C)-97.8 F (36.6 C)] 97.8 F (36.6 C) (05/02 0745) Pulse Rate:  [76-92] 76 (05/02 0745) Resp:  [14-18] 14 (05/02 0745) BP: (109-125)/(79-91) 109/84 (05/02 0745) SpO2:  [98 %-100 %] 100 % (05/02 0745)     Height: 5\' 7"  (170.2 cm) Weight: 69.4 kg BMI (Calculated): 23.96   Intake/Output last 2 shifts:  05/01 0701 - 05/02 0700 In: 2120.5 [P.O.:240; I.V.:1880.5] Out: 1350 [Urine:750]   Physical Exam:  Constitutional: alert, cooperative and no distress  HENT: normocephalic without obvious abnormality  Respiratory: breathing non-labored at rest  Cardiovascular: regular rate and sinus rhythm  Gastrointestinal: soft, non-tender, and mildly distended Musculoskeletal: Extremities well perfused, and w/o edema.     Labs:     Latest Ref Rng & Units  08/13/2023    1:45 AM 08/12/2023    8:57 AM 07/24/2021    3:31 AM  CBC  WBC 4.0 - 10.5 K/uL 8.3  13.4  5.8   Hemoglobin 13.0 - 17.0 g/dL 10.9  32.3  55.7   Hematocrit 39.0 - 52.0 % 42.5  50.3  41.1   Platelets 150 - 400 K/uL 211  233  198       Latest Ref Rng & Units 08/13/2023    1:45 AM 08/12/2023    8:57 AM 07/24/2021    3:31 AM  CMP  Glucose 70 - 99 mg/dL 322  025  427   BUN 6 - 20 mg/dL 13  14  12    Creatinine 0.61 - 1.24 mg/dL 0.62  3.76  2.83   Sodium 135 - 145 mmol/L 133  131  137   Potassium 3.5 - 5.1 mmol/L 4.0  4.8  3.6   Chloride 98 - 111 mmol/L 102  97  104   CO2 22 - 32 mmol/L 23  24  28    Calcium  8.9 - 10.3 mg/dL 8.3  9.4  8.3   Total Protein 6.5 - 8.1 g/dL 6.7  7.7  6.3   Total Bilirubin 0.0 - 1.2 mg/dL 1.2  1.2  0.9   Alkaline Phos 38 - 126 U/L 39  47  36   AST 15 - 41 U/L 33  54  35   ALT 0 - 44 U/L 49  68  49      Imaging studies: report pndg.  To my read, slightly less SB dilation proximally, with more distal colo-rectal gas.  Assessment/Plan:  63 y.o. male with partial small bowel obstruction   s/p remote small bowel resection, complicated by pertinent comorbidities including: Patient Active Problem List   Diagnosis Date Noted   History of resection of small bowel 03/10/2020   DM type 2 with diabetic mixed hyperlipidemia (HCC) 05/27/2019   Benign essential hypertension 05/27/2019   Diabetes mellitus without complication (HCC) 02/17/2019   Pain due to onychomycosis of toenails of both feet 02/17/2019   Injury of radial artery, left, subsequent encounter 07/28/2018   Small bowel obstruction (HCC) 02/05/2016   Hypertension 10/29/2015   Controlled diabetes mellitus type II without complication (HCC) 10/29/2015   Onychomycosis 10/29/2015   SBO (small bowel obstruction) (HCC) 09/27/2015     - Clearly tolerating clear liquid diet, will cautiously allow full liquid diet.   - Repeat KUB in AM.  All of the above findings and recommendations were  discussed with the patient, and all of patient's questions were answered to his expressed satisfaction.   -- Flynn Hylan M.D., [facility] 08/14/2023 9:15 AM

## 2023-08-15 LAB — GLUCOSE, CAPILLARY
Glucose-Capillary: 81 mg/dL (ref 70–99)
Glucose-Capillary: 154 mg/dL — ABNORMAL HIGH (ref 70–99)
Glucose-Capillary: 100 mg/dL — ABNORMAL HIGH (ref 70–99)
Glucose-Capillary: 152 mg/dL — ABNORMAL HIGH (ref 70–99)

## 2023-08-15 MED ORDER — ORAL CARE MOUTH RINSE: 15.0000 mL | OROMUCOSAL | Status: DC | PRN

## 2023-08-15 MED ORDER — ENSURE ENLIVE PO LIQD
237.0000 mL | Freq: Two times a day (BID) | ORAL | Status: DC
  Administered 2023-08-15 – 2023-08-18 (×7): 237 mL via ORAL

## 2023-08-15 NOTE — Progress Notes (Signed)
 Patient ID: Scott [name], male   DOB: 1960/10/16, 63 y.o.   MRN: [id number]     SURGICAL PROGRESS NOTE   Hospital Day(s): 3.   Interval History: Patient seen and examined, no acute events or new complaints overnight. Patient reports feeling okay.  He denies any worsening abdominal pain.  He is still feeling mild distended but endorsing that he is passing small amount of gas.  Denies any nausea.  No significant pain.  No pain radiation.  No alleviating or aggravating factors.  Vital signs in last 24 hours: [min-max] current  Temp:  [97.9 F (36.6 C)-98 F (36.7 C)] 98 F (36.7 C) (05/03 0838) Pulse Rate:  [76-88] 84 (05/03 0838) Resp:  [16] 16 (05/03 0838) BP: (122-127)/(78-89) 122/78 (05/03 0838) SpO2:  [98 %-99 %] 99 % (05/03 0838)     Height: 5\' 7"  (170.2 cm) Weight: 69.4 kg BMI (Calculated): 23.96   Physical Exam:  Constitutional: alert, cooperative and no distress  Respiratory: breathing non-labored at rest  Cardiovascular: regular rate and sinus rhythm  Gastrointestinal: soft, non-tender, bowel mild distended and tympanic  Labs:     Latest Ref Rng & Units 08/13/2023    1:45 AM 08/12/2023    8:57 AM 07/24/2021    3:31 AM  CBC  WBC 4.0 - 10.5 K/uL 8.3  13.4  5.8   Hemoglobin 13.0 - 17.0 g/dL 19.1  47.8  29.5   Hematocrit 39.0 - 52.0 % 42.5  50.3  41.1   Platelets 150 - 400 K/uL 211  233  198       Latest Ref Rng & Units 08/13/2023    1:45 AM 08/12/2023    8:57 AM 07/24/2021    3:31 AM  CMP  Glucose 70 - 99 mg/dL 621  308  657   BUN 6 - 20 mg/dL 13  14  12    Creatinine 0.61 - 1.24 mg/dL 8.46  9.62  9.52   Sodium 135 - 145 mmol/L 133  131  137   Potassium 3.5 - 5.1 mmol/L 4.0  4.8  3.6   Chloride 98 - 111 mmol/L 102  97  104   CO2 22 - 32 mmol/L 23  24  28    Calcium  8.9 - 10.3 mg/dL 8.3  9.4  8.3   Total Protein 6.5 - 8.1 g/dL 6.7  7.7  6.3   Total Bilirubin 0.0 - 1.2 mg/dL 1.2  1.2  0.9   Alkaline Phos 38 - 126 U/L 39  47  36   AST 15 - 41 U/L 33  54  35   ALT 0 -  44 U/L 49  68  49     Imaging studies: Abdominal x-ray yesterday showed persistent small bowel dilation.  I personally evaluated the images.   Assessment/Plan:  63 y.o. male with small bowel obstruction, complicated by pertinent comorbidities including diabetes, hypertension.  -Patient with stable vital signs - No clinical alteration - He endorsed that he is passing small amount of gas but he is still mildly bloated.  Physical exam with mild distention and tympanic - Since he is passing gas and worsening physical exam I will continue current diet without advancing diet until we have better GI function - Encouraged the patient to ambulate - Continue pain management  Lucila Rye, MD

## 2023-08-16 LAB — GLUCOSE, CAPILLARY
Glucose-Capillary: 119 mg/dL — ABNORMAL HIGH (ref 70–99)
Glucose-Capillary: 177 mg/dL — ABNORMAL HIGH (ref 70–99)
Glucose-Capillary: 156 mg/dL — ABNORMAL HIGH (ref 70–99)
Glucose-Capillary: 135 mg/dL — ABNORMAL HIGH (ref 70–99)

## 2023-08-16 NOTE — Progress Notes (Signed)
 Patient ID: Scott [name], male   DOB: 14-Jan-1961, 63 y.o.   MRN: [id number]     SURGICAL PROGRESS NOTE   Hospital Day(s): 4.   Interval History: Patient seen and examined, no acute events or new complaints overnight. Patient reports feeling okay but he endorses that he had an episode of vomiting last night.  He does not feel bloated and he feels that he is passing small amount of gas.  Denies any significant pain.  He did feel uncomfortable with his nausea yesterday.  Vital signs in last 24 hours: [min-max] current  Temp:  [97.5 F (36.4 C)-98.2 F (36.8 C)] 98 F (36.7 C) (05/04 0804) Pulse Rate:  [74-98] 74 (05/04 0804) Resp:  [16-20] 18 (05/04 0804) BP: (121-134)/(91-99) 122/91 (05/04 0804) SpO2:  [97 %-100 %] 100 % (05/04 0804)     Height: 5\' 7"  (170.2 cm) Weight: 69.4 kg BMI (Calculated): 23.96   Physical Exam:  Constitutional: alert, cooperative and no distress  Respiratory: breathing non-labored at rest  Cardiovascular: regular rate and sinus rhythm  Gastrointestinal: soft, non-tender, but mildly-distended and tympanic  Labs:     Latest Ref Rng & Units 08/13/2023    1:45 AM 08/12/2023    8:57 AM 07/24/2021    3:31 AM  CBC  WBC 4.0 - 10.5 K/uL 8.3  13.4  5.8   Hemoglobin 13.0 - 17.0 g/dL 21.3  08.6  57.8   Hematocrit 39.0 - 52.0 % 42.5  50.3  41.1   Platelets 150 - 400 K/uL 211  233  198       Latest Ref Rng & Units 08/13/2023    1:45 AM 08/12/2023    8:57 AM 07/24/2021    3:31 AM  CMP  Glucose 70 - 99 mg/dL 469  629  528   BUN 6 - 20 mg/dL 13  14  12    Creatinine 0.61 - 1.24 mg/dL 4.13  2.44  0.10   Sodium 135 - 145 mmol/L 133  131  137   Potassium 3.5 - 5.1 mmol/L 4.0  4.8  3.6   Chloride 98 - 111 mmol/L 102  97  104   CO2 22 - 32 mmol/L 23  24  28    Calcium  8.9 - 10.3 mg/dL 8.3  9.4  8.3   Total Protein 6.5 - 8.1 g/dL 6.7  7.7  6.3   Total Bilirubin 0.0 - 1.2 mg/dL 1.2  1.2  0.9   Alkaline Phos 38 - 126 U/L 39  47  36   AST 15 - 41 U/L 33  54  35   ALT 0 - 44  U/L 49  68  49     Imaging studies: No new pertinent imaging studies   Assessment/Plan:  63 y.o. male with small bowel obstruction, complicated by pertinent comorbidities including diabetes, hypertension.   -Patient with stable vital signs - He endorsed that he is passing small amount of gas but he is still mildly bloated.  Physical exam with mild distention and tympanic - Since he had an episode of vomiting last night and still mildly distended but passing small amount of gas will downgrade diet to clear liquid - Encouraged the patient to ambulate - Continue pain management  Lucila Rye, MD

## 2023-08-17 ENCOUNTER — Inpatient Hospital Stay

## 2023-08-17 DIAGNOSIS — K56609 Unspecified intestinal obstruction, unspecified as to partial versus complete obstruction: Secondary | ICD-10-CM | POA: Diagnosis not present

## 2023-08-17 LAB — CBC
HCT: 48.5 % (ref 39.0–52.0)
Hemoglobin: 16.3 g/dL (ref 13.0–17.0)
MCH: 30.5 pg (ref 26.0–34.0)
MCHC: 33.6 g/dL (ref 30.0–36.0)
MCV: 90.7 fL (ref 80.0–100.0)
Platelets: 235 10*3/uL (ref 150–400)
RBC: 5.35 MIL/uL (ref 4.22–5.81)
RDW: 12.9 % (ref 11.5–15.5)
WBC: 5.9 10*3/uL (ref 4.0–10.5)
nRBC: 0 % (ref 0.0–0.2)

## 2023-08-17 LAB — BASIC METABOLIC PANEL WITH GFR
Anion gap: 9 (ref 5–15)
BUN: 8 mg/dL (ref 6–20)
CO2: 27 mmol/L (ref 22–32)
Calcium: 9.2 mg/dL (ref 8.9–10.3)
Chloride: 100 mmol/L (ref 98–111)
Creatinine, Ser: 0.93 mg/dL (ref 0.61–1.24)
GFR, Estimated: >60 mL/min (ref >60)
Glucose, Bld: 137 mg/dL — ABNORMAL HIGH (ref 70–99)
Potassium: 4 mmol/L (ref 3.5–5.1)
Sodium: 136 mmol/L (ref 135–145)

## 2023-08-17 LAB — GLUCOSE, CAPILLARY
Glucose-Capillary: 129 mg/dL — ABNORMAL HIGH (ref 70–99)
Glucose-Capillary: 160 mg/dL — ABNORMAL HIGH (ref 70–99)
Glucose-Capillary: 173 mg/dL — ABNORMAL HIGH (ref 70–99)
Glucose-Capillary: 119 mg/dL — ABNORMAL HIGH (ref 70–99)

## 2023-08-17 MED ORDER — RIVASTIGMINE TARTRATE 3 MG PO CAPS
3.0000 mg | ORAL_CAPSULE | Freq: Two times a day (BID) | ORAL | Status: DC
  Administered 2023-08-17 – 2023-08-18 (×2): 3 mg via ORAL
  Filled 2023-08-17 (×2): qty 1

## 2023-08-17 NOTE — Progress Notes (Signed)
 [facility] SURGICAL PROGRESS NOTE (cpt [phone])  Hospital Day(s): 5.   Post op day(s):  Scott [name]   Interval History: Patient seen and examined, had a good night last night.  No complaints today, denies n/v, or abd pain, a couple bowel movements yesterday and passing flatus this morning.  He has shown evidence of tolerating clear liquid diet.   He is used to this kind of thing, and would like to resume trial of full liquids, however that seemed to worsen him over the weekend, vomiting the tomato soup.  Review of Systems:  Constitutional: denies fever, chills  HEENT: denies cough or congestion  Respiratory: denies any shortness of breath  Cardiovascular: denies chest pain or palpitations  Gastrointestinal: denies abdominal pain, N/V, or diarrhea/and bowel function limited to minimal flatus at this point. Genitourinary: denies burning with urination or urinary frequency Musculoskeletal: denies pain, decreased motor or sensation Integumentary: denies any other rashes or skin discolorations, except for itchy dry skin of his lower abdomen, and forearms Neurological: denies HA or vision/hearing changes   Vital signs in last 24 hours: [min-max] current  Temp:  [97.7 F (36.5 C)-97.9 F (36.6 C)] 97.7 F (36.5 C) (05/05 0331) Pulse Rate:  [71-91] 71 (05/05 0331) Resp:  [17-18] 18 (05/05 0331) BP: (113-119)/(79-85) 119/85 (05/05 0331) SpO2:  [97 %-100 %] 97 % (05/05 0331)     Height: 5\' 7"  (170.2 cm) Weight: 69.4 kg BMI (Calculated): 23.96   Intake/Output last 2 shifts:  05/04 0701 - 05/05 0700 In: 960 [P.O.:960] Out: 1250 [Urine:1250]   Physical Exam:  Constitutional: alert, cooperative and no distress  HENT: normocephalic without obvious abnormality  Respiratory: breathing non-labored at rest  Cardiovascular: regular rate and sinus rhythm  Gastrointestinal: soft, non-tender, baseline distended Musculoskeletal: Extremities well perfused, and w/o edema.     Labs:      Latest Ref Rng & Units 08/13/2023    1:45 AM 08/12/2023    8:57 AM 07/24/2021    3:31 AM  CBC  WBC 4.0 - 10.5 K/uL 8.3  13.4  5.8   Hemoglobin 13.0 - 17.0 g/dL 60.4  54.0  98.1   Hematocrit 39.0 - 52.0 % 42.5  50.3  41.1   Platelets 150 - 400 K/uL 211  233  198       Latest Ref Rng & Units 08/13/2023    1:45 AM 08/12/2023    8:57 AM 07/24/2021    3:31 AM  CMP  Glucose 70 - 99 mg/dL 191  478  295   BUN 6 - 20 mg/dL 13  14  12    Creatinine 0.61 - 1.24 mg/dL 6.21  3.08  6.57   Sodium 135 - 145 mmol/L 133  131  137   Potassium 3.5 - 5.1 mmol/L 4.0  4.8  3.6   Chloride 98 - 111 mmol/L 102  97  104   CO2 22 - 32 mmol/L 23  24  28    Calcium  8.9 - 10.3 mg/dL 8.3  9.4  8.3   Total Protein 6.5 - 8.1 g/dL 6.7  7.7  6.3   Total Bilirubin 0.0 - 1.2 mg/dL 1.2  1.2  0.9   Alkaline Phos 38 - 126 U/L 39  47  36   AST 15 - 41 U/L 33  54  35   ALT 0 - 44 U/L 49  68  49       Assessment/Plan:  63 y.o. male with partial small bowel obstruction   s/p remote  small bowel resection, complicated by pertinent comorbidities including: Patient Active Problem List   Diagnosis Date Noted   History of resection of small bowel 03/10/2020   DM type 2 with diabetic mixed hyperlipidemia (HCC) 05/27/2019   Benign essential hypertension 05/27/2019   Diabetes mellitus without complication (HCC) 02/17/2019   Pain due to onychomycosis of toenails of both feet 02/17/2019   Injury of radial artery, left, subsequent encounter 07/28/2018   Small bowel obstruction (HCC) 02/05/2016   Hypertension 10/29/2015   Controlled diabetes mellitus type II without complication (HCC) 10/29/2015   Onychomycosis 10/29/2015   SBO (small bowel obstruction) (HCC) 09/27/2015     - Clearly tolerating clear liquid diet, before progressing to full liquid diet, will obtain SBFT today.    - Repeat Labs today.   All of the above findings and recommendations were discussed with the patient, and all of patient's questions were answered to his  expressed satisfaction.   -- Flynn Hylan M.D., FACS 08/17/2023 8:30 AM

## 2023-08-18 LAB — GLUCOSE, CAPILLARY
Glucose-Capillary: 125 mg/dL — ABNORMAL HIGH (ref 70–99)
Glucose-Capillary: 103 mg/dL — ABNORMAL HIGH (ref 70–99)

## 2023-08-18 NOTE — Discharge Summary (Signed)
 Physician Discharge Summary  Patient ID: Scott [name] MRN: [id number] DOB/AGE: Apr 17, 1960 63 y.o.  Admit date: 08/12/2023 Discharge date: 08/18/2023  Admission Diagnoses:  Discharge Diagnoses:  Principal Problem:   SBO (small bowel obstruction) [facility])   Discharged Condition: good  Hospital Course: Bowel rest, attempts at progression beyond CLD, halted for various reasons.  Eczema with uncertain etiology treated with topical triamcinolone .  SBFT confirmed absence of SBO, tolerating diet.   Consults: None  Significant Diagnostic Studies: radiology:  SBFT did not show evidence of SBO, or fixed stenotic area.   Treatments: IV hydration, and bowel rest, with gradual resumption of diet.   Discharge Exam: Blood pressure (!) 125/91, pulse 78, temperature 97.7 F (36.5 C), resp. rate 16, height 5\' 7"  (1.702 m), weight 70.1 kg, SpO2 100%. GI: soft, non-tender; bowel sounds normal; no masses,  no organomegaly, mild baseline tenderness.   Disposition: Discharge disposition: 01-Home or Self Care       Discharge Instructions     Call MD for:  persistant nausea and vomiting   Complete by: As directed    Call MD for:  redness, tenderness, or signs of infection (pain, swelling, redness, odor or green/yellow discharge around incision site)   Complete by: As directed    Call MD for:  severe uncontrolled pain   Complete by: As directed    Diet - low sodium heart healthy   Complete by: As directed    Carb modified.   Driving Restrictions   Complete by: As directed    No driving until cleared after follow-up appointment.  Is not advised to drive while taking narcotic pain medications or in significant pain.   Increase activity slowly   Complete by: As directed    Lifting restrictions   Complete by: As directed    Strongly advised against any form of lifting greater than 15 pounds over the next 4 to 6 weeks.  This involves pushing/pulling movements as well.  After 4 weeks when may  gradually engage in more activities remaining aware of any new pain/tenderness elicited, and avoiding those for the full duration of 6 weeks.  Walking is encouraged.  Climbing stairs with caution.      Allergies as of 08/18/2023   No Known Allergies      Medication List     TAKE these medications    Aspirin  Low Dose 81 MG tablet Generic drug: aspirin  EC TAKE 1 TABLET BY MOUTH DAILY   buPROPion  150 MG 24 hr tablet Commonly known as: WELLBUTRIN  XL Take 150 mg by mouth daily.   Cyanocobalamin  1000 MCG/ML Kit Inject 1,000 mcg as directed every 14 (fourteen) days.   Docusate Sodium  100 MG capsule Take 100 mg by mouth daily.   lisinopril  10 MG tablet Commonly known as: ZESTRIL  Take 1 tablet (10 mg total) by mouth daily.   metFORMIN  500 MG tablet Commonly known as: GLUCOPHAGE  Take 1 tablet (500 mg total) by mouth 2 (two) times daily with a meal.   naproxen  500 MG tablet Commonly known as: NAPROSYN  Take 500 mg by mouth 2 (two) times daily as needed for mild pain.   rivastigmine 3 MG capsule Commonly known as: EXELON Take 3 mg by mouth 2 (two) times daily.        Follow-up Information     Flynn Hylan, MD. Schedule an appointment as soon as possible for a visit in 2 week(s).   Specialty: General Surgery Contact information: [address] [place] [address] [phone]  Signed: Flynn Hylan, M.D., [facility] 08/18/2023, 12:17 PM

## 2023-08-18 NOTE — Plan of Care (Signed)

## 2023-08-27 ENCOUNTER — Inpatient Hospital Stay: Admitting: Surgery

## 2024-01-18 IMAGING — CT CT HEAD W/O CM
3 of 4 series · 13 of 47 positions shown, 15 images · non-contrast
Comparison: 04/26/2014

CLINICAL DATA: Memory loss.



[Series 2: axial st head 5.00 ax · axial · 0.33mm/px · z∈[-460,-360]mm · 7 of 28 slices shown, 9 images]
[im 4/28  brain]
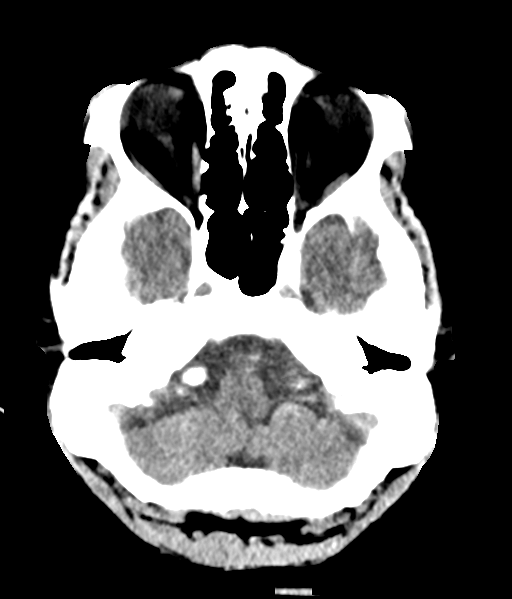
[im 4/28  bone]
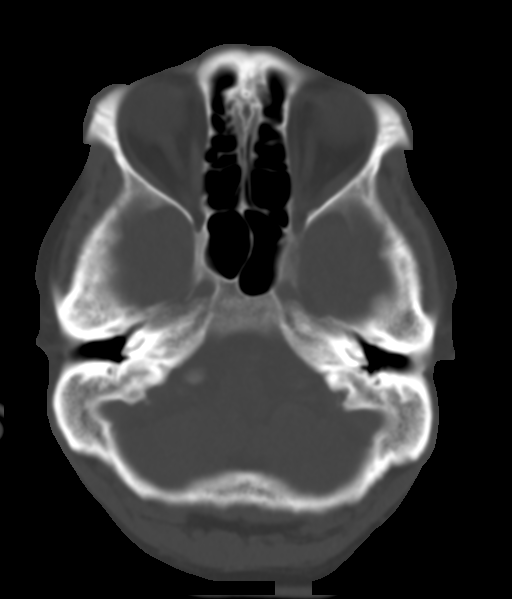
[im 7/28  brain]
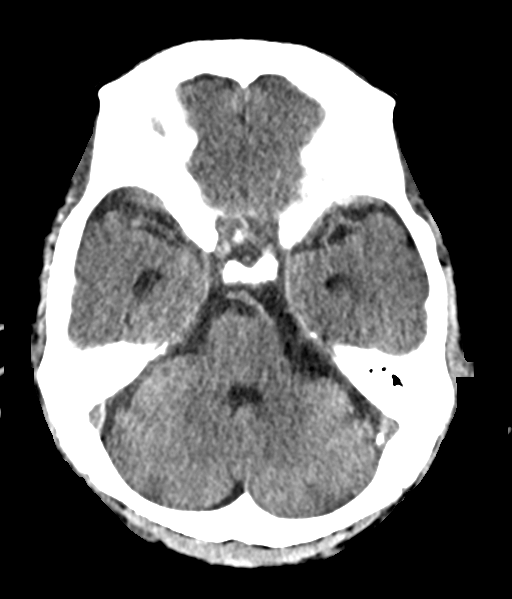
[im 11/28  brain]
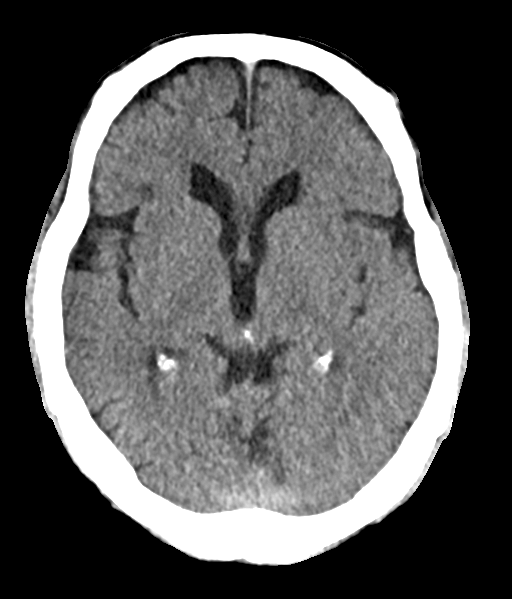
[im 14/28  brain]
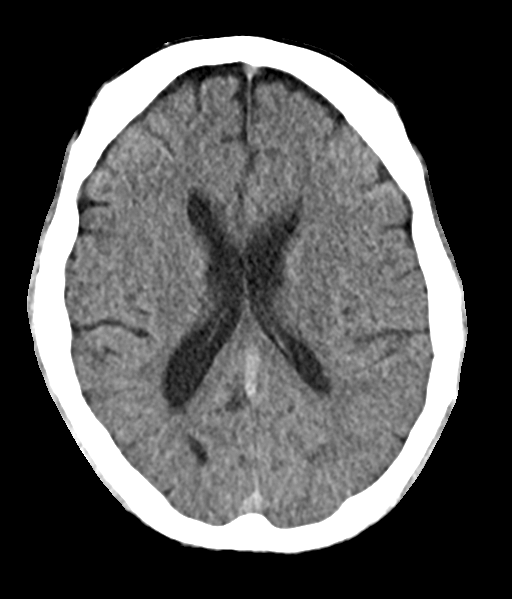
[im 17/28  brain]
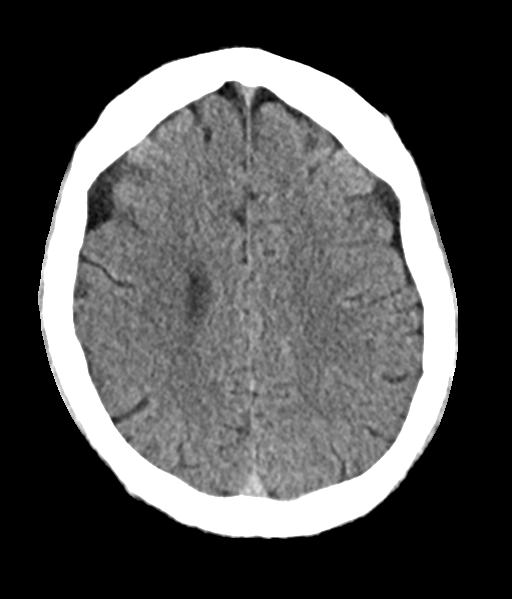
[im 17/28  bone]
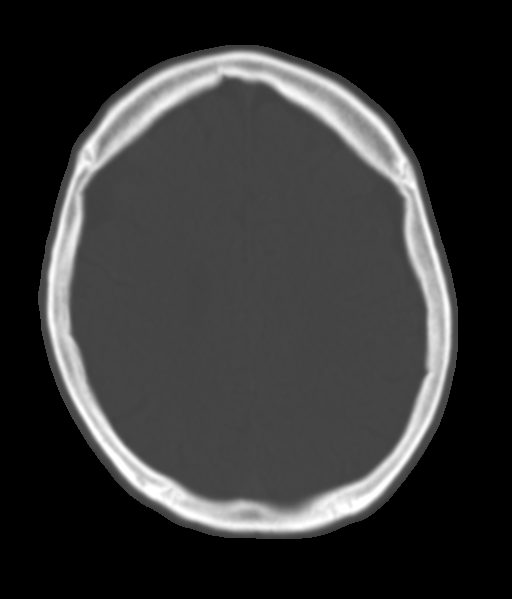
[im 21/28  brain]
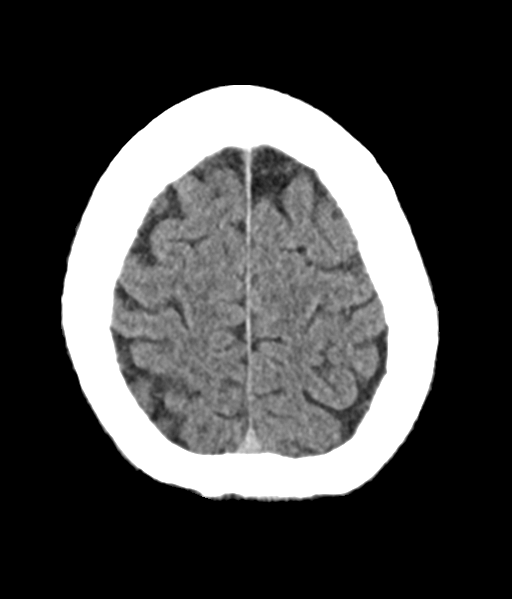
[im 24/28  brain]
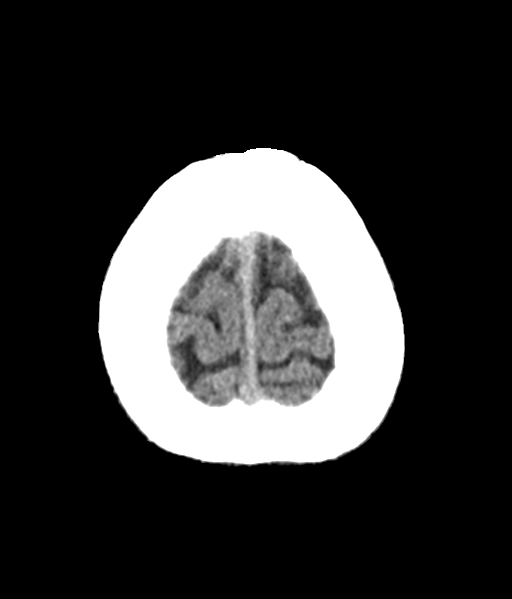

[Series 6: coronals head 3.00 cor · coronal · 0.28mm/px · 3 of 66 slices shown]
[im 22/66  brain]
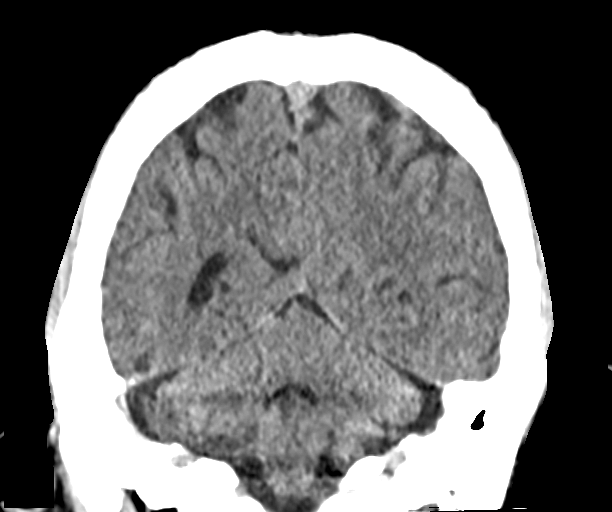
[im 29/66  brain]
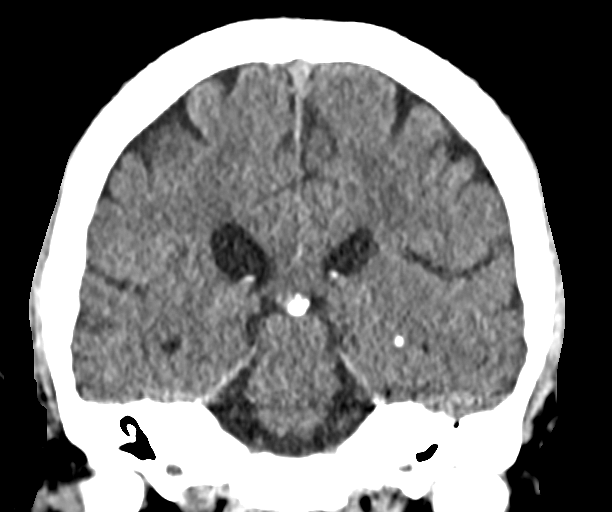
[im 37/66  brain]
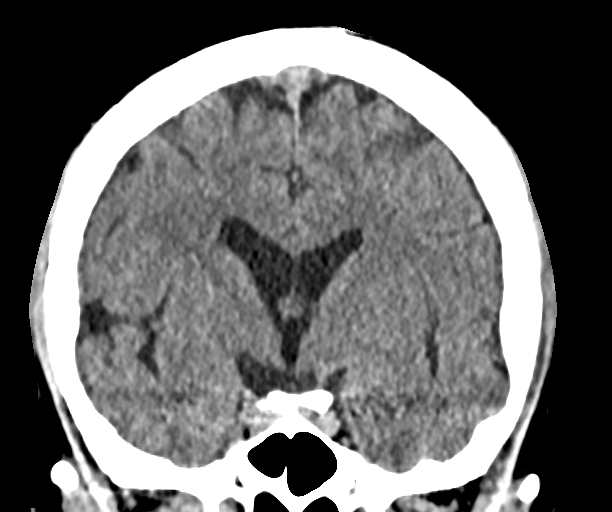

[Series 8: sagittals head 3.00 sag · sagittal · 0.28mm/px · 3 of 56 slices shown]
[im 19/56  brain]
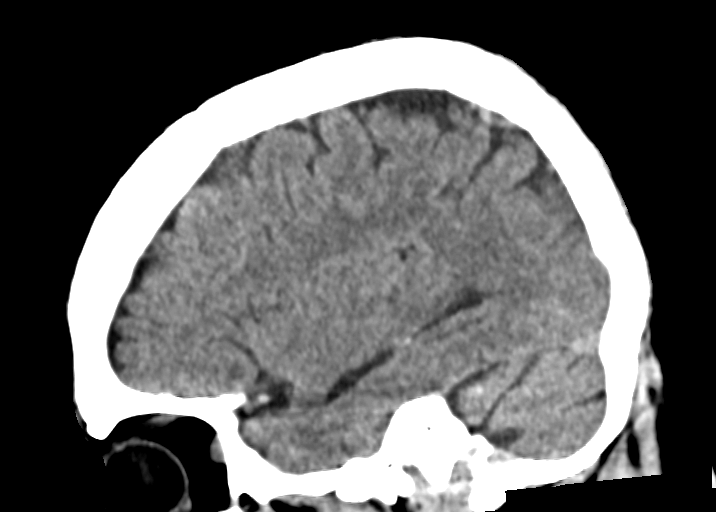
[im 28/56  brain]
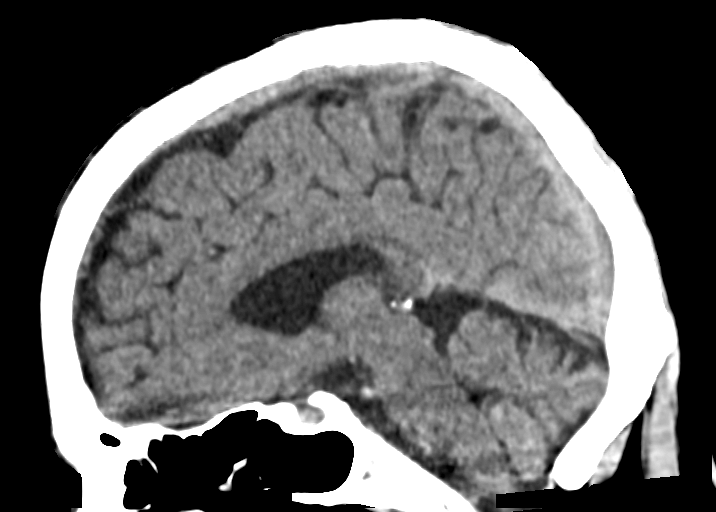
[im 37/56  brain]
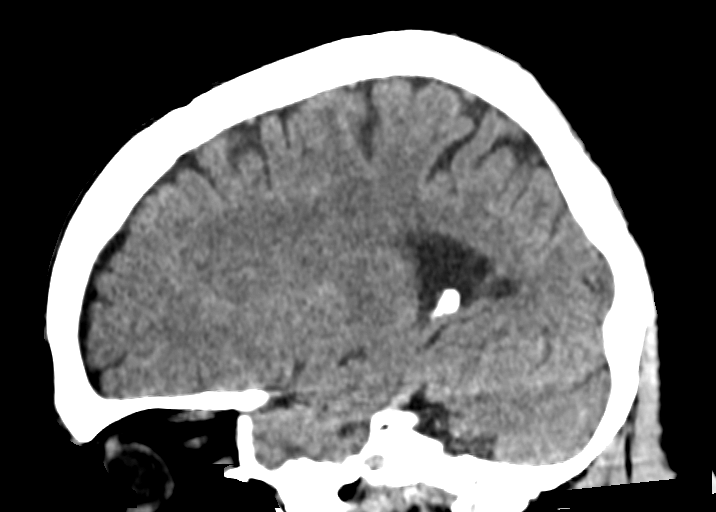

[13 of 47 positions shown; findings below may reference images not displayed]

FINDINGS: Brain: No evidence of acute infarction, hemorrhage, hydrocephalus,
extra-axial collection or mass lesion/mass effect.

Vascular: No hyperdense vessel or unexpected calcification.

Skull: No evidence for fracture. No worrisome lytic or sclerotic
lesion.

Sinuses/Orbits: The visualized paranasal sinuses and mastoid air
cells are clear. Visualized portions of the globes and intraorbital
fat are unremarkable.

Other: None.
IMPRESSION: No acute intracranial abnormality.
